# Patient Record
Sex: Male | Born: 1970 | Race: Black or African American | Hispanic: No | Marital: Single | Smoking: Never smoker
Health system: Southern US, Community
[De-identification: ages and names within clinical notes are randomized; demographics above are authoritative.]

## PROBLEM LIST (undated history)

## (undated) DIAGNOSIS — I420 Dilated cardiomyopathy: Secondary | ICD-10-CM

## (undated) DIAGNOSIS — E1169 Type 2 diabetes mellitus with other specified complication: Secondary | ICD-10-CM

## (undated) DIAGNOSIS — I10 Essential (primary) hypertension: Secondary | ICD-10-CM

## (undated) DIAGNOSIS — R0609 Other forms of dyspnea: Secondary | ICD-10-CM

## (undated) DIAGNOSIS — E669 Obesity, unspecified: Secondary | ICD-10-CM

## (undated) DIAGNOSIS — I509 Heart failure, unspecified: Secondary | ICD-10-CM

## (undated) DIAGNOSIS — R0600 Dyspnea, unspecified: Secondary | ICD-10-CM

## (undated) HISTORY — DX: Essential (primary) hypertension: I10

---

## 2004-07-14 ENCOUNTER — Ambulatory Visit: Payer: Self-pay | Admitting: Family Medicine

## 2004-07-16 ENCOUNTER — Ambulatory Visit: Payer: Self-pay | Admitting: Family Medicine

## 2005-12-01 ENCOUNTER — Emergency Department (HOSPITAL_COMMUNITY): Admission: EM | Admit: 2005-12-01 | Discharge: 2005-12-01 | Payer: Self-pay | Admitting: Emergency Medicine

## 2005-12-10 ENCOUNTER — Emergency Department (HOSPITAL_COMMUNITY): Admission: EM | Admit: 2005-12-10 | Discharge: 2005-12-10 | Payer: Self-pay | Admitting: Emergency Medicine

## 2008-07-22 ENCOUNTER — Emergency Department (HOSPITAL_COMMUNITY): Admission: EM | Admit: 2008-07-22 | Discharge: 2008-07-22 | Payer: Self-pay | Admitting: Emergency Medicine

## 2012-07-06 ENCOUNTER — Ambulatory Visit: Payer: Self-pay | Admitting: Family Medicine

## 2012-07-06 VITALS — BP 168/118 | HR 127 | Temp 98.0°F | Resp 18 | Ht 68.5 in | Wt 228.0 lb

## 2012-07-06 DIAGNOSIS — I10 Essential (primary) hypertension: Secondary | ICD-10-CM

## 2012-07-06 DIAGNOSIS — Z0000 Encounter for general adult medical examination without abnormal findings: Secondary | ICD-10-CM

## 2012-07-06 DIAGNOSIS — Z0289 Encounter for other administrative examinations: Secondary | ICD-10-CM

## 2012-07-06 MED ORDER — METOPROLOL SUCCINATE ER 50 MG PO TB24: 50.0000 mg | ORAL_TABLET | Freq: Every day | ORAL | Status: DC

## 2012-07-06 NOTE — Patient Instructions (Signed)

## 2012-07-06 NOTE — Progress Notes (Signed)
@[email or web address] @  Patient ID: Nicholas Knight MRN: [id number], DOB: [birth year] 41 y.o. Date of Encounter: 07/06/2012, 3:24 PM  Primary Physician: No primary provider on file.  Chief Complaint: Physical (CPE)  HPI: 41 y.o. y/o male with history noted below here for CPE.  Doing well. No issues/complaints.  Review of Systems: Consitutional: No fever, chills, fatigue, night sweats, lymphadenopathy, or weight changes. Eyes: No visual changes, eye redness, or discharge. ENT/Mouth: Ears: No otalgia, tinnitus, hearing loss, discharge. Nose: No congestion, rhinorrhea, sinus pain, or epistaxis. Throat: No sore throat, post nasal drip, or teeth pain. Cardiovascular: No CP, palpitations, diaphoresis, DOE, edema, orthopnea, PND. Respiratory: No cough, hemoptysis, SOB, or wheezing. Gastrointestinal: No anorexia, dysphagia, reflux, pain, nausea, vomiting, hematemesis, diarrhea, constipation, BRBPR, or melena. Genitourinary: No dysuria, frequency, urgency, hematuria, incontinence, nocturia, decreased urinary stream, discharge, impotence, or testicular pain/masses. Musculoskeletal: No decreased ROM, myalgias, stiffness, joint swelling, or weakness. Skin: No rash, erythema, lesion changes, pain, warmth, jaundice, or pruritis. Neurological: No headache, dizziness, syncope, seizures, tremors, memory loss, coordination problems, or paresthesias. Psychological: No anxiety, depression, hallucinations, SI/HI. Endocrine: No fatigue, polydipsia, polyphagia, polyuria, or known diabetes. All other systems were reviewed and are otherwise negative.  History reviewed. No pertinent past medical history.   History reviewed. No pertinent past surgical history.  Home Meds:  Prior to Admission medications   Not on File    Allergies:  Allergies  Allergen Reactions  . Penicillins Other (See Comments)    childhood    History   Social History  . Marital Status: Single    Spouse Name: N/A    Number of Children: N/A   . Years of Education: N/A   Occupational History  . Not on file.   Social History Main Topics  . Smoking status: Never Smoker   . Smokeless tobacco: Not on file  . Alcohol Use: No  . Drug Use: No  . Sexually Active: Yes   Other Topics Concern  . Not on file   Social History Narrative  . No narrative on file    Family History  Problem Relation Age of Onset  . Cancer Mother   . Lupus Maternal Grandmother     Physical Exam: Blood pressure 168/118, pulse 127, temperature 98 F (36.7 C), temperature source Oral, resp. rate 18, height 5' 8.5" (1.74 m), weight 228 lb (103.42 kg), SpO2 98.00%.  General: Well developed, well nourished, in no acute distress. HEENT: Normocephalic, atraumatic. Conjunctiva pink, sclera non-icteric. Pupils 2 mm constricting to 1 mm, round, regular, and equally reactive to light and accomodation. EOMI. Internal auditory canal clear. TMs with good cone of light and without pathology. Nasal mucosa pink. Nares are without discharge. No sinus tenderness. Oral mucosa pink. Dentition good. Pharynx without exudate.   Neck: Supple. Trachea midline. No thyromegaly. Full ROM. No lymphadenopathy. Lungs: Clear to auscultation bilaterally without wheezes, rales, or rhonchi. Breathing is of normal effort and unlabored. Cardiovascular: RRR with S1 S2. No murmurs, rubs, or gallops appreciated. Distal pulses 2+ symmetrically. No carotid or abdominal bruits Abdomen: Soft, non-tender, non-distended with normoactive bowel sounds. No hepatosplenomegaly or masses. No rebound/guarding. No CVA tenderness. Without hernias.   Genitourinary:  circumcised male. No penile lesions. Testes descended bilaterally, and smooth without tenderness or masses.  Musculoskeletal: Full range of motion and 5/5 strength throughout. Without swelling, atrophy, tenderness, crepitus, or warmth. Extremities without clubbing, cyanosis, or edema. Calves supple. Skin: Warm and moist without erythema,  ecchymosis, wounds, or rash. Neuro: A+Ox3. CN II-XII grossly intact. Moves all extremities  spontaneously. Full sensation throughout. Normal gait. DTR 2+ throughout upper and lower extremities. Finger to nose intact. Psych:  Responds to questions appropriately with a normal affect.    Assessment/Plan:  41 y.o. y/o  male here for CPE -Toprol XL 50 daily #30 with 3 RF  with bp recheck 24 hours.  Signed, Robyn Haber, MD 07/06/2012 3:24 PM

## 2012-07-07 ENCOUNTER — Ambulatory Visit (INDEPENDENT_AMBULATORY_CARE_PROVIDER_SITE_OTHER): Payer: Self-pay | Admitting: Family Medicine

## 2012-07-07 VITALS — BP 180/130 | HR 88 | Temp 98.6°F | Resp 18 | Wt 228.0 lb

## 2012-07-07 DIAGNOSIS — I10 Essential (primary) hypertension: Secondary | ICD-10-CM

## 2012-07-07 NOTE — Patient Instructions (Addendum)
bp recheck 150/100

## 2012-07-07 NOTE — Progress Notes (Signed)
Patient is taking toprol as directed (has taken 2 so far).  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  He will follow up in 3 weeks and continue to self monitor BP

## 2015-09-08 ENCOUNTER — Inpatient Hospital Stay (HOSPITAL_COMMUNITY)
Admission: EM | Admit: 2015-09-08 | Discharge: 2015-09-11 | DRG: 638 | Disposition: A | Discharge status: Home / Self Care | Payer: BLUE CROSS/BLUE SHIELD | Attending: Family Medicine | Admitting: Family Medicine

## 2015-09-08 ENCOUNTER — Encounter (HOSPITAL_COMMUNITY): Payer: Self-pay | Admitting: Emergency Medicine

## 2015-09-08 DIAGNOSIS — E871 Hypo-osmolality and hyponatremia: Secondary | ICD-10-CM | POA: Diagnosis present

## 2015-09-08 DIAGNOSIS — J029 Acute pharyngitis, unspecified: Secondary | ICD-10-CM | POA: Diagnosis present

## 2015-09-08 DIAGNOSIS — E875 Hyperkalemia: Secondary | ICD-10-CM | POA: Diagnosis present

## 2015-09-08 DIAGNOSIS — H538 Other visual disturbances: Secondary | ICD-10-CM | POA: Diagnosis not present

## 2015-09-08 DIAGNOSIS — N179 Acute kidney failure, unspecified: Secondary | ICD-10-CM | POA: Diagnosis present

## 2015-09-08 DIAGNOSIS — Z6831 Body mass index (BMI) 31.0-31.9, adult: Secondary | ICD-10-CM

## 2015-09-08 DIAGNOSIS — E860 Dehydration: Secondary | ICD-10-CM | POA: Diagnosis present

## 2015-09-08 DIAGNOSIS — Z23 Encounter for immunization: Secondary | ICD-10-CM

## 2015-09-08 DIAGNOSIS — I10 Essential (primary) hypertension: Secondary | ICD-10-CM | POA: Diagnosis present

## 2015-09-08 DIAGNOSIS — Z880 Allergy status to penicillin: Secondary | ICD-10-CM | POA: Diagnosis not present

## 2015-09-08 DIAGNOSIS — Z833 Family history of diabetes mellitus: Secondary | ICD-10-CM | POA: Diagnosis not present

## 2015-09-08 DIAGNOSIS — Z801 Family history of malignant neoplasm of trachea, bronchus and lung: Secondary | ICD-10-CM

## 2015-09-08 DIAGNOSIS — E1110 Type 2 diabetes mellitus with ketoacidosis without coma: Secondary | ICD-10-CM

## 2015-09-08 DIAGNOSIS — E1310 Other specified diabetes mellitus with ketoacidosis without coma: Secondary | ICD-10-CM | POA: Diagnosis present

## 2015-09-08 LAB — COMPREHENSIVE METABOLIC PANEL
ALT: 41 U/L (ref 17–63)
AST: 24 U/L (ref 15–41)
Albumin: 4.1 g/dL (ref 3.5–5.0)
Alkaline Phosphatase: 110 U/L (ref 38–126)
Anion gap: 17 — ABNORMAL HIGH (ref 5–15)
BUN: 32 mg/dL — ABNORMAL HIGH (ref 6–20)
CHLORIDE: 83 mmol/L — AB (ref 101–111)
CO2: 24 mmol/L (ref 22–32)
Calcium: 9 mg/dL (ref 8.9–10.3)
Creatinine, Ser: 2.22 mg/dL — ABNORMAL HIGH (ref 0.61–1.24)
GFR, EST AFRICAN AMERICAN: 39 mL/min — AB (ref >60)
GFR, EST NON AFRICAN AMERICAN: 34 mL/min — AB (ref >60)
Glucose, Bld: 1144 mg/dL (ref 65–99)
POTASSIUM: 5.9 mmol/L — AB (ref 3.5–5.1)
SODIUM: 124 mmol/L — AB (ref 135–145)
Total Bilirubin: 1.5 mg/dL — ABNORMAL HIGH (ref 0.3–1.2)
Total Protein: 7.5 g/dL (ref 6.5–8.1)

## 2015-09-08 LAB — URINALYSIS, ROUTINE W REFLEX MICROSCOPIC
BILIRUBIN URINE: NEGATIVE
Glucose, UA: >1000 mg/dL — AB
Ketones, ur: 15 mg/dL — AB
Leukocytes, UA: NEGATIVE
NITRITE: NEGATIVE
PROTEIN: NEGATIVE mg/dL
Specific Gravity, Urine: 1.01 (ref 1.005–1.030)
pH: 5.5 (ref 5.0–8.0)

## 2015-09-08 LAB — BASIC METABOLIC PANEL
Anion gap: 13 (ref 5–15)
BUN: 27 mg/dL — AB (ref 6–20)
CO2: 32 mmol/L (ref 22–32)
Calcium: 9.6 mg/dL (ref 8.9–10.3)
Chloride: 99 mmol/L — ABNORMAL LOW (ref 101–111)
Creatinine, Ser: 1.82 mg/dL — ABNORMAL HIGH (ref 0.61–1.24)
GFR calc non Af Amer: 43 mL/min — ABNORMAL LOW (ref >60)
GFR, EST AFRICAN AMERICAN: 50 mL/min — AB (ref >60)
GLUCOSE: 310 mg/dL — AB (ref 65–99)
POTASSIUM: 4 mmol/L (ref 3.5–5.1)
SODIUM: 144 mmol/L (ref 135–145)

## 2015-09-08 LAB — I-STAT VENOUS BLOOD GAS, ED
ACID-BASE DEFICIT: 2 mmol/L (ref 0.0–2.0)
Bicarbonate: 25.2 mEq/L — ABNORMAL HIGH (ref 20.0–24.0)
O2 Saturation: 83 %
TCO2: 27 mmol/L (ref 0–100)
pCO2, Ven: 50.6 mmHg — ABNORMAL HIGH (ref 45.0–50.0)
pH, Ven: 7.306 — ABNORMAL HIGH (ref 7.250–7.300)
pO2, Ven: 53 mmHg — ABNORMAL HIGH (ref 30.0–45.0)

## 2015-09-08 LAB — URINE MICROSCOPIC-ADD ON
BACTERIA UA: NONE SEEN
RBC / HPF: NONE SEEN RBC/hpf (ref 0–5)

## 2015-09-08 LAB — I-STAT CHEM 8, ED
BUN: 35 mg/dL — ABNORMAL HIGH (ref 6–20)
CHLORIDE: 86 mmol/L — AB (ref 101–111)
Calcium, Ion: 1 mmol/L — ABNORMAL LOW (ref 1.12–1.23)
Creatinine, Ser: 1.8 mg/dL — ABNORMAL HIGH (ref 0.61–1.24)
Glucose, Bld: >700 mg/dL (ref 65–99)
HEMATOCRIT: 57 % — AB (ref 39.0–52.0)
Hemoglobin: 19.4 g/dL — ABNORMAL HIGH (ref 13.0–17.0)
POTASSIUM: 5.7 mmol/L — AB (ref 3.5–5.1)
SODIUM: 124 mmol/L — AB (ref 135–145)
TCO2: 24 mmol/L (ref 0–100)

## 2015-09-08 LAB — CBC
HCT: 49 % (ref 39.0–52.0)
Hemoglobin: 17.6 g/dL — ABNORMAL HIGH (ref 13.0–17.0)
MCH: 27.7 pg (ref 26.0–34.0)
MCHC: 35.9 g/dL (ref 30.0–36.0)
MCV: 77.2 fL — AB (ref 78.0–100.0)
PLATELETS: 238 10*3/uL (ref 150–400)
RBC: 6.35 MIL/uL — ABNORMAL HIGH (ref 4.22–5.81)
WBC: 8.8 10*3/uL (ref 4.0–10.5)

## 2015-09-08 LAB — CBG MONITORING, ED
Glucose-Capillary: >600 mg/dL (ref 65–99)
Glucose-Capillary: >600 mg/dL (ref 65–99)

## 2015-09-08 LAB — GLUCOSE, CAPILLARY
GLUCOSE-CAPILLARY: 277 mg/dL — AB (ref 65–99)
GLUCOSE-CAPILLARY: 392 mg/dL — AB (ref 65–99)
Glucose-Capillary: 487 mg/dL — ABNORMAL HIGH (ref 65–99)

## 2015-09-08 MED ORDER — SODIUM CHLORIDE 0.9 % IV BOLUS (SEPSIS): 1000.0000 mL | Freq: Once | INTRAVENOUS | Status: AC

## 2015-09-08 MED ORDER — SODIUM CHLORIDE 0.9 % IV SOLN: INTRAVENOUS | Status: AC

## 2015-09-08 MED ORDER — DEXTROSE-NACL 5-0.45 % IV SOLN
INTRAVENOUS | Status: DC
  Administered 2015-09-08: 22:00:00 via INTRAVENOUS

## 2015-09-08 MED ORDER — POTASSIUM CHLORIDE CRYS ER 20 MEQ PO TBCR
40.0000 meq | EXTENDED_RELEASE_TABLET | Freq: Once | ORAL | Status: AC
  Administered 2015-09-09: 40 meq via ORAL
  Filled 2015-09-08: qty 2

## 2015-09-08 MED ORDER — INSULIN ASPART 100 UNIT/ML ~~LOC~~ SOLN
10.0000 [IU] | Freq: Once | SUBCUTANEOUS | Status: AC
  Administered 2015-09-08: 10 [IU] via INTRAVENOUS
  Filled 2015-09-08: qty 1

## 2015-09-08 MED ORDER — HEPARIN SODIUM (PORCINE) 5000 UNIT/ML IJ SOLN
5000.0000 [IU] | Freq: Three times a day (TID) | INTRAMUSCULAR | Status: DC
  Administered 2015-09-08 – 2015-09-11 (×7): 5000 [IU] via SUBCUTANEOUS
  Filled 2015-09-08 (×7): qty 1

## 2015-09-08 MED ORDER — INSULIN REGULAR HUMAN 100 UNIT/ML IJ SOLN
INTRAMUSCULAR | Status: DC
  Administered 2015-09-08: 12.8 [IU]/h via INTRAVENOUS
  Filled 2015-09-08: qty 2.5

## 2015-09-08 MED ORDER — SODIUM CHLORIDE 0.9 % IV BOLUS (SEPSIS)
2000.0000 mL | Freq: Once | INTRAVENOUS | Status: AC
  Administered 2015-09-08: 2000 mL via INTRAVENOUS

## 2015-09-08 MED ORDER — INSULIN GLARGINE 100 UNIT/ML ~~LOC~~ SOLN
10.0000 [IU] | Freq: Every day | SUBCUTANEOUS | Status: DC
  Administered 2015-09-09: 10 [IU] via SUBCUTANEOUS
  Filled 2015-09-08 (×2): qty 0.1

## 2015-09-08 MED ORDER — INSULIN ASPART 100 UNIT/ML ~~LOC~~ SOLN
0.0000 [IU] | SUBCUTANEOUS | Status: DC
  Administered 2015-09-09 (×3): 9 [IU] via SUBCUTANEOUS

## 2015-09-08 MED ORDER — INSULIN REGULAR HUMAN 100 UNIT/ML IJ SOLN
INTRAMUSCULAR | Status: DC
  Administered 2015-09-08: 5.4 [IU]/h via INTRAVENOUS
  Filled 2015-09-08: qty 2.5

## 2015-09-08 MED ORDER — SODIUM CHLORIDE 0.9 % IV SOLN
INTRAVENOUS | Status: DC
  Administered 2015-09-08: 20:00:00 via INTRAVENOUS

## 2015-09-08 NOTE — ED Notes (Signed)
Istat venous blood gas PH   7.306 PCO2 50.6 P02 53 BE,B -2 HCO3 25.2 TCO2 27 sO2 83%

## 2015-09-08 NOTE — H&P (Signed)
[facility] Admission History and Physical Service Pager: [phone]  Patient name: Nicholas Knight Medical record number: [id number] Date of birth: 12-27-1970 Age: 45 y.o. Gender: male  Primary Care Provider: No primary care provider on file. Consultants: None Code Status: FULL  Chief Complaint: Blurry vision  Assessment and Plan: Nicholas Knight is a 45 y.o. male presenting with hyperglycemia, found to be in mild DKA. PMH is significant for HTN.   1. DKA: newly diagnosed Type II DM. Initial CBG 1144, anion gap 17, pH 7.3. Also with hyponatremia (Na 124) and hyperkalemia (5.9). Denies recent illness or any other potentially precipitating factors. A&Ox3. Still with some blurry vision but no symptoms otherwise.  - Admit to stepdown for monitoring, attending Dr. Erin Hearing - Continue insulin drip via glucomander; will transition to subq insulin when able - Repeat CBG, labs via DKA protocol - F/u A1C - trend BMPs  2. AKI: likely 2/2 dehydration from osmotic diuresis. Cr 2.2 on admission. No prior values for comparison.  - Cont IVF hydration - AM BMP  3. HTN: initially treated with metoprolol, then lisinopril, although has not taken medication in at least three months. Normotensive in ED.  - Monitor BP  FEN/GI: NPO until transitioned off insulin drip; NS@125  mL/hr Prophylaxis: subQ heparin  Disposition: admit to stepdown   History of Present Illness:  Nicholas Knight is a 45 y.o. male presenting with dizziness and weakness, found to be in DKA.   Patient reports that for the past 5 days, he has felt dizzy and has been extremely thirsty. He also endorses increased urinary frequency, decreased appetite, and blurry vision. His dizziness worsened today, so he decided to go to urgent care. At urgent care, he was found to have a blood glucose too high to be measured, so he was sent to [facility] for further work-up. He has no prior diagnosis of diabetes. Report some  rhinorrhea in the past few days but denies cough or fever.    In [facility] ED, patient was found to be in DKA. He was subsequently admitted for further management.   Patient has history of HTN although reports he has not taken any medication for this in at least three months. He has not been seen by a PCP in the past three years.  Review Of Systems: Per HPI with the following additions: no nausea, vomiting, diarrhea, constipation, headache, numbness, tingling; endorses 10 pound weight loss.  Otherwise the remainder of the systems were negative.  Patient Active Problem List   Diagnosis Date Noted  . DKA (diabetic ketoacidosis) ([place]) 09/08/2015  . Hypertension 07/07/2012   Past Medical History: Past Medical History  Diagnosis Date  . Hypertension     Past Surgical History: History reviewed. No pertinent past surgical history.  Social History: Social History  Substance Use Topics  . Smoking status: Never Smoker   . Smokeless tobacco: None  . Alcohol Use: No   Please also refer to relevant sections of EMR.  Family History: Family History  Problem Relation Age of Onset  . Cancer Mother   . Lupus Maternal Grandmother    Type II DM - mother, father, younger brother MI - father (age unknown) Lung cancer - mother (former smoker)  Allergies and Medications: Allergies  Allergen Reactions  . Penicillins Other (See Comments)    childhood   No current facility-administered medications on file prior to encounter.   No current outpatient prescriptions on file prior to encounter.    Objective: BP 123/84 mmHg  Pulse 100  Temp(Src) 97.8 F (36.6 C) (Oral)  Resp 18  Ht 5\' 7"  (1.702 m)  Wt 200 lb (90.719 kg)  BMI 31.32 kg/m2  SpO2 91% Exam: General: well-appearing, well-nourished; resting comfortably in bed in NAD; family member at bedside Eyes: PERRLA ENTM: dry mouth, no oropharyngeal erythema or exudates Neck: supple, FROM, no lymphadenopathy Cardiovascular: RRR, no  murmurs appreciated Respiratory: CTAB, normal work of breathing, no wheezes or rhonchi Abdomen: soft, non-tender, non-distended, +BS MSK: WWP, 5/5 strength upper and lower extremities bilaterally, DP pulses present bilaterally Skin: dry flaking skin on both feet but no ulcers or rashes noted Neuro: A&Ox3, CN II-XII grossly intact Psych: Appropriate mood and affect  Labs and Imaging: CBC BMET   Recent Labs Lab 09/08/15 1325 09/08/15 1330  WBC 8.8  --   HGB 17.6* 19.4*  HCT 49.0 57.0*  PLT 238  --     Recent Labs Lab 09/08/15 1325 09/08/15 1330  NA 124* 124*  K 5.9* 5.7*  CL 83* 86*  CO2 24  --   BUN 32* 35*  CREATININE 2.22* 1.80*  GLUCOSE 1144* >700*  CALCIUM 9.0  --       Recent Labs Lab 09/08/15 1317 09/08/15 1545 09/08/15 1651 09/08/15 1830 09/08/15 [place] >600* >600* >600* >600* >600*     Verner Mould, MD 09/08/2015, 5:52 PM PGY-1, [place] [facility] pager: [phone], text pages welcome  I have read and agree with the amended note as above.  Phill Myron, MD, PGY-3 8:06 PM

## 2015-09-08 NOTE — ED Notes (Signed)
Attempted report 

## 2015-09-08 NOTE — ED Notes (Signed)
Pt sent here from Urgent care with ketones in urine and CBG was high--- has been drinking a lot of water, urinating more, lightheaded.

## 2015-09-09 LAB — BASIC METABOLIC PANEL
ANION GAP: 12 (ref 5–15)
ANION GAP: 13 (ref 5–15)
ANION GAP: 12 (ref 5–15)
BUN: 24 mg/dL — ABNORMAL HIGH (ref 6–20)
BUN: 23 mg/dL — ABNORMAL HIGH (ref 6–20)
BUN: 25 mg/dL — AB (ref 6–20)
CALCIUM: 8.6 mg/dL — AB (ref 8.9–10.3)
CO2: 32 mmol/L (ref 22–32)
CO2: 28 mmol/L (ref 22–32)
CO2: 23 mmol/L (ref 22–32)
Calcium: 9.4 mg/dL (ref 8.9–10.3)
Calcium: 8.6 mg/dL — ABNORMAL LOW (ref 8.9–10.3)
Chloride: 100 mmol/L — ABNORMAL LOW (ref 101–111)
Chloride: 99 mmol/L — ABNORMAL LOW (ref 101–111)
Chloride: 99 mmol/L — ABNORMAL LOW (ref 101–111)
Creatinine, Ser: 1.58 mg/dL — ABNORMAL HIGH (ref 0.61–1.24)
Creatinine, Ser: 1.65 mg/dL — ABNORMAL HIGH (ref 0.61–1.24)
Creatinine, Ser: 1.6 mg/dL — ABNORMAL HIGH (ref 0.61–1.24)
GFR calc Af Amer: 58 mL/min — ABNORMAL LOW (ref >60)
GFR, EST AFRICAN AMERICAN: 59 mL/min — AB (ref >60)
GFR, EST AFRICAN AMERICAN: 56 mL/min — AB (ref >60)
GFR, EST NON AFRICAN AMERICAN: 51 mL/min — AB (ref >60)
GFR, EST NON AFRICAN AMERICAN: 48 mL/min — AB (ref >60)
GFR, EST NON AFRICAN AMERICAN: 50 mL/min — AB (ref >60)
GLUCOSE: 151 mg/dL — AB (ref 65–99)
GLUCOSE: 438 mg/dL — AB (ref 65–99)
Glucose, Bld: 362 mg/dL — ABNORMAL HIGH (ref 65–99)
POTASSIUM: 3.8 mmol/L (ref 3.5–5.1)
POTASSIUM: 5.3 mmol/L — AB (ref 3.5–5.1)
Potassium: 4.7 mmol/L (ref 3.5–5.1)
SODIUM: 144 mmol/L (ref 135–145)
SODIUM: 140 mmol/L (ref 135–145)
Sodium: 134 mmol/L — ABNORMAL LOW (ref 135–145)

## 2015-09-09 LAB — GLUCOSE, CAPILLARY
GLUCOSE-CAPILLARY: 199 mg/dL — AB (ref 65–99)
GLUCOSE-CAPILLARY: 327 mg/dL — AB (ref 65–99)
GLUCOSE-CAPILLARY: 356 mg/dL — AB (ref 65–99)
GLUCOSE-CAPILLARY: 402 mg/dL — AB (ref 65–99)
Glucose-Capillary: 162 mg/dL — ABNORMAL HIGH (ref 65–99)
Glucose-Capillary: 416 mg/dL — ABNORMAL HIGH (ref 65–99)
Glucose-Capillary: 451 mg/dL — ABNORMAL HIGH (ref 65–99)

## 2015-09-09 LAB — MRSA PCR SCREENING: MRSA BY PCR: NEGATIVE

## 2015-09-09 MED ORDER — INSULIN GLARGINE 100 UNIT/ML ~~LOC~~ SOLN
20.0000 [IU] | Freq: Every day | SUBCUTANEOUS | Status: DC
  Administered 2015-09-09: 20 [IU] via SUBCUTANEOUS
  Filled 2015-09-09 (×3): qty 0.2

## 2015-09-09 MED ORDER — LIVING WELL WITH DIABETES BOOK
Freq: Once | Status: AC
  Administered 2015-09-09: 09:00:00
  Filled 2015-09-09: qty 1

## 2015-09-09 MED ORDER — INSULIN GLARGINE 100 UNIT/ML ~~LOC~~ SOLN
10.0000 [IU] | Freq: Once | SUBCUTANEOUS | Status: AC
  Administered 2015-09-09: 10 [IU] via SUBCUTANEOUS
  Filled 2015-09-09: qty 0.1

## 2015-09-09 MED ORDER — SODIUM CHLORIDE 0.9 % IV SOLN
INTRAVENOUS | Status: DC
  Administered 2015-09-09 – 2015-09-10 (×3): via INTRAVENOUS
  Filled 2015-09-09: qty 1000

## 2015-09-09 MED ORDER — PNEUMOCOCCAL VAC POLYVALENT 25 MCG/0.5ML IJ INJ
0.5000 mL | INJECTION | INTRAMUSCULAR | Status: AC
  Administered 2015-09-10: 0.5 mL via INTRAMUSCULAR
  Filled 2015-09-09: qty 0.5

## 2015-09-09 MED ORDER — SODIUM CHLORIDE 0.9 % IV BOLUS (SEPSIS)
1000.0000 mL | Freq: Once | INTRAVENOUS | Status: AC
  Administered 2015-09-09: 1000 mL via INTRAVENOUS

## 2015-09-09 MED ORDER — INSULIN ASPART 100 UNIT/ML ~~LOC~~ SOLN
0.0000 [IU] | Freq: Three times a day (TID) | SUBCUTANEOUS | Status: DC
  Administered 2015-09-09: 15 [IU] via SUBCUTANEOUS
  Administered 2015-09-10: 5 [IU] via SUBCUTANEOUS

## 2015-09-09 MED ORDER — METFORMIN HCL 500 MG PO TABS
500.0000 mg | ORAL_TABLET | Freq: Two times a day (BID) | ORAL | Status: DC
  Administered 2015-09-09: 500 mg via ORAL
  Filled 2015-09-09: qty 1

## 2015-09-09 MED ORDER — INSULIN GLARGINE 100 UNIT/ML ~~LOC~~ SOLN
5.0000 [IU] | Freq: Once | SUBCUTANEOUS | Status: DC
  Filled 2015-09-09: qty 0.05

## 2015-09-09 MED ORDER — INFLUENZA VAC SPLIT QUAD 0.5 ML IM SUSY
0.5000 mL | PREFILLED_SYRINGE | INTRAMUSCULAR | Status: AC
  Administered 2015-09-10: 0.5 mL via INTRAMUSCULAR
  Filled 2015-09-09: qty 0.5

## 2015-09-09 NOTE — Progress Notes (Addendum)
Inpatient Diabetes Program Recommendations  AACE/ADA: New Consensus Statement on Inpatient Glycemic Control (2015)  Target Ranges:  Prepandial:   less than 140 mg/dL      Peak postprandial:   less than 180 mg/dL (1-2 hours)      Critically ill patients:  140 - 180 mg/dL   Results for MAHD, CLICK (MRN [id number]) as of 09/09/2015 08:19  Ref. Range 09/09/2015 00:37 09/09/2015 01:39 09/09/2015 03:26 09/09/2015 07:28  Glucose-Capillary Latest Ref Range: 65-99 mg/dL 199 (H) 162 (H) 327 (H) 356 (H)   Review of Glycemic Control  Diabetes history: None Current orders for Inpatient glycemic control: Lantus 10 units, Novolog Sensitive Q4hrs  Inpatient Diabetes Program Recommendations:  Note patient transitioned off insulin gtt around midnight. Patient did not overlap insulin gtt with administration of basal insulin long enough. Patient was still requiring several units of insulin an hour on the gtt even though labs looked ok. Insulin - Basal: Fasting 356 mg/dl, please consider increasing basal insulin to at least Lantus 20 units Q24 hrs. may need additional units given this am.  Awaiting A1c to see what patient will need to be discharged on insulin vs.orals and to pinpoint DM teaching. Orders placed for RN to start DM educational videos and teaching DM survival skills for discharge: s/s hypoglycemia and treatment s/s hyperglycemia and treatment A1c results, goal is 7% or less (150 avg glucose) New medications they will be on Check glucose and how often (1-2 times for oral meds, 3-4 times a day with insulin) Teach how to administer insulin with each insulin administration dose scheduled Watching carb intake ( 60-75 g/meal, 15-30 g/snack), plate method, watching beverage options.  Thanks,  Tama Headings RN, MSN, [facility] Inpatient Diabetes Coordinator Team Pager [phone] (8a-5p)

## 2015-09-09 NOTE — Progress Notes (Signed)
NURSING PROGRESS NOTE  Nicholas Knight [id number] Transfer Data: 09/09/2015 12:00 PM Attending Provider: Lind Covert, MD PCP:No primary care provider on file. Code Status: FULL  Nicholas Knight is a 45 y.o. male patient transferred from [name]  -No acute distress noted.  -No complaints of shortness of breath.  -No complaints of chest pain.   Cardiac Monitoring: Box # 12 in place.   Blood pressure 132/63, pulse 115, temperature 98.4 F (36.9 C), temperature source Oral, resp. rate 19, height 5\' 7"  (1.702 m), weight 90.719 kg (200 lb), SpO2 97 %.   IV Fluids:  IV in place, SL Allergies:  Penicillins  Past Medical History:   has a past medical history of Hypertension.  Past Surgical History:   has no past surgical history on file.  Social History:   reports that he has never smoked. He does not have any smokeless tobacco history on file. He reports that he does not drink alcohol or use illicit drugs.  Skin: Intact  Patient/Family orientated to room. Information packet given to patient/family. Admission inpatient armband information verified with patient/family to include name and date of birth and placed on patient arm. Side rails up x 2, fall assessment and education completed with patient/family. Patient/family able to verbalize understanding of risk associated with falls and verbalized understanding to call for assistance before getting out of bed. Call light within reach. Patient/family able to voice and demonstrate understanding of unit orientation instructions.    Will continue to evaluate and treat per MD orders.

## 2015-09-09 NOTE — Plan of Care (Signed)
Problem: Food- and Nutrition-Related Knowledge Deficit (NB-1.1) Goal: Nutrition education Formal process to instruct or train a patient/client in a skill or to impart knowledge to help patients/clients voluntarily manage or modify food choices and eating behavior to maintain or improve health. Outcome: Completed/Met Date Met:  09/09/15  RD consulted for nutrition education regarding diabetes.   No results found for: HGBA1C  RD provided "Carbohydrate Counting for People with Diabetes" handout from the Academy of Nutrition and Dietetics. Discussed different food groups and their effects on blood sugar, emphasizing carbohydrate-containing foods. Provided list of carbohydrates and recommended serving sizes of common foods.  Discussed importance of controlled and consistent carbohydrate intake throughout the day. Provided examples of ways to balance meals/snacks and encouraged intake of high-fiber, whole grain complex carbohydrates. Teach back method used.  Expect good compliance. [facility]  Body mass index is 31.32 kg/(m^2). Pt meets criteria for obesity based on current BMI.  Current diet order is CHO modified, patient is consuming approximately 100% of meals at this time. Labs and medications reviewed. No further nutrition interventions warranted at this time. RD contact information provided. If additional nutrition issues arise, please re-consult RD.  Molli Barrows, RD, LDN, [place] Pager [phone] After Hours Pager [phone]

## 2015-09-09 NOTE — Care Management Note (Signed)
Case Management Note  Patient Details  Name: Nicholas Knight MRN: [id number] Date of Birth: 23-Jun-1971  Subjective/Objective:   Patient is from home with spouse, NCM awaiting benefit check for lantus and levemir to see if covered ,if so what is co pay ,patient transferred to floor,will inform NCM .                 Action/Plan: DKA, AKI, HTN, Blurry Vision, patient for possible dc today, awaiting benefit check.  Expected Discharge Date:                  Expected Discharge Plan:  Home/Self Care  In-House Referral:     Discharge planning Services  CM Consult  Post Acute Care Choice:    Choice offered to:     DME Arranged:    DME Agency:     HH Arranged:    HH Agency:     Status of Service:  In process, will continue to follow  Medicare Important Message Given:    Date Medicare IM Given:    Medicare IM give by:    Date Additional Medicare IM Given:    Additional Medicare Important Message give by:     If discussed at [place] of Stay Meetings, dates discussed:    Additional Comments:  Zenon Mayo, RN 09/09/2015, 11:07 AM

## 2015-09-09 NOTE — ED Provider Notes (Signed)
CSN: [id number]     Arrival date & time 09/08/15  1221 History   First MD Initiated Contact with Patient 09/08/15 1405     Chief Complaint  Patient presents with  . Hyperglycemia      HPI Patient presents with a 3 to four-day history of increasing thirst and urination.  Patient has no history of diabetes mellitus.  Patient has several members of his family with diabetes.  Patient has no significant medical history other than hypertension.  Patient denies abdominal pain nausea vomiting or fever. Past Medical History  Diagnosis Date  . Hypertension    History reviewed. No pertinent past surgical history. Family History  Problem Relation Age of Onset  . Cancer Mother   . Lupus Maternal Grandmother    Social History  Substance Use Topics  . Smoking status: Never Smoker   . Smokeless tobacco: None  . Alcohol Use: No    Review of Systems  Unable to perform ROS: Acuity of condition      Allergies  Penicillins  Home Medications   Prior to Admission medications   Not on File   BP 143/88 mmHg  Pulse 95  Temp(Src) 98.6 F (37 C) (Oral)  Resp 18  Ht 5\' 7"  (1.702 m)  Wt 200 lb (90.719 kg)  BMI 31.32 kg/m2  SpO2 94% Physical Exam  Constitutional: He is oriented to person, place, and time. He appears well-developed and well-nourished. No distress.  HENT:  Head: Normocephalic and atraumatic.  Eyes: Pupils are equal, round, and reactive to light.  Neck: Normal range of motion.  Cardiovascular: Intact distal pulses.  Tachycardia present.   Pulmonary/Chest: No respiratory distress.  Abdominal: Normal appearance. He exhibits no distension. There is no tenderness. There is no rebound.  Musculoskeletal: Normal range of motion.  Neurological: He is alert and oriented to person, place, and time. No cranial nerve deficit.  Skin: Skin is warm and dry. No rash noted.  Psychiatric: He has a normal mood and affect. His behavior is normal.  Nursing note and vitals reviewed.   ED  Course  Procedures (including critical care time) CRITICAL CARE Performed by: Leonard Schwartz L Total critical care time: 30 minutes Critical care time was exclusive of separately billable procedures and treating other patients. Critical care was necessary to treat or prevent imminent or life-threatening deterioration. Critical care was time spent personally by me on the following activities: development of treatment plan with patient and/or surrogate as well as nursing, discussions with consultants, evaluation of patient's response to treatment, examination of patient, obtaining history from patient or surrogate, ordering and performing treatments and interventions, ordering and review of laboratory studies, ordering and review of radiographic studies, pulse oximetry and re-evaluation of patient's condition.  Labs Review   Results for orders placed or performed during the hospital encounter of 09/08/15  MRSA PCR Screening  Result Value Ref Range   MRSA by PCR NEGATIVE NEGATIVE  CBC  Result Value Ref Range   WBC 8.8 4.0 - 10.5 K/uL   RBC 6.35 (H) 4.22 - 5.81 MIL/uL   Hemoglobin 17.6 (H) 13.0 - 17.0 g/dL   HCT 49.0 39.0 - 52.0 %   MCV 77.2 (L) 78.0 - 100.0 fL   MCH 27.7 26.0 - 34.0 pg   MCHC 35.9 30.0 - 36.0 g/dL   Platelets 238 150 - 400 K/uL  Urinalysis, Routine w reflex microscopic (not at [facility])  Result Value Ref Range   Color, Urine COLORLESS (A) YELLOW   APPearance CLEAR CLEAR  Specific Gravity, Urine 1.010 1.005 - 1.030   pH 5.5 5.0 - 8.0   Glucose, UA >1000 (A) NEGATIVE mg/dL   Hgb urine dipstick TRACE (A) NEGATIVE   Bilirubin Urine NEGATIVE NEGATIVE   Ketones, ur 15 (A) NEGATIVE mg/dL   Protein, ur NEGATIVE NEGATIVE mg/dL   Nitrite NEGATIVE NEGATIVE   Leukocytes, UA NEGATIVE NEGATIVE  Comprehensive metabolic panel  Result Value Ref Range   Sodium 124 (L) 135 - 145 mmol/L   Potassium 5.9 (H) 3.5 - 5.1 mmol/L   Chloride 83 (L) 101 - 111 mmol/L   CO2 24 22 - 32 mmol/L    Glucose, Bld 1144 (HH) 65 - 99 mg/dL   BUN 32 (H) 6 - 20 mg/dL   Creatinine, Ser 2.22 (H) 0.61 - 1.24 mg/dL   Calcium 9.0 8.9 - 10.3 mg/dL   Total Protein 7.5 6.5 - 8.1 g/dL   Albumin 4.1 3.5 - 5.0 g/dL   AST 24 15 - 41 U/L   ALT 41 17 - 63 U/L   Alkaline Phosphatase 110 38 - 126 U/L   Total Bilirubin 1.5 (H) 0.3 - 1.2 mg/dL   GFR calc non Af Amer 34 (L) >60 mL/min   GFR calc Af Amer 39 (L) >60 mL/min   Anion gap 17 (H) 5 - 15  Urine microscopic-add on  Result Value Ref Range   Squamous Epithelial / LPF 0-5 (A) NONE SEEN   WBC, UA 0-5 0 - 5 WBC/hpf   RBC / HPF NONE SEEN 0 - 5 RBC/hpf   Bacteria, UA NONE SEEN NONE SEEN  Glucose, capillary  Result Value Ref Range   Glucose-Capillary 487 (H) 65 - 99 mg/dL  Basic metabolic panel  Result Value Ref Range   Sodium 144 135 - 145 mmol/L   Potassium 4.0 3.5 - 5.1 mmol/L   Chloride 99 (L) 101 - 111 mmol/L   CO2 32 22 - 32 mmol/L   Glucose, Bld 310 (H) 65 - 99 mg/dL   BUN 27 (H) 6 - 20 mg/dL   Creatinine, Ser 1.82 (H) 0.61 - 1.24 mg/dL   Calcium 9.6 8.9 - 10.3 mg/dL   GFR calc non Af Amer 43 (L) >60 mL/min   GFR calc Af Amer 50 (L) >60 mL/min   Anion gap 13 5 - 15  Basic metabolic panel  Result Value Ref Range   Sodium 144 135 - 145 mmol/L   Potassium 3.8 3.5 - 5.1 mmol/L   Chloride 100 (L) 101 - 111 mmol/L   CO2 32 22 - 32 mmol/L   Glucose, Bld 151 (H) 65 - 99 mg/dL   BUN 24 (H) 6 - 20 mg/dL   Creatinine, Ser 1.58 (H) 0.61 - 1.24 mg/dL   Calcium 9.4 8.9 - 10.3 mg/dL   GFR calc non Af Amer 51 (L) >60 mL/min   GFR calc Af Amer 59 (L) >60 mL/min   Anion gap 12 5 - 15  Basic metabolic panel  Result Value Ref Range   Sodium 140 135 - 145 mmol/L   Potassium 5.3 (H) 3.5 - 5.1 mmol/L   Chloride 99 (L) 101 - 111 mmol/L   CO2 28 22 - 32 mmol/L   Glucose, Bld 362 (H) 65 - 99 mg/dL   BUN 23 (H) 6 - 20 mg/dL   Creatinine, Ser 1.65 (H) 0.61 - 1.24 mg/dL   Calcium 8.6 (L) 8.9 - 10.3 mg/dL   GFR calc non Af Amer 48 (L) >60 mL/min  GFR calc Af Amer 56 (L) >60 mL/min   Anion gap 13 5 - 15  Glucose, capillary  Result Value Ref Range   Glucose-Capillary 277 (H) 65 - 99 mg/dL  Glucose, capillary  Result Value Ref Range   Glucose-Capillary 392 (H) 65 - 99 mg/dL  Glucose, capillary  Result Value Ref Range   Glucose-Capillary 199 (H) 65 - 99 mg/dL  Glucose, capillary  Result Value Ref Range   Glucose-Capillary 162 (H) 65 - 99 mg/dL  Glucose, capillary  Result Value Ref Range   Glucose-Capillary 327 (H) 65 - 99 mg/dL  Glucose, capillary  Result Value Ref Range   Glucose-Capillary 356 (H) 65 - 99 mg/dL   Comment 1 Notify RN    Comment 2 Document in Chart   Glucose, capillary  Result Value Ref Range   Glucose-Capillary 402 (H) 65 - 99 mg/dL  Basic metabolic panel  Result Value Ref Range   Sodium 134 (L) 135 - 145 mmol/L   Potassium 4.7 3.5 - 5.1 mmol/L   Chloride 99 (L) 101 - 111 mmol/L   CO2 23 22 - 32 mmol/L   Glucose, Bld 438 (H) 65 - 99 mg/dL   BUN 25 (H) 6 - 20 mg/dL   Creatinine, Ser 1.60 (H) 0.61 - 1.24 mg/dL   Calcium 8.6 (L) 8.9 - 10.3 mg/dL   GFR calc non Af Amer 50 (L) >60 mL/min   GFR calc Af Amer 58 (L) >60 mL/min   Anion gap 12 5 - 15  Glucose, capillary  Result Value Ref Range   Glucose-Capillary 416 (H) 65 - 99 mg/dL  Glucose, capillary  Result Value Ref Range   Glucose-Capillary 451 (H) 65 - 99 mg/dL  CBG monitoring, ED  Result Value Ref Range   Glucose-Capillary >600 (HH) 65 - 99 mg/dL  I-Stat Chem 8, ED  Result Value Ref Range   Sodium 124 (L) 135 - 145 mmol/L   Potassium 5.7 (H) 3.5 - 5.1 mmol/L   Chloride 86 (L) 101 - 111 mmol/L   BUN 35 (H) 6 - 20 mg/dL   Creatinine, Ser 1.80 (H) 0.61 - 1.24 mg/dL   Glucose, Bld >700 (HH) 65 - 99 mg/dL   Calcium, Ion 1.00 (L) 1.12 - 1.23 mmol/L   TCO2 24 0 - 100 mmol/L   Hemoglobin 19.4 (H) 13.0 - 17.0 g/dL   HCT 57.0 (H) 39.0 - 52.0 %   Comment NOTIFIED PHYSICIAN   CBG monitoring, ED  Result Value Ref Range   Glucose-Capillary >600 (HH)  65 - 99 mg/dL  I-Stat venous blood gas, ED  Result Value Ref Range   pH, Ven 7.306 (H) 7.250 - 7.300   pCO2, Ven 50.6 (H) 45.0 - 50.0 mmHg   pO2, Ven 53.0 (H) 30.0 - 45.0 mmHg   Bicarbonate 25.2 (H) 20.0 - 24.0 mEq/L   TCO2 27 0 - 100 mmol/L   O2 Saturation 83.0 %   Acid-base deficit 2.0 0.0 - 2.0 mmol/L   Patient temperature HIDE    Sample type VENOUS   CBG monitoring, ED  Result Value Ref Range   Glucose-Capillary >600 (HH) 65 - 99 mg/dL  CBG monitoring, ED  Result Value Ref Range   Glucose-Capillary >600 (HH) 65 - 99 mg/dL  CBG monitoring, ED  Result Value Ref Range   Glucose-Capillary >600 (HH) 65 - 99 mg/dL   Comment 1 Notify RN    No results found.      MDM   Final diagnoses:  Diabetic ketoacidosis without coma associated with type 2 diabetes mellitus (HCC)        Leonard Schwartz, MD 09/09/15 [phone]

## 2015-09-09 NOTE — Progress Notes (Signed)
Report called pt to be transferred to [id number] via w/c with belongings.

## 2015-09-09 NOTE — Care Management Note (Signed)
Case Management Note  Patient Details  Name: Nicholas Knight MRN: [id number] Date of Birth: [birth year]  Subjective/Objective:                 Date- 09-09-15 Initial Assessment Patient transferred from 3S Spoke with patient at the bedside along with fiance.  Introduced self as Tourist information centre manager and explained role in discharge planning and how to be reached.  Verified patient lives in  [place] in a home with fiance.  Verified patient anticipates to go home with fiance.  Patient has no DME. Expressed potential need for no other DME.  Patient denied  needing help with their medication.  Patient drives to MD appointments.  Verified patient has No PCP, but would like to follow up with [facility], knows that he will be seen by residents. Patient states they currently receive [facility] services through no one.   Admission Comments: New diagnosis DM, Adm with DKA   Carles Collet RN BSN CM [phone]    Action/Plan:  Spoke to patient about Reli-on meter and supplies through [place], Following up at [facility], benefit check in for Levemir vs. Lantus.  Expected Discharge Date:                  Expected Discharge Plan:  Home/Self Care  In-House Referral:     Discharge planning Services  CM Consult  Post Acute Care Choice:    Choice offered to:     DME Arranged:    DME Agency:     HH Arranged:    HH Agency:     Status of Service:  In process, will continue to follow  Medicare Important Message Given:    Date Medicare IM Given:    Medicare IM give by:    Date Additional Medicare IM Given:    Additional Medicare Important Message give by:     If discussed at [place] of Stay Meetings, dates discussed:    Additional Comments:  Carles Collet, RN 09/09/2015, 11:54 AM

## 2015-09-09 NOTE — Progress Notes (Signed)
Family Medicine Teaching Service Daily Progress Note Intern Pager: [phone]  Patient name: Nicholas Knight Medical record number: [id number] Date of birth: 11-04-1970 Age: 45 y.o. Gender: male  Primary Care Provider: No primary care provider on file. Consultants: none Code Status: full  Pt Overview and Major Events to Date:  2/5: Admit to inpatient FPTS  Assessment and Plan: Celia Eltz is a 45 y.o. male presenting with hyperglycemia, found to be in mild DKA. PMH is significant for HTN.   1. DKA: Resolved. Anion gap closed x 2. Last glucose 356.  A&Ox3. Still with some blurry vision but no symptoms otherwise.  - Insulin: 10 U Lantus with sensitive SSI. Will tailor insulin regimen as needed - Begin Metformin 500 mg BID - CBG q 4 - F/u A1C - trend BMPs - Diabetic education  2. AKI: likely 2/2 dehydration from osmotic diuresis. Cr 2.2 on admission, now 1.65. No prior values for comparison.  - Encourage PO hydration, transitioned to regular diet - AM BMPs  3. HTN: initially treated with metoprolol, then lisinopril, although has not taken medication in at least three months. Normotensive.  - Monitor BP  4. Blurry Vision: Likely secondary to uncontrolled diabetes - Outpatient opthalmology follow up  FEN/GI: NPO until transitioned off insulin drip; NS@125  mL/hr Prophylaxis: subQ heparin  Disposition: Home today  Subjective:  - No acute events overnight - No complaints this morning, patient feels well other than some residual blurry vision - Increased urinary frequency has improved, now having some color in his urine  Objective: Temp:  [97.8 F (36.6 C)-98.8 F (37.1 C)] 98.6 F (37 C) (02/06 0714) Pulse Rate:  [97-109] 99 (02/06 0714) Resp:  [16-20] 17 (02/06 0714) BP: (107-159)/(69-98) 121/86 mmHg (02/06 0714) SpO2:  [90 %-99 %] 99 % (02/06 0714) Weight:  [200 lb (90.719 kg)] 200 lb (90.719 kg) (02/05 1309) Physical Exam: General: In NAD, laying in bed watching tv  and eating breakfast Cardiovascular: RRR, normal s1 and s2, no murmurs Respiratory: normal work of breathing, clear to auscultation bilaterally Abdomen: soft, non distended, non tender, normal bowel sounds, no masses Extremities: no edema  Laboratory:  Recent Labs Lab 09/08/15 1325 09/08/15 1330  WBC 8.8  --   HGB 17.6* 19.4*  HCT 49.0 57.0*  PLT 238  --     Recent Labs Lab 09/08/15 1325  09/08/15 2158 09/09/15 0135 09/09/15 0548  NA 124*  < > 144 144 140  K 5.9*  < > 4.0 3.8 5.3*  CL 83*  < > 99* 100* 99*  CO2 24  --  32 32 28  BUN 32*  < > 27* 24* 23*  CREATININE 2.22*  < > 1.82* 1.58* 1.65*  CALCIUM 9.0  --  9.6 9.4 8.6*  PROT 7.5  --   --   --   --   BILITOT 1.5*  --   --   --   --   ALKPHOS 110  --   --   --   --   ALT 41  --   --   --   --   AST 24  --   --   --   --   GLUCOSE 1144*  < > 310* 151* 362*  < > = values in this interval not displayed.   Imaging/Diagnostic Tests: No results found.   Carlyle Dolly, MD 09/09/2015, 8:20 AM PGY-1, [employer] Intern pager: [phone], text pages welcome

## 2015-09-09 NOTE — Progress Notes (Addendum)
Inpatient Diabetes Program Recommendations  AACE/ADA: New Consensus Statement on Inpatient Glycemic Control (2015)  Target Ranges:  Prepandial:   less than 140 mg/dL      Peak postprandial:   less than 180 mg/dL (1-2 hours)      Critically ill patients:  140 - 180 mg/dL   Spoke with patient about new diabetes diagnosis.  Discussed pending A1C and explained what an A1C is and informed patient that his current admitting glucose and admitting symptoms indicated by the MDs pointed toward new diagnosis. Patients Mother, Father and Brother are diagnosed with DM 2. Discussed basic pathophysiology of DM Type 2, basic home care, importance of checking CBGs and maintaining good CBG control to prevent long-term and short-term complications. Reviewed glucose and A1C goals.  Reviewed signs and symptoms of hyperglycemia and hypoglycemia along with treatment for both. Discussed impact of nutrition, exercise, stress, sickness, and medications on diabetes control. Reviewed Living Well with diabetes booklet and encouraged patient to read through entire book. Informed patient about Metformin he will possibly be discharged on and the possible side effects of the medication. Asked patient to check his glucose 2 times per day if on oral medications and to keep a log book of glucose readings and insulin taken. Explained how the doctor he follows up with can use the log book to continue to make insulin adjustments if needed.  Patient verbalized wanting to go to Outpatient  Education when discharged. Will place order for education MD to cosign.  RNs to provide ongoing basic DM education at bedside with this patient and engage patient to actively check blood glucose.    MD: Will need to order glucose meter kit   1430 pm paged MD on call for additional basal.  Thanks, Tama Headings RN, MSN, [facility] Inpatient Diabetes Coordinator Team Pager [phone] (8a-5p)

## 2015-09-10 DIAGNOSIS — E1110 Type 2 diabetes mellitus with ketoacidosis without coma: Secondary | ICD-10-CM | POA: Insufficient documentation

## 2015-09-10 LAB — GLUCOSE, CAPILLARY
GLUCOSE-CAPILLARY: 366 mg/dL — AB (ref 65–99)
GLUCOSE-CAPILLARY: 349 mg/dL — AB (ref 65–99)
Glucose-Capillary: 285 mg/dL — ABNORMAL HIGH (ref 65–99)
Glucose-Capillary: 250 mg/dL — ABNORMAL HIGH (ref 65–99)
Glucose-Capillary: 209 mg/dL — ABNORMAL HIGH (ref 65–99)

## 2015-09-10 LAB — BASIC METABOLIC PANEL
Anion gap: 11 (ref 5–15)
BUN: 16 mg/dL (ref 6–20)
CHLORIDE: 103 mmol/L (ref 101–111)
CO2: 25 mmol/L (ref 22–32)
CREATININE: 1.23 mg/dL (ref 0.61–1.24)
Calcium: 8.6 mg/dL — ABNORMAL LOW (ref 8.9–10.3)
GFR calc Af Amer: >60 mL/min (ref >60)
GFR calc non Af Amer: >60 mL/min (ref >60)
Glucose, Bld: 275 mg/dL — ABNORMAL HIGH (ref 65–99)
Potassium: 4.5 mmol/L (ref 3.5–5.1)
SODIUM: 139 mmol/L (ref 135–145)

## 2015-09-10 LAB — RAPID URINE DRUG SCREEN, HOSP PERFORMED
Amphetamines: NOT DETECTED
BARBITURATES: NOT DETECTED
Benzodiazepines: NOT DETECTED
Cocaine: NOT DETECTED
Opiates: NOT DETECTED
TETRAHYDROCANNABINOL: NOT DETECTED

## 2015-09-10 LAB — HEMOGLOBIN A1C
HEMOGLOBIN A1C: 12.9 % — AB (ref 4.8–5.6)
Mean Plasma Glucose: 324 mg/dL

## 2015-09-10 MED ORDER — INSULIN DETEMIR 100 UNIT/ML ~~LOC~~ SOLN
30.0000 [IU] | Freq: Every day | SUBCUTANEOUS | Status: DC
  Filled 2015-09-10: qty 0.3

## 2015-09-10 MED ORDER — METFORMIN HCL 500 MG PO TABS: 500.0000 mg | ORAL_TABLET | Freq: Once | ORAL | Status: DC

## 2015-09-10 MED ORDER — INSULIN DETEMIR 100 UNIT/ML ~~LOC~~ SOLN
35.0000 [IU] | Freq: Every day | SUBCUTANEOUS | Status: DC
  Filled 2015-09-10 (×2): qty 0.35

## 2015-09-10 MED ORDER — PHENOL 1.4 % MT LIQD
1.0000 | OROMUCOSAL | Status: DC | PRN
  Administered 2015-09-11: 1 via OROMUCOSAL
  Filled 2015-09-10: qty 177

## 2015-09-10 MED ORDER — INSULIN ASPART 100 UNIT/ML ~~LOC~~ SOLN
0.0000 [IU] | Freq: Three times a day (TID) | SUBCUTANEOUS | Status: DC
  Administered 2015-09-10: 20 [IU] via SUBCUTANEOUS
  Administered 2015-09-10: 15 [IU] via SUBCUTANEOUS
  Administered 2015-09-11: 11 [IU] via SUBCUTANEOUS
  Administered 2015-09-11: 7 [IU] via SUBCUTANEOUS
  Administered 2015-09-11: 15 [IU] via SUBCUTANEOUS

## 2015-09-10 MED ORDER — METFORMIN HCL 500 MG PO TABS
500.0000 mg | ORAL_TABLET | Freq: Every day | ORAL | Status: DC
  Administered 2015-09-10: 500 mg via ORAL
  Filled 2015-09-10: qty 1

## 2015-09-10 MED ORDER — INSULIN STARTER KIT- PEN NEEDLES (ENGLISH)
1.0000 | Freq: Once | Status: AC
  Administered 2015-09-10: 1
  Filled 2015-09-10: qty 1

## 2015-09-10 MED ORDER — MENTHOL 3 MG MT LOZG: 1.0000 | LOZENGE | OROMUCOSAL | Status: DC | PRN

## 2015-09-10 MED ORDER — INSULIN DETEMIR 100 UNIT/ML ~~LOC~~ SOLN
40.0000 [IU] | Freq: Every day | SUBCUTANEOUS | Status: DC
  Administered 2015-09-10: 40 [IU] via SUBCUTANEOUS
  Filled 2015-09-10 (×2): qty 0.4

## 2015-09-10 MED ORDER — INSULIN GLARGINE 100 UNIT/ML ~~LOC~~ SOLN
30.0000 [IU] | Freq: Every day | SUBCUTANEOUS | Status: DC
Start: 2015-09-10 — End: 2015-09-10
  Filled 2015-09-10: qty 0.3

## 2015-09-10 MED ORDER — METFORMIN HCL 500 MG PO TABS: 500.0000 mg | ORAL_TABLET | Freq: Two times a day (BID) | ORAL | Status: DC

## 2015-09-10 MED ORDER — ACETAMINOPHEN 325 MG PO TABS: 650.0000 mg | ORAL_TABLET | Freq: Four times a day (QID) | ORAL | Status: DC | PRN

## 2015-09-10 NOTE — Progress Notes (Signed)
S/W DANIEL @ [facility] # [phone]   INSULIN:  LANTUS ( VIAL OR PEN )  NOT COVERED   INSULIN:  LEVEMIR ( VIAL OR PEN )  VIAL- $ 92.87  AND  PEN- $28.02  PRIOR APPROVAL - NO  PHARMACY - [facility]

## 2015-09-10 NOTE — Progress Notes (Signed)
Inpatient Diabetes Program Recommendations  AACE/ADA: New Consensus Statement on Inpatient Glycemic Control (2015)  Target Ranges:  Prepandial:   less than 140 mg/dL      Peak postprandial:   less than 180 mg/dL (1-2 hours)      Critically ill patients:  140 - 180 mg/dL   Looking at patient trends yesterday afternoon and overnight, patient received a total of 40 units of basal insulin within a 24 hour period to bring his fasting down to 285 mg/dl this am. On discharge patient looks like he will need Levemir Flexpen (better on renal function) 40 units Q24hrs, and possibly try Glipizide 5 mg Daily instead of metformin due to renal function. Insulin pen needles (order # [id number]), and a glucose meter kit that includes lancets and strips (order # [id number]).  Will see patient again today to show how to use insulin pen and retouch on his education from yesterday.  Thanks,  Tama Headings RN, MSN, [facility] Inpatient Diabetes Coordinator Team Pager [phone] (8a-5p)

## 2015-09-10 NOTE — Progress Notes (Signed)
Family Medicine Teaching Service Daily Progress Note Intern Pager: [phone]  Patient name: Nicholas Knight Medical record number: [id number] Date of birth: 1971-06-23 Age: 45 y.o. Gender: male  Primary Care Provider: No primary care provider on file. Consultants: none Code Status: full  Pt Overview and Major Events to Date:  2/5: Admit to inpatient FPTS  Assessment and Plan: Nicholas Knight is a 45 y.o. male presenting with hyperglycemia, found to be in mild DKA. PMH is significant for HTN.   1. DKA: Resolved. Anion gap closed x 2. Last glucose 356.  A&Ox3. Still with some blurry vision but no symptoms otherwise. Received total of 40 units of Lantus over last 24 hours and 33 units of novolog. Hgb A1C: 12.9. Glucose ranged from 285-451 over last 24 hours.  - Insulin: increase to 30 U Lantus with moderate SSI. Will tailor insulin regimen as needed - Begin Metformin 500 mg once daily (SCr 1.23 today) - CBG 4 times daily - trend BMPs - Diabetic education appreciated  2. AKI: likely 2/2 dehydration from osmotic diuresis. Cr 2.2 on admission, now 1.23. No prior values for comparison. Was put back on IVF NS @ 150 cc/hr, will decrease to 100 cc/hr this AM - Encourage PO hydration - Continue IVF until this afternoon - AM BMPs  3. HTN: initially treated with metoprolol, then lisinopril, although has not taken medication in at least three months. Normotensive.  - Monitor BP  4. Blurry Vision: Likely secondary to uncontrolled diabetes - Outpatient opthalmology follow up  5. Sore throat: Likely viral, possibly Coxsackie due to pharyngeal lesions. Started last night.  Associated with runny nose. No fever, cough, or lymphadenopathy - Chloraseptic spray and lozenges - Tylenol PRN for pain   FEN/GI: carb modified diet; NS@100  mL/hr Prophylaxis: subQ heparin   Disposition: Home once glucose under better control  Subjective:  - No acute events overnight, glucose remained elevated  - Continues  to endorse blurry vision and now sore throat - Sore throat associated with runny nose - Still has a good appetite - Would like to follow up at family medicine clinic   Objective: Temp:  [97.6 F (36.4 C)-98.6 F (37 C)] 98 F (36.7 C) (02/07 0526) Pulse Rate:  [95-115] 95 (02/07 0526) Resp:  [18-19] 18 (02/07 0526) BP: (127-143)/(63-88) 129/87 mmHg (02/07 0526) SpO2:  [91 %-98 %] 98 % (02/07 0526) Physical Exam: General: In NAD, sitting up on side of the bed HEENT: Moist mucous membranes, herpanginous lesions on pharynx, no lymphadenopathy Cardiovascular: RRR, normal s1 and s2, no murmurs Respiratory: normal work of breathing, clear to auscultation bilaterally Abdomen: soft, non distended, non tender, normal bowel sounds, no masses Extremities: no edema   Laboratory:  Recent Labs Lab 09/08/15 1325 09/08/15 1330  WBC 8.8  --   HGB 17.6* 19.4*  HCT 49.0 57.0*  PLT 238  --     Recent Labs Lab 09/08/15 1325  09/09/15 0548 09/09/15 1719 09/10/15 0753  NA 124*  < > 140 134* 139  K 5.9*  < > 5.3* 4.7 4.5  CL 83*  < > 99* 99* 103  CO2 24  < > 28 23 25   BUN 32*  < > 23* 25* 16  CREATININE 2.22*  < > 1.65* 1.60* 1.23  CALCIUM 9.0  < > 8.6* 8.6* 8.6*  PROT 7.5  --   --   --   --   BILITOT 1.5*  --   --   --   --   Nicholas Knight  110  --   --   --   --   ALT 41  --   --   --   --   AST 24  --   --   --   --   GLUCOSE 1144*  < > 362* 438* 275*  < > = values in this interval not displayed.   Imaging/Diagnostic Tests: No results found.   Carlyle Dolly, MD 09/10/2015, 7:52 AM PGY-1, [place] Intern pager: [phone], text pages welcome

## 2015-09-10 NOTE — Discharge Summary (Signed)
[facility] Discharge Summary  Patient name: Nicholas Knight Medical record number: [id number] Date of birth: 1971-04-24 Age: 45 y.o. Gender: male Date of Admission: 09/08/2015  Date of Discharge: 09/11/15 Admitting Physician: Lind Covert, MD  Primary Care Provider: No primary care provider on file. Consultants: none  Indication for Hospitalization: DKA  Discharge Diagnoses/Problem List:  Patient Active Problem List   Diagnosis Date Noted  . Diabetic ketoacidosis without coma associated with type 2 diabetes mellitus ([place])   . DKA (diabetic ketoacidosis) ([place]) 09/08/2015  . AKI (acute kidney injury) ([place]) 09/08/2015  . Hypertension 07/07/2012    Disposition: Home  Discharge Condition: Stable  Discharge Exam: see previous progress note  Brief Hospital Course:  Nicholas Knight is a 45 y.o. male who presented with DKA, newly diagnosed diabetes. PMH is significant for HTN.   Patient presented with BG of 1144 on admission with anion gap of 17, Hgb A1C of 12.9, and urinalysis remarkable for: >1000 glucose, 15 ketones, negative protein.Patient also had AKI with Scr of 2.2. Vital signs stable and afebrile. Patient was given 1 L IV fluid bolus x 3 and then placed on IVF and insulin gtt regimen per DKA protocol. He was then admitted to step down unit. Once anion gap closed per BMP, was transitioned to sub-q insulin. Was started on Lantus 10 units and novolog sliding scale insulin regimen. Pt was changed from Lantus to Levemir due to insurance coverage. Insulin was titrated up as needed and glucose decreased to low 200-300 range. SCr normalized with IVF.   It was also noted that BP was slightly elevated; low dose of Lisinopril was started. Additionally, he developed a sore throat, likely viral in origin, and was given Chloraseptic spray.  On day of discharge, patient was sent home with prescriptions for Levemir 45 units daily and Metformin 500 mg BID. Pt will  follow up with [facility] and establish care there with Dr. Juanito Doom.  Issues for Follow Up:  1. Diabetes medication: Adjust insulin regimen based on CBGs. Was sent home with prescription for Levemir 45 units daily and Metformin 500 mg BID. Patient agreed to bring glucometer readings with him to follow up visits.  2. Follow up with Dr. Valentina Lucks on 2/9 for diabetes  3. Opthalmology referral for blurry vision 4. Had a sore throat, likely viral, during hospitalization. Ensure sore throat is improving  5. Repeat BMP in 3-6 months to monitor kidney function while on Metformin and Lisinopril 6. If hypertensive may need to increase dose of Lisinopril   Significant Procedures: none  Significant Labs and Imaging:   Recent Labs Lab 09/08/15 1325 09/08/15 1330 09/11/15 0613  WBC 8.8  --  7.0  HGB 17.6* 19.4* 15.8  HCT 49.0 57.0* 46.0  PLT 238  --  208    Recent Labs Lab 09/08/15 1325  09/09/15 0135 09/09/15 0548 09/09/15 1719 09/10/15 0753 09/11/15 0613  NA 124*  < > 144 140 134* 139 139  K 5.9*  < > 3.8 5.3* 4.7 4.5 4.0  CL 83*  < > 100* 99* 99* 103 102  CO2 24  < > 32 _0 GLUCOSE 1144*  < > 151* 362* 438* 275* 211*  BUN 32*  < > 24* 23* 25* 16 11  CREATININE 2.22*  < > 1.58* 1.65* 1.60* 1.23 1.12  CALCIUM 9.0  < > 9.4 8.6* 8.6* 8.6* 9.0  ALKPHOS 110  --   --   --   --   --   --  AST 24  --   --   --   --   --   --   ALT 41  --   --   --   --   --   --   ALBUMIN 4.1  --   --   --   --   --   --   < > = values in this interval not displayed.  No results found.   Results/Tests Pending at Time of Discharge: none  Discharge Medications:    Medication List    TAKE these medications        blood glucose meter kit and supplies  Dispense based on patient and insurance preference. Use up to four times daily as directed. (FOR ICD-9 250.00, 250.01).     Insulin Detemir 100 UNIT/ML Pen  Commonly known as:  LEVEMIR  Inject 45 Units into the skin daily at  10 pm.     Insulin Pen Needle 31G X 5 MM Misc  BD Pen Needles- brand specific Inject insulin via insulin pen 6 x daily     lisinopril 2.5 MG tablet  Commonly known as:  PRINIVIL,ZESTRIL  Take 1 tablet (2.5 mg total) by mouth daily.     metFORMIN 500 MG tablet  Commonly known as:  GLUCOPHAGE  Take 1 tablet (500 mg total) by mouth 2 (two) times daily with a meal.        Discharge Instructions: Please refer to Patient Instructions section of EMR for full details.  Patient was counseled important signs and symptoms that should prompt return to medical care, changes in medications, dietary instructions, activity restrictions, and follow up appointments.   Follow-Up Appointments: Follow-up Information    Follow up with [facility] on 09/12/2015.   Specialty:  Family Medicine   Why:  Diabetes medication management with Dr. Valentina Lucks at 8:30 AM   Contact information:   [address] [id number] [place] [phone]      Follow up with [facility] .   Contact information:   Obtain these through [facility] on day of discharge or prior.      Follow up with Mercy Riding, MD On 09/13/2015.   Specialty:  Family Medicine   Why:  hospital follow up at 2:00PM   Contact information:   [place] [address] [phone]       Carlyle Dolly, MD 09/10/2015, 11:50 AM PGY-1, [place]

## 2015-09-10 NOTE — Progress Notes (Signed)
Inpatient Diabetes Program Recommendations  AACE/ADA: New Consensus Statement on Inpatient Glycemic Control (2015)  Target Ranges:  Prepandial:   less than 140 mg/dL      Peak postprandial:   less than 180 mg/dL (1-2 hours)      Critically ill patients:  140 - 180 mg/dL   Educated patient on insulin pen use at home. Reviewed contents of insulin flexpen starter kit. Reviewed all steps if insulin pen including attachment of needle, 2-unit air shot, dialing up dose, giving injection, removing needle, disposal of sharps, storage of unused insulin, disposal of insulin etc. Patient able to provide successful return demonstration. Also reviewed troubleshooting with insulin pen.   Also gave patient information on diet, hypoglycemia, Diabetes Meal Planning Guide. Patient to possibly follow up with the [facility] for possible insulin adjustments before his follow up appointment.  Thanks,  Tama Headings RN, MSN, [facility] Inpatient Diabetes Coordinator Team Pager [phone] (8a-5p)

## 2015-09-10 NOTE — Progress Notes (Signed)
Per Benefit check  S/W DANIEL @ [employer] # [phone]   LANTUS ( VIAL OR PEN )  NOT COVER   LEVEMIR ( VIAL AND PEN )   COVER- YES  VIAL - $ 92.87  AND PEN - $ 28.02

## 2015-09-11 ENCOUNTER — Inpatient Hospital Stay: Payer: BLUE CROSS/BLUE SHIELD | Admitting: Internal Medicine

## 2015-09-11 LAB — GLUCOSE, CAPILLARY
GLUCOSE-CAPILLARY: 342 mg/dL — AB (ref 65–99)
GLUCOSE-CAPILLARY: 348 mg/dL — AB (ref 65–99)
Glucose-Capillary: 217 mg/dL — ABNORMAL HIGH (ref 65–99)
Glucose-Capillary: 277 mg/dL — ABNORMAL HIGH (ref 65–99)

## 2015-09-11 LAB — BASIC METABOLIC PANEL
ANION GAP: 10 (ref 5–15)
BUN: 11 mg/dL (ref 6–20)
CALCIUM: 9 mg/dL (ref 8.9–10.3)
CO2: 27 mmol/L (ref 22–32)
Chloride: 102 mmol/L (ref 101–111)
Creatinine, Ser: 1.12 mg/dL (ref 0.61–1.24)
GFR calc Af Amer: >60 mL/min (ref >60)
GLUCOSE: 211 mg/dL — AB (ref 65–99)
Potassium: 4 mmol/L (ref 3.5–5.1)
Sodium: 139 mmol/L (ref 135–145)

## 2015-09-11 LAB — CBC
HCT: 46 % (ref 39.0–52.0)
Hemoglobin: 15.8 g/dL (ref 13.0–17.0)
MCH: 27.1 pg (ref 26.0–34.0)
MCHC: 34.3 g/dL (ref 30.0–36.0)
MCV: 78.8 fL (ref 78.0–100.0)
PLATELETS: 208 10*3/uL (ref 150–400)
RBC: 5.84 MIL/uL — ABNORMAL HIGH (ref 4.22–5.81)
RDW: 13.6 % (ref 11.5–15.5)
WBC: 7 10*3/uL (ref 4.0–10.5)

## 2015-09-11 MED ORDER — BLOOD GLUCOSE METER KIT
PACK | Status: DC
Start: 2015-09-11 — End: 2018-11-29

## 2015-09-11 MED ORDER — INSULIN DETEMIR 100 UNIT/ML ~~LOC~~ SOLN: 45.0000 [IU] | Freq: Every day | SUBCUTANEOUS | Status: DC

## 2015-09-11 MED ORDER — INSULIN DETEMIR 100 UNIT/ML ~~LOC~~ SOLN
5.0000 [IU] | Freq: Once | SUBCUTANEOUS | Status: AC
  Administered 2015-09-11: 5 [IU] via SUBCUTANEOUS
  Filled 2015-09-11: qty 0.05

## 2015-09-11 MED ORDER — METFORMIN HCL 500 MG PO TABS: 500.0000 mg | ORAL_TABLET | Freq: Two times a day (BID) | ORAL | Status: DC

## 2015-09-11 MED ORDER — METFORMIN HCL 500 MG PO TABS
500.0000 mg | ORAL_TABLET | Freq: Two times a day (BID) | ORAL | Status: DC
  Administered 2015-09-11: 500 mg via ORAL
  Filled 2015-09-11: qty 1

## 2015-09-11 MED ORDER — INSULIN PEN NEEDLE 31G X 5 MM MISC: Status: DC

## 2015-09-11 MED ORDER — INSULIN DETEMIR 100 UNIT/ML ~~LOC~~ SOLN
45.0000 [IU] | Freq: Every day | SUBCUTANEOUS | Status: DC
  Filled 2015-09-11: qty 0.45

## 2015-09-11 MED ORDER — LISINOPRIL 2.5 MG PO TABS: 2.5000 mg | ORAL_TABLET | Freq: Every day | ORAL | Status: DC

## 2015-09-11 MED ORDER — LISINOPRIL 5 MG PO TABS
2.5000 mg | ORAL_TABLET | Freq: Every day | ORAL | Status: DC
  Administered 2015-09-11: 2.5 mg via ORAL
  Filled 2015-09-11: qty 1

## 2015-09-11 MED ORDER — INSULIN DETEMIR 100 UNIT/ML FLEXPEN: 45.0000 [IU] | PEN_INJECTOR | Freq: Every day | SUBCUTANEOUS | Status: DC

## 2015-09-11 NOTE — Progress Notes (Signed)
Pt given discharge instructions, prescriptions, and care notes. Pt verbalized understanding AEB no further questions or concerns at this time. IV was discontinued, no redness, pain, or swelling noted at this time. Pt left the floor via wheelchair with staff in stable condition. 

## 2015-09-11 NOTE — Progress Notes (Signed)
Family Medicine Teaching Service Daily Progress Note Intern Pager: [phone]  Patient name: Nicholas Knight Medical record number: [id number] Date of birth: [birth year] Age: 45 y.o. Gender: male  Primary Care Provider: No primary care provider on file. Consultants: none Code Status: full  Pt Overview and Major Events to Date:  2/5: Admit to inpatient FPTS  Assessment and Plan: Nicholas Knight is a 45 y.o. male presenting with hyperglycemia, found to be in mild DKA. PMH is significant for HTN.   1. DKA, newly diagnosed Diabetes: Resolved. Anion gap closed x 2. Last glucose 356.  A&Ox3. Still with some blurry vision but no symptoms otherwise. Hgb A1C: 12.9. Received total of 40 units of Lantus over last 24 hours and 47 units of novolog. Glucose 209 last night and fasting glucose 211 this morning.  - Insulin: 40 units Levemir at night (Lantus not covered by patient's insurance), resistant SSI - Increase Metformin to 500 mg once daily  - CBG 4 times daily - Diabetic education appreciated - Likely home today  2. AKI: Resolved - Encourage PO hydration  3. HTN: initially treated with metoprolol, then lisinopril, although has not taken medication in at least three months. Normotensive most of the time, BP 156/105 this morning.  - Monitor BP - Will start low dose Lisinopril daily  4. Blurry Vision: Likely secondary to uncontrolled diabetes - Outpatient opthalmology follow up  5. Sore throat: Day 2. Likely viral, possibly Coxsackie due to pharyngeal lesions. Associated with runny nose. No fever, cough, or lymphadenopathy. Pain in throat improved with chloraseptic spray. - Chloraseptic spray and lozenges - Tylenol PRN for pain   FEN/GI: carb modified diet; SLIV Prophylaxis: subQ heparin   Disposition: Home today  Subjective:  - No acute events overnight  - Continues to endorse blurry vision and improving sore throat - Still has a good appetite but unable to eat much food due to sore  throat, is tolerating mostly liquids and some cereal - Discussed lifestyle modifications with new diagnosis of diabetes  Objective: Temp:  [98.7 F (37.1 C)-99.1 F (37.3 C)] 99.1 F (37.3 C) (02/07 2154) Pulse Rate:  [109-118] 109 (02/07 2154) Resp:  [20] 20 (02/07 2154) BP: (140-156)/(77-106) 156/106 mmHg (02/07 2154) SpO2:  [96 %-98 %] 96 % (02/07 2154) Physical Exam: General: In NAD, sitting up on side of the bed HEENT: Moist mucous membranes, herpanginous lesions on pharynx with erythema, no lymphadenopathy Cardiovascular: RRR, normal s1 and s2, no murmurs Respiratory: normal work of breathing, clear to auscultation bilaterally Abdomen: soft, non distended, non tender, normal bowel sounds, no masses Extremities: no edema   Laboratory:  Recent Labs Lab 09/08/15 1325 09/08/15 1330 09/11/15 0613  WBC 8.8  --  7.0  HGB 17.6* 19.4* 15.8  HCT 49.0 57.0* 46.0  PLT 238  --  208    Recent Labs Lab 09/08/15 1325  09/09/15 1719 09/10/15 0753 09/11/15 0613  NA 124*  < > 134* 139 139  K 5.9*  < > 4.7 4.5 4.0  CL 83*  < > 99* 103 102  CO2 24  < > 23 25 27   BUN 32*  < > 25* 16 11  CREATININE 2.22*  < > 1.60* 1.23 1.12  CALCIUM 9.0  < > 8.6* 8.6* 9.0  PROT 7.5  --   --   --   --   BILITOT 1.5*  --   --   --   --   ALKPHOS 110  --   --   --   --  ALT 41  --   --   --   --   AST 24  --   --   --   --   GLUCOSE 1144*  < > 438* 275* 211*  < > = values in this interval not displayed.   Imaging/Diagnostic Tests: No results found.   Carlyle Dolly, MD 09/11/2015, 8:18 AM PGY-1, [place] Intern pager: [phone], text pages welcome

## 2015-09-11 NOTE — Progress Notes (Signed)
Inpatient Diabetes Program Recommendations  AACE/ADA: New Consensus Statement on Inpatient Glycemic Control (2015)  Target Ranges:  Prepandial:   less than 140 mg/dL      Peak postprandial:   less than 180 mg/dL (1-2 hours)      Critically ill patients:  140 - 180 mg/dL   Results for TONI, NESCI (MRN [id number]) as of 09/11/2015 08:50  Ref. Range 09/10/2015 07:54 09/10/2015 12:41 09/10/2015 17:31 09/10/2015 21:34 09/11/2015 08:20  Glucose-Capillary Latest Ref Range: 65-99 mg/dL 250 (H) 366 (H) 349 (H) 209 (H) 217 (H)   Review of Glycemic Control  Inpatient Diabetes Program Recommendations: Insulin - Basal: Looking at patient trends and fasting glucose this am, please consider increasing basal insulin to Levemir 45 units QHS. Insulin - Meal Coverage: Noted patient received a total of 40 units of short acting insulin yesterday. Patient may have to be on meal coverage in the future at meal times.  Oral Agents: Consider increasing Metformin 500 BID.  Note even though patient is in the low 200's at goal for now due to his initial glucose levels. The patient may need the increase in basal to prevent such high meal time glucose. Metformin BID should help as well.   Thanks,  Tama Headings RN, MSN, [facility] Inpatient Diabetes Coordinator Team Pager [phone] (8a-5p)

## 2015-09-11 NOTE — Discharge Instructions (Signed)
Mr. Heinzen, you were admitted to the hospital for diabetic ketoacidosis and newly diagnosed diabetes. We have given you IV fluids and insulin. Your glucose is now in a safe range for you to go home today.   We have started you on a couple new medications and diabetic supplies which have been sent to your pharmacy. Please take as prescribed. These medications are Levemir (insulin), Lisinopril, and Metformin. I would like for you to continue checking your sugar with your new meter and bring this meter with you to all your doctor's appointments.   Please follow up at the [facility] at the appointment times listed above.   It was a pleasure caring for you, take care!  Diabetic Ketoacidosis Diabetic ketoacidosis is a life-threatening complication of diabetes. If it is not treated, it can cause severe dehydration and organ damage and can lead to a coma or death. CAUSES This condition develops when there is not enough of the hormone insulin in the body. Insulin helps the body to break down sugar for energy. Without insulin, the body cannot break down sugar, so it breaks down fats instead. This leads to the production of acids that are called ketones. Ketones are poisonous at high levels. This condition can be triggered by:  Stress on the body that is brought on by an illness.  Medicines that raise blood glucose levels.  Not taking diabetes medicine. SYMPTOMS Symptoms of this condition include:  Fatigue.  Weight loss.  Excessive thirst.  Light-headedness.  Fruity or sweet-smelling breath.  Excessive urination.  Vision changes.  Confusion or irritability.  Nausea.  Vomiting.  Rapid breathing.  Abdominal pain.  Feeling flushed. DIAGNOSIS This condition is diagnosed based on a medical history, a physical exam, and blood tests. You may also have a urine test that checks for ketones. TREATMENT This condition may be treated with:  Fluid replacement. This may be  done to correct dehydration.  Insulin injections. These may be given through the skin or through an IV tube.  Electrolyte replacement. Electrolytes, such as potassium and sodium, may be given in pill form or through an IV tube.  Antibiotic medicines. These may be prescribed if your condition was caused by an infection. HOME CARE INSTRUCTIONS Eating and Drinking  Drink enough fluids to keep your urine clear or pale yellow.  If you cannot eat, alternate between drinking fluids with sugar (such as juice) and salty fluids (such as broth or bouillon).  If you can eat, follow your usual diet and drink sugar-free liquids, such as water. Other Instructions  Take insulin as directed by your health care provider. Do not skip insulin injections. Do not use expired insulin.  If your blood sugar is over 240 mg/dL, monitor your urine ketones every 4-6 hours.  If you were prescribed an antibiotic medicine, finish all of it even if you start to feel better.  Rest and exercise only as directed by your health care provider.  If you get sick, call your health care provider and begin treatment quickly. Your body often needs extra insulin to fight an illness.  Check your blood glucose levels regularly. If your blood glucose is high, drink plenty of fluids. This helps to flush out ketones. SEEK MEDICAL CARE IF:  Your blood glucose level is too high or too low.  You have ketones in your urine.  You have a fever.  You cannot eat.  You cannot tolerate fluids.  You have been vomiting for more than 2 hours.  You continue to have symptoms of this condition.  You develop new symptoms. SEEK IMMEDIATE MEDICAL CARE IF:  Your blood glucose levels continue to be high (elevated).  Your monitor reads "high" even when you are taking insulin.  You faint.  You have chest pain.  You have trouble breathing.  You have a sudden, severe headache.  You have sudden weakness in one arm or one leg.  You  have sudden trouble speaking or swallowing.  You have vomiting or diarrhea that gets worse after 3 hours.  You feel severely fatigued.  You have trouble thinking.  You have abdominal pain.  You are severely dehydrated. Symptoms of severe dehydration include:  Extreme thirst.  Dry mouth.  Blue lips.  Cold hands and feet.  Rapid breathing.   This information is not intended to replace advice given to you by your health care provider. Make sure you discuss any questions you have with your health care provider.   Document Released: 07/17/2000 Document Revised: 12/04/2014 Document Reviewed: 06/27/2014 Elsevier Interactive Patient Education [employer].

## 2015-09-12 ENCOUNTER — Ambulatory Visit (INDEPENDENT_AMBULATORY_CARE_PROVIDER_SITE_OTHER): Payer: BLUE CROSS/BLUE SHIELD | Admitting: Pharmacist

## 2015-09-12 ENCOUNTER — Encounter: Payer: Self-pay | Admitting: Pharmacist

## 2015-09-12 VITALS — BP 146/110 | HR 121 | Ht 69.5 in | Wt 221.6 lb

## 2015-09-12 DIAGNOSIS — E1310 Other specified diabetes mellitus with ketoacidosis without coma: Secondary | ICD-10-CM | POA: Diagnosis not present

## 2015-09-12 DIAGNOSIS — I10 Essential (primary) hypertension: Secondary | ICD-10-CM | POA: Diagnosis not present

## 2015-09-12 DIAGNOSIS — E669 Obesity, unspecified: Secondary | ICD-10-CM

## 2015-09-12 DIAGNOSIS — E1165 Type 2 diabetes mellitus with hyperglycemia: Secondary | ICD-10-CM | POA: Diagnosis not present

## 2015-09-12 DIAGNOSIS — E1110 Type 2 diabetes mellitus with ketoacidosis without coma: Secondary | ICD-10-CM

## 2015-09-12 DIAGNOSIS — E1169 Type 2 diabetes mellitus with other specified complication: Secondary | ICD-10-CM | POA: Insufficient documentation

## 2015-09-12 DIAGNOSIS — E119 Type 2 diabetes mellitus without complications: Secondary | ICD-10-CM | POA: Insufficient documentation

## 2015-09-12 HISTORY — DX: Type 2 diabetes mellitus with other specified complication: E11.69

## 2015-09-12 MED ORDER — INSULIN DETEMIR 100 UNIT/ML FLEXPEN: 25.0000 [IU] | PEN_INJECTOR | Freq: Two times a day (BID) | SUBCUTANEOUS | Status: DC

## 2015-09-12 NOTE — Progress Notes (Signed)
Patient ID: Nicholas Knight, male   DOB: 1971-02-05, 45 y.o.   MRN: [id number] Reviewed: Agree with Dr. Graylin Shiver documentation and management.

## 2015-09-12 NOTE — Progress Notes (Addendum)
Addendum 09/24/2015:  Spoke with pt via phone after previous discussion to increase levemir until FBG of approx. 130. Reports BG has been steady in the 130s with lowest value of 104 on levemir 28 units BID. Attempted to increase metformin to 1500mg  daily but was unable to tolerate. No reported hypoglycemia and reiterated appropriate management. Will continue levemir 28 units BID and decrease metformin to 500mg  BID with meals. Pt states he will schedule follow up with PCP Smitty Cords, MD).  __________________________________________________________________  S:    Patient arrives in good spirits.  Presents for diabetes evaluation, education, and management following recent hospital discharge for DKA. Patient was referred on 09/11/2015  Patient was last seen by Primary Care Provider on 07/08/2015.   Diabetes was diagnosed earlier this week prior to visit.   Patient denies adherence with medications ( Hasn't picked up from pharmacy yet from discharge last night) Current diabetes medications include: Metformin 500mg  BID, Levemir 45 units QHS Current hypertension medications include: Lisinopril 2.5mg  daily  Patient denies hypoglycemic events.  Patient reported dietary habits: Eats 3 meals/day Breakfast: bacon/egg sandwich, pastries Lunch/Dinner: Generally fast food for both meals; reports dinner is largest meal Drinks: Drinks Gatorade often  Patient reported exercise habits: Generally exercises at work however, tries to walk a couple of times per week   Patient reports nocturia. Improved from every hour to once or twice a night. Patient denies neuropathy. Patient reports visual changes. Still has some blurriness but improving Patient denies self foot exams.    O:  Lab Results  Component Value Date   HGBA1C 12.9* 09/08/2015   There were no vitals filed for this visit.  No BG available other than from inpatient. Hasn't picked up meter from pharmacy as of yet. BG in hospital mid  200s-high 300s  A/P: Diabetes recently diagnosed, discharged yesterday following admission for DKA - currently uncontrolled. Patient denies hypoglycemic events and is able to verbalize appropriate hypoglycemia management plan. Patient denies adherence with medication since he hasn't received them yet. Control is suboptimal due to poor dietary habits. Discussed lifestyle changes extensively with patient, especially diet. Advised to limit fast food and sugary drink intake while at work as much as possible. Recommended bring lunch to work vs. [employer]. At the very least, order healthier options if eating fast food. Discussed proper amount of carbs at each each meal as well as the plate method as a visual aid when eating out. Instructed patient to start taking levemir 25 units BID as well as to take metformin 500mg  BID with meals to avoid GI upset. Also instructed to check FBG every morning and to get at least two days worth of pre- and post- dinner BG. Will follow up with patient by phone on Monday 09/16/2015 to assess progress.  Patient has been prescribed lisinopril in the past for hypertension. BP controlled in hospital however, was elevated near discharge. Lisinopril 2.5mg  daily ordered on discharge. BP today 146/110 with HR 121. Hasn't received medication from pharmacy as of yet but states being a little nervous during visit today. Will monitor.   Written patient instructions provided.  Total time in face to face counseling 35 minutes.    Follow up in Pharmacist Clinic Visit in approximately 2 weeks pending progress on Monday. Patient seen with Stephens November, PharmD Resident.

## 2015-09-12 NOTE — Patient Instructions (Signed)
Thanks for coming today!  Start taking levemir 25 units twice daily (in the morning and at bedtime). Pick up all medications other than levemir for now.  Check blood sugar in the morning before breakfast everyday. Check pre- and post- dinner blood sugar x2 days.  Try to stay on top of dietary choices. Limit fast food during work or bring lunch and replace sugary drinks with water as much as possible.   Increase activity as work schedule permits  Will follow up with you on Monday by phone. Call the clinic if you have any other questions.

## 2015-09-12 NOTE — Assessment & Plan Note (Signed)
Patient has been prescribed lisinopril in the past for hypertension. BP controlled in hospital however, was elevated near discharge. Lisinopril 2.5mg  daily ordered on discharge. BP today 146/110 with HR 121. Hasn't received medication from pharmacy as of yet but states being a little nervous during visit today. Will monitor.

## 2015-09-12 NOTE — Assessment & Plan Note (Signed)
Diabetes recently diagnosed, discharged yesterday following admission for DKA - currently uncontrolled. Patient denies hypoglycemic events and is able to verbalize appropriate hypoglycemia management plan. Patient denies adherence with medication since he hasn't received them yet. Control is suboptimal due to poor dietary habits. Discussed lifestyle changes extensively with patient, especially diet. Advised to limit fast food and sugary drink intake while at work as much as possible. Recommended bring lunch to work vs. [employer]. At the very least, order healthier options if eating fast food. Discussed proper amount of carbs at each each meal as well as the plate method as a visual aid when eating out. Instructed patient to start taking levemir 25 units BID as well as to take metformin 500mg  BID with meals to avoid GI upset. Also instructed to check FBG every morning and to get at least two days worth of pre- and post- dinner BG. Will follow up with patient by phone on Monday 09/16/2015 to assess progress.

## 2015-09-13 ENCOUNTER — Ambulatory Visit (INDEPENDENT_AMBULATORY_CARE_PROVIDER_SITE_OTHER): Payer: BLUE CROSS/BLUE SHIELD | Admitting: Student

## 2015-09-13 ENCOUNTER — Encounter: Payer: Self-pay | Admitting: Student

## 2015-09-13 VITALS — BP 145/77 | HR 128 | Temp 98.3°F | Ht 67.0 in | Wt 221.9 lb

## 2015-09-13 DIAGNOSIS — E119 Type 2 diabetes mellitus without complications: Secondary | ICD-10-CM | POA: Diagnosis not present

## 2015-09-13 DIAGNOSIS — H538 Other visual disturbances: Secondary | ICD-10-CM | POA: Diagnosis not present

## 2015-09-13 DIAGNOSIS — E1165 Type 2 diabetes mellitus with hyperglycemia: Secondary | ICD-10-CM

## 2015-09-13 DIAGNOSIS — I10 Essential (primary) hypertension: Secondary | ICD-10-CM

## 2015-09-13 NOTE — Assessment & Plan Note (Signed)
Fasting blood sugar slightly elevated. His insulin dose was changed yesterday. He is also on metformin.  -Advised to return in two weeks for POCT glucose and Bmet. -Can increase his metformin and/or insulin if blood glucose is high at that time. -Discussed about  hypoglycemia and gave handout -Advised to see eye doctor for his blurred vision. He has [employer]. He can look up ophthalmologists in the area.

## 2015-09-13 NOTE — Progress Notes (Signed)
   Subjective:    Patient ID: Nicholas Knight, male    DOB: 1970/09/03, 45 y.o.   MRN: [id number]  HPI Diabetes: FBG 216 this morning. He checked his blood sugar about 1 pm and it was 204. He didn't eat his lunch. He is using 25 units of Levemir twice a day. He also takes his metformin. Denies problem using his insulin. Denies side effects with metformin. Denies signs of hypoglycemia. He reports blurry vision. Denies neuropathic pain. Denies polydypsia, polyuria and polyphagia. Received his PPSV-23 and flu shot while in the hospital.  Hypertension: BP 145/21mmHg. He was discharged from hospital on lisinopril 2.5 mg daily. He says he lost his lisinopril prescription yesterday. Didn't take this morning.   Blurred vision: this has been going on for sometimes now. Hasn't been to an ophthalmologist.   ROS  Per HPI Objective:   Physical Exam Filed Vitals:   09/13/15 1455  BP: 145/77  Pulse: 128  Temp: 98.3 F (36.8 C)  TempSrc: Oral  Height: 5\' 7"  (1.702 m)  Weight: 221 lb 14.4 oz (100.653 kg)  SpO2: 100%   Gen: appears well CV: regular rate and rythm. S1 & S2 audible Resp: no apparent work of breathing GI: bowel sounds normal, no tenderness to palpation Skin: no lesion    Assessment & Plan:  Diabetes (HCC) Fasting blood sugar slightly elevated. His insulin dose was changed yesterday. He is also on metformin.  -Advised to return in two weeks for POCT glucose and Bmet. -Can increase his metformin and/or insulin if blood glucose is high at that time. -Discussed about  hypoglycemia and gave handout -Advised to see eye doctor for his blurred vision. He has Multimedia programmer. He can look up ophthalmologists in the area.  Hypertension His blood pressure is 145/77 mmHg. He hasn't taken his lisinopril this morning because he lost it. He has one refill left on this. -Advised him to go to the pharmacy and get it filled.  -Consider increasing his lisinopril for kidney protection in the  future -Bmet in two weeks (future order placed)  Blurred vision This could be secondary to his diabetes. -Advised to call an ophthalmologist in the area to schedule an appointment. He has Multimedia programmer.

## 2015-09-13 NOTE — Assessment & Plan Note (Signed)
This could be secondary to his diabetes. -Advised to call an ophthalmologist in the area to schedule an appointment. He has Multimedia programmer.

## 2015-09-13 NOTE — Patient Instructions (Addendum)
It was great seeing you today! We have addressed the following issues today   Diabetes: continue using your insulin and metformin as they are.   Blood pressure: you have on refill left on your lisinopril. Go to pharmacy and tell them that you lost the prescription. They should be able to fill for you.  Blurred vision: look up eye doctors in [place] area and give them a call to schedule an appointment.    If we did any lab work today, and the results require attention, either me or my nurse will get in touch with you. If everything is normal, you will get a letter in mail. If you don't hear from [place] in two weeks, please give [place] a call. Otherwise, I look forward to talking with you again at our next visit. If you have any questions or concerns before then, please call the clinic at [phone].  Please bring all your medications to every doctors visit   Sign up for My Chart to have easy access to your labs results, and communication with your Primary care physician.    Please check-out at the front desk before leaving the clinic.   Take Care,    Hypoglycemia Hypoglycemia occurs when the glucose in your blood is too low. Glucose is a type of sugar that is your body's main energy source. Hormones, such as insulin and glucagon, control the level of glucose in the blood. Insulin lowers blood glucose and glucagon increases blood glucose. Having too much insulin in your blood stream, or not eating enough food containing sugar, can result in hypoglycemia. Hypoglycemia can happen to people with or without diabetes. It can develop quickly and can be a medical emergency.  CAUSES   Missing or delaying meals.  Not eating enough carbohydrates at meals.  Taking too much diabetes medicine.  Not timing your oral diabetes medicine or insulin doses with meals, snacks, and exercise.  Nausea and vomiting.  Certain medicines.  Severe illnesses, such as hepatitis, kidney disorders, and certain  eating disorders.  Increased activity or exercise without eating something extra or adjusting medicines.  Drinking too much alcohol.  A nerve disorder that affects body functions like your heart rate, blood pressure, and digestion (autonomic neuropathy).  A condition where the stomach muscles do not function properly (gastroparesis). Therefore, medicines and food may not absorb properly.  Rarely, a tumor of the pancreas can produce too much insulin. SYMPTOMS   Hunger.  Sweating (diaphoresis).  Change in body temperature.  Shakiness.  Headache.  Anxiety.  Lightheadedness.  Irritability.  Difficulty concentrating.  Dry mouth.  Tingling or numbness in the hands or feet.  Restless sleep or sleep disturbances.  Altered speech and coordination.  Change in mental status.  Seizures or prolonged convulsions.  Combativeness.  Drowsiness (lethargic).  Weakness.  Increased heart rate or palpitations.  Confusion.  Pale, gray skin color.  Blurred or double vision.  Fainting. DIAGNOSIS  A physical exam and medical history will be performed. Your caregiver may make a diagnosis based on your symptoms. Blood tests and other lab tests may be performed to confirm a diagnosis. Once the diagnosis is made, your caregiver will see if your signs and symptoms go away once your blood glucose is raised.  TREATMENT  Usually, you can easily treat your hypoglycemia when you notice symptoms.  Check your blood glucose. If it is less than 70 mg/dl, take one of the following:   3-4 glucose tablets.    cup juice.  cup regular soda.   1 cup skim milk.   -1 tube of glucose gel.   5-6 hard candies.   Avoid high-fat drinks or food that may delay a rise in blood glucose levels.  Do not take more than the recommended amount of sugary foods, drinks, gel, or tablets. Doing so will cause your blood glucose to go too high.   Wait 10-15 minutes and recheck your blood  glucose. If it is still less than 70 mg/dl or below your target range, repeat treatment.   Eat a snack if it is more than 1 hour until your next meal.  There may be a time when your blood glucose may go so low that you are unable to treat yourself at home when you start to notice symptoms. You may need someone to help you. You may even faint or be unable to swallow. If you cannot treat yourself, someone will need to bring you to the hospital.  [place]  If you have diabetes, follow your diabetes management plan by:  Taking your medicines as directed.  Following your exercise plan.  Following your meal plan. Do not skip meals. Eat on time.  Testing your blood glucose regularly. Check your blood glucose before and after exercise. If you exercise longer or different than usual, be sure to check blood glucose more frequently.  Wearing your medical alert jewelry that says you have diabetes.  Identify the cause of your hypoglycemia. Then, develop ways to prevent the recurrence of hypoglycemia.  Do not take a hot bath or shower right after an insulin shot.  Always carry treatment with you. Glucose tablets are the easiest to carry.  If you are going to drink alcohol, drink it only with meals.  Tell friends or family members ways to keep you safe during a seizure. This may include removing hard or sharp objects from the area or turning you on your side.  Maintain a healthy weight. SEEK MEDICAL CARE IF:   You are having problems keeping your blood glucose in your target range.  You are having frequent episodes of hypoglycemia.  You feel you might be having side effects from your medicines.  You are not sure why your blood glucose is dropping so low.  You notice a change in vision or a new problem with your vision. SEEK IMMEDIATE MEDICAL CARE IF:   Confusion develops.  A change in mental status occurs.  The inability to swallow develops.  Fainting occurs.   This  information is not intended to replace advice given to you by your health care provider. Make sure you discuss any questions you have with your health care provider.   Document Released: 07/20/2005 Document Revised: 07/25/2013 Document Reviewed: 03/26/2015 Elsevier Interactive Patient Education 2016 [employer].     Exercise Guide:

## 2015-09-13 NOTE — Assessment & Plan Note (Signed)
His blood pressure is 145/77 mmHg. He hasn't taken his lisinopril this morning because he lost it. He has one refill left on this. -Advised him to go to the pharmacy and get it filled.  -Consider increasing his lisinopril for kidney protection in the future -Bmet in two weeks (future order placed)

## 2015-09-17 ENCOUNTER — Telehealth: Payer: Self-pay | Admitting: Pharmacist

## 2015-09-17 DIAGNOSIS — E1110 Type 2 diabetes mellitus with ketoacidosis without coma: Secondary | ICD-10-CM

## 2015-09-17 DIAGNOSIS — E1165 Type 2 diabetes mellitus with hyperglycemia: Secondary | ICD-10-CM

## 2015-09-17 NOTE — Telephone Encounter (Signed)
F/U phone call from visit in Rx clinic on 09/12/2015 regarding diabetes management. Pt reports visual blurriness has resolved and that he is tolerating his insulin and metformin. No reported hypoglycemia. Adjusted diabetes medications based on reported BG monitoring:  02/11: FBG 252, pre-dinner 285, post-dinner 347 02/12: FBG 225, pre-dinner 276, post-dinner 300  Increased levemir to 27 units BID starting 2/15. Pt. will increase by one unit BID until FBG of approx.130. Also increased metfomin to 1500mg  . Will follow up again by phone on 09/20/2015. Pt. given Rx clinic number if questions.

## 2015-10-01 ENCOUNTER — Other Ambulatory Visit (INDEPENDENT_AMBULATORY_CARE_PROVIDER_SITE_OTHER): Payer: BLUE CROSS/BLUE SHIELD

## 2015-10-01 DIAGNOSIS — E119 Type 2 diabetes mellitus without complications: Secondary | ICD-10-CM | POA: Diagnosis not present

## 2015-10-01 DIAGNOSIS — I10 Essential (primary) hypertension: Secondary | ICD-10-CM

## 2015-10-01 LAB — GLUCOSE, CAPILLARY: GLUCOSE-CAPILLARY: 79 mg/dL (ref 65–99)

## 2015-10-01 NOTE — Progress Notes (Signed)
Bmp and cbg done today [facility] Filip Luten

## 2015-10-02 LAB — BASIC METABOLIC PANEL
BUN: 15 mg/dL (ref 7–25)
CHLORIDE: 103 mmol/L (ref 98–110)
CO2: 25 mmol/L (ref 20–31)
Calcium: 9.8 mg/dL (ref 8.6–10.3)
Creat: 0.95 mg/dL (ref 0.60–1.35)
GLUCOSE: 81 mg/dL (ref 65–99)
POTASSIUM: 4.1 mmol/L (ref 3.5–5.3)
SODIUM: 140 mmol/L (ref 135–146)

## 2015-10-04 ENCOUNTER — Encounter: Payer: Self-pay | Admitting: Student

## 2015-10-14 ENCOUNTER — Ambulatory Visit: Payer: BLUE CROSS/BLUE SHIELD | Admitting: Family Medicine

## 2015-10-18 ENCOUNTER — Telehealth: Payer: Self-pay | Admitting: Pharmacist

## 2015-10-18 DIAGNOSIS — E1110 Type 2 diabetes mellitus with ketoacidosis without coma: Secondary | ICD-10-CM

## 2015-10-18 DIAGNOSIS — E1165 Type 2 diabetes mellitus with hyperglycemia: Secondary | ICD-10-CM

## 2015-10-18 MED ORDER — INSULIN DETEMIR 100 UNIT/ML FLEXPEN: 28.0000 [IU] | PEN_INJECTOR | Freq: Two times a day (BID) | SUBCUTANEOUS | Status: DC

## 2015-10-18 MED ORDER — METFORMIN HCL 500 MG PO TABS: 500.0000 mg | ORAL_TABLET | Freq: Two times a day (BID) | ORAL | Status: DC

## 2015-10-18 NOTE — Telephone Encounter (Signed)
Patient called stating blood glucose readings were consistently 80-90 and his supply of metformin and Levemir are in short supply.   Advised refills will be provided to cover him until his follow up in early April.  No changes in doses.   Patient was very appreciative of the help provided by [place] [facility] Pharm[facility] resident in using/dosing his insulin.

## 2015-11-04 ENCOUNTER — Encounter: Payer: Self-pay | Admitting: Family Medicine

## 2015-11-04 ENCOUNTER — Ambulatory Visit (INDEPENDENT_AMBULATORY_CARE_PROVIDER_SITE_OTHER): Payer: BLUE CROSS/BLUE SHIELD | Admitting: Family Medicine

## 2015-11-04 VITALS — BP 166/92 | HR 107 | Temp 99.0°F | Wt 216.8 lb

## 2015-11-04 DIAGNOSIS — E1165 Type 2 diabetes mellitus with hyperglycemia: Secondary | ICD-10-CM

## 2015-11-04 DIAGNOSIS — I10 Essential (primary) hypertension: Secondary | ICD-10-CM

## 2015-11-04 DIAGNOSIS — E1310 Other specified diabetes mellitus with ketoacidosis without coma: Secondary | ICD-10-CM | POA: Diagnosis not present

## 2015-11-04 DIAGNOSIS — H538 Other visual disturbances: Secondary | ICD-10-CM | POA: Diagnosis not present

## 2015-11-04 DIAGNOSIS — E1110 Type 2 diabetes mellitus with ketoacidosis without coma: Secondary | ICD-10-CM

## 2015-11-04 MED ORDER — LISINOPRIL 5 MG PO TABS: 5.0000 mg | ORAL_TABLET | Freq: Every day | ORAL | Status: DC

## 2015-11-04 MED ORDER — METFORMIN HCL 500 MG PO TABS: 500.0000 mg | ORAL_TABLET | Freq: Two times a day (BID) | ORAL | Status: DC

## 2015-11-04 MED ORDER — INSULIN DETEMIR 100 UNIT/ML FLEXPEN: 28.0000 [IU] | PEN_INJECTOR | Freq: Two times a day (BID) | SUBCUTANEOUS | Status: DC

## 2015-11-04 MED ORDER — INSULIN PEN NEEDLE 31G X 5 MM MISC: Status: DC

## 2015-11-04 NOTE — Progress Notes (Signed)
   Subjective:    Patient ID: Nicholas Knight , male   DOB: [birth year] , 45 y.o..   MRN: [id number]  HPI  Shooter Villapando is here for follow up.  Diabetes Mellitus: Patient has been taking Levemir 28 U BID since 2/14. Initially when taking Metformin had diarrhea, it has since resolved. Checks glucose 3-4 times/day. Fasting glucose usually in 80's in the morning. The highest it has been in the morning is 120. Denies hypoglycemic events, polyuria, polydipsia, numbness/tingling of extremities.   Blurry vision: Has been the same since last visit. Patient went to opthamologist 3 weeks ago (Dr. Delman Cheadle). Stated no need for glassess and his vision would com back 100%. Apparently he still has 20/20 vision bilaterally.   Hypertension:  Blood pressure is not well controlled at home. He is exercising and is adherent to a low-salt diet. He currently takes Lisinopril and is adherent to regimen.  Cardiac symptoms: none. Patient denies: chest pain, claudication, exertional chest pressure/discomfort, lower extremity edema and palpitations.   Review of Systems: Per HPI. All other systems reviewed and are negative.  Past Medical History: Patient Active Problem List   Diagnosis Date Noted  . Blurred vision 09/13/2015  . Diabetes ([place]) 09/12/2015  . Hypertension 07/07/2012   Medications: reviewed and updated  Social Hx:   reports that he has never smoked. He does not have any smokeless tobacco history on file.    Objective:   BP 170/96 mmHg  Pulse 107  Temp(Src) 99 F (37.2 C) (Oral)  Wt 98.34 kg (216 lb 12.8 oz) Physical Exam  Gen: NAD, alert, cooperative with exam, well-appearing HEENT: NCAT, PERRL, clear conjunctiva, oropharynx clear, supple neck CV: RRR, good S1/S2, no murmur, no edema, capillary refill brisk  Resp: CTABL, no wheezes, non-labored Skin: no rashes, normal turgor  Psych: good insight, alert and oriented  Diabetic Foot Exam - Simple   Simple Foot Form  Diabetic Foot exam was  performed with the following findings:  Yes 11/04/2015 12:13 PM  Visual Inspection  No deformities, no ulcerations, no other skin breakdown bilaterally:  Yes  Sensation Testing  Intact to touch and monofilament testing bilaterally:  Yes  Pulse Check  Posterior Tibialis and Dorsalis pulse intact bilaterally:  Yes  Comments  Bilateral great toe onychomycosis         Assessment & Plan:  No problem-specific assessment & plan notes found for this encounter.

## 2015-11-04 NOTE — Patient Instructions (Signed)
Thank you for coming in today, it was so nice to see you!  Today we discussed your blood pressure and your diabetes. You are doing great with your diabetes medications, keep it up! Your blood pressure is still high. We will need to increase your Lisinopril, I will send this new higher dose to your pharmacy. I will also refill all your other medications.   Please schedule an appointment to see me in 1 month so we can check your A1C and make sure the rest of your blood work looks ok.   If you have any questions or concerns, please do not hesitate to call the office at [phone].  Sincerely,  Smitty Cords, MD

## 2015-11-04 NOTE — Assessment & Plan Note (Addendum)
Uncontrolled. BP today 170/96 on arrival and then 166/92 when re-checked. Has not had BP this high before (typically systolic in low [id number]), unlikely his normal baseline. Patient states he is compliant with medication. BMP WNL last month.  - Increase Lisinopril to 5 mg daily, sent new dose to pharmacy.  - Follow up in 1 month to re-check BP and make adjustments to antihypertensive therapy if needed

## 2015-11-04 NOTE — Assessment & Plan Note (Signed)
Likely secondary to uncontrolled diabetes. Followed up with opthalmology 3 weeks ago. Will look for records of visit.

## 2015-11-04 NOTE — Assessment & Plan Note (Addendum)
Controlled CBGs with uncontrolled A1C. BMP last month WNL.  - Repeat Hgb A1C in 1 month and will follow up then - Continue Levemir 28 U BID - Continue Metformin 500 mg BID - Refilled diabetic medications today

## 2015-11-11 ENCOUNTER — Other Ambulatory Visit: Payer: Self-pay | Admitting: Family Medicine

## 2015-11-18 ENCOUNTER — Telehealth: Payer: Self-pay | Admitting: Pharmacist

## 2015-11-18 DIAGNOSIS — E1165 Type 2 diabetes mellitus with hyperglycemia: Secondary | ICD-10-CM

## 2015-11-18 DIAGNOSIS — E1110 Type 2 diabetes mellitus with ketoacidosis without coma: Secondary | ICD-10-CM

## 2015-11-18 MED ORDER — INSULIN DETEMIR 100 UNIT/ML FLEXPEN
28.0000 [IU] | PEN_INJECTOR | Freq: Two times a day (BID) | SUBCUTANEOUS | Status: DC
Start: 2015-11-18 — End: 2016-03-05

## 2015-11-18 NOTE — Telephone Encounter (Signed)
Requested refill of Levemir. Called back and left message that we would refill his previous dose. Refilled 1 month supply of Levemir

## 2015-12-06 ENCOUNTER — Other Ambulatory Visit: Payer: BLUE CROSS/BLUE SHIELD

## 2016-03-05 ENCOUNTER — Other Ambulatory Visit: Payer: Self-pay | Admitting: Family Medicine

## 2016-03-05 DIAGNOSIS — E1165 Type 2 diabetes mellitus with hyperglycemia: Secondary | ICD-10-CM

## 2016-03-05 DIAGNOSIS — E1110 Type 2 diabetes mellitus with ketoacidosis without coma: Secondary | ICD-10-CM

## 2016-03-13 ENCOUNTER — Telehealth: Payer: Self-pay | Admitting: Family Medicine

## 2016-03-13 NOTE — Telephone Encounter (Signed)
Patient asks PCP to complete FMLA form. Please, follow up.

## 2016-03-13 NOTE — Telephone Encounter (Signed)
Form placed on PCP box for completion. Nicholas Knight, [name], [name]

## 2016-03-18 NOTE — Telephone Encounter (Signed)
FMLA placed in Nicholas Knight's box.

## 2016-03-18 NOTE — Telephone Encounter (Signed)
Patient informed that FMLA forms are complete and ready for pickup.  Forms copied for scanning in patient's record.  Derl Barrow, RN

## 2016-05-01 ENCOUNTER — Other Ambulatory Visit: Payer: Self-pay | Admitting: *Deleted

## 2016-05-01 DIAGNOSIS — E1165 Type 2 diabetes mellitus with hyperglycemia: Secondary | ICD-10-CM

## 2016-05-01 MED ORDER — METFORMIN HCL 500 MG PO TABS: 500.0000 mg | ORAL_TABLET | Freq: Two times a day (BID) | ORAL | 3 refills | Status: DC

## 2016-05-10 ENCOUNTER — Telehealth: Payer: Self-pay | Admitting: Pharmacist

## 2016-05-10 DIAGNOSIS — E1165 Type 2 diabetes mellitus with hyperglycemia: Secondary | ICD-10-CM

## 2016-05-10 DIAGNOSIS — E1110 Type 2 diabetes mellitus with ketoacidosis without coma: Secondary | ICD-10-CM

## 2016-05-10 MED ORDER — METFORMIN HCL 500 MG PO TABS: 500.0000 mg | ORAL_TABLET | Freq: Two times a day (BID) | ORAL | 1 refills | Status: DC

## 2016-05-10 MED ORDER — INSULIN DETEMIR 100 UNIT/ML FLEXPEN: 28.0000 [IU] | PEN_INJECTOR | Freq: Two times a day (BID) | SUBCUTANEOUS | 1 refills | Status: DC

## 2016-05-10 MED ORDER — LISINOPRIL 5 MG PO TABS: 5.0000 mg | ORAL_TABLET | Freq: Every day | ORAL | 1 refills | Status: DC

## 2016-05-10 NOTE — Telephone Encounter (Signed)
Patient requested phone in for refills of metformin, LIsinopril and Levemir.   Provided new Rx with 1 refill. Requested New Appointment with PCP.   Needs followup A1C to assess improvement in control.

## 2016-06-17 ENCOUNTER — Other Ambulatory Visit: Payer: Self-pay | Admitting: *Deleted

## 2016-06-17 MED ORDER — LISINOPRIL 5 MG PO TABS
5.0000 mg | ORAL_TABLET | Freq: Every day | ORAL | 1 refills | Status: DC
Start: 2016-06-17 — End: 2016-12-09

## 2016-06-17 NOTE — Telephone Encounter (Signed)
Refill request for 90 day supply.  Martin, Tamika L, RN  

## 2016-07-04 ENCOUNTER — Other Ambulatory Visit: Payer: Self-pay | Admitting: Family Medicine

## 2016-07-04 DIAGNOSIS — E1165 Type 2 diabetes mellitus with hyperglycemia: Secondary | ICD-10-CM

## 2016-07-04 DIAGNOSIS — E1110 Type 2 diabetes mellitus with ketoacidosis without coma: Secondary | ICD-10-CM

## 2016-09-12 ENCOUNTER — Other Ambulatory Visit: Payer: Self-pay | Admitting: Family Medicine

## 2016-09-12 DIAGNOSIS — E1165 Type 2 diabetes mellitus with hyperglycemia: Secondary | ICD-10-CM

## 2016-09-18 MED ORDER — METFORMIN HCL 500 MG PO TABS: ORAL_TABLET | ORAL | 0 refills | Status: DC

## 2016-09-18 NOTE — Telephone Encounter (Signed)
Patient's insurance requires a 90 day supply for metformin.  Derl Barrow, RN

## 2016-09-18 NOTE — Addendum Note (Signed)
Addended by: Carlyle Dolly on: 09/18/2016 05:10 PM   Modules accepted: Orders

## 2016-10-03 ENCOUNTER — Other Ambulatory Visit: Payer: Self-pay | Admitting: Family Medicine

## 2016-10-03 DIAGNOSIS — E1110 Type 2 diabetes mellitus with ketoacidosis without coma: Secondary | ICD-10-CM

## 2016-10-03 DIAGNOSIS — E1165 Type 2 diabetes mellitus with hyperglycemia: Secondary | ICD-10-CM

## 2016-10-17 ENCOUNTER — Other Ambulatory Visit: Payer: Self-pay | Admitting: Family Medicine

## 2016-10-17 DIAGNOSIS — E1165 Type 2 diabetes mellitus with hyperglycemia: Secondary | ICD-10-CM

## 2016-10-17 DIAGNOSIS — E1110 Type 2 diabetes mellitus with ketoacidosis without coma: Secondary | ICD-10-CM

## 2016-10-19 NOTE — Telephone Encounter (Signed)
Refill requested for insulin. We have not seen Nicholas Knight in about 1 year. He will need to be seen ASAP to get future refills for diabetes medications. [facility] team please call patient and ask him to schedule an appt to be seen to discuss diabetes. Thank you.   Smitty Cords, MD [place], PGY-2

## 2016-10-21 NOTE — Telephone Encounter (Signed)
Tried to contact pt and the phone that I called was another man's name so I did not leave a message. If pt calls back please assist him in getting an appointment scheduled for this. Katharina Caper, April D, [name]

## 2016-11-17 ENCOUNTER — Telehealth: Payer: Self-pay | Admitting: Family Medicine

## 2016-11-17 NOTE — Telephone Encounter (Signed)
I tried all of the numbers provided in the pt's chart but none of them worked. Neither of the numbers that are list under the pt are active and the emergency contacts were all busy. - Mesha Guinyard

## 2016-11-17 NOTE — Telephone Encounter (Signed)
Attempted to call again, no answer.  Will await callback from patient. Fleeger, Salome Spotted, CMA

## 2016-12-09 ENCOUNTER — Other Ambulatory Visit: Payer: Self-pay | Admitting: Family Medicine

## 2016-12-16 ENCOUNTER — Other Ambulatory Visit: Payer: Self-pay | Admitting: Family Medicine

## 2016-12-16 DIAGNOSIS — E1165 Type 2 diabetes mellitus with hyperglycemia: Secondary | ICD-10-CM

## 2016-12-17 ENCOUNTER — Encounter: Payer: Self-pay | Admitting: Family Medicine

## 2017-01-15 ENCOUNTER — Telehealth: Payer: Self-pay | Admitting: Family Medicine

## 2017-01-15 NOTE — Telephone Encounter (Signed)
Tried to call contact pt but none of the numbers worked. -Mesha Guinyard

## 2017-03-20 ENCOUNTER — Other Ambulatory Visit: Payer: Self-pay | Admitting: Family Medicine

## 2017-03-20 DIAGNOSIS — E1165 Type 2 diabetes mellitus with hyperglycemia: Secondary | ICD-10-CM

## 2017-03-22 NOTE — Telephone Encounter (Signed)
Refill requested for Metformin. Patient has not been seen since April 2017 for diabetes. Will give 1 month supply but he will need an appointment for future refills. [facility] team please call patient and inform him of this. Thank you.

## 2017-03-22 NOTE — Telephone Encounter (Signed)
Called pt. Phone rang with no option to leave a message. If pt calls, please help him make an appt to see Dr. Juanito Doom for further medication refills. (see note below)  Nicholas Knight, CMA

## 2017-04-02 ENCOUNTER — Other Ambulatory Visit: Payer: Self-pay | Admitting: Family Medicine

## 2017-04-02 DIAGNOSIS — E1165 Type 2 diabetes mellitus with hyperglycemia: Secondary | ICD-10-CM

## 2017-04-19 ENCOUNTER — Other Ambulatory Visit: Payer: Self-pay | Admitting: Family Medicine

## 2017-04-19 DIAGNOSIS — E1165 Type 2 diabetes mellitus with hyperglycemia: Secondary | ICD-10-CM

## 2017-05-29 ENCOUNTER — Other Ambulatory Visit: Payer: Self-pay | Admitting: Family Medicine

## 2017-05-29 DIAGNOSIS — E1165 Type 2 diabetes mellitus with hyperglycemia: Secondary | ICD-10-CM

## 2017-06-01 ENCOUNTER — Encounter: Payer: Self-pay | Admitting: Family Medicine

## 2017-06-01 NOTE — Telephone Encounter (Signed)
Refill requested for Metformin. Have not seen patient since April 2017 and we have tried several times to get in touch with him for follow up appt. Will not be able to refill medications until we can see him again. Will send letter to home stating this as the phone numbers we have in chart for him are not working.   Smitty Cords, MD [place], PGY-3

## 2017-06-01 NOTE — Progress Notes (Unsigned)
ov

## 2017-06-02 ENCOUNTER — Other Ambulatory Visit: Payer: Self-pay | Admitting: Family Medicine

## 2017-06-02 NOTE — Telephone Encounter (Signed)
As mentioned in previous refill request, patient needs a follow up visit to receive refills. Sent a letter to his home as none of the numbers were working.  Smitty Cords, MD [place], PGY-3

## 2017-06-05 ENCOUNTER — Other Ambulatory Visit: Payer: Self-pay | Admitting: Family Medicine

## 2017-06-05 DIAGNOSIS — E1165 Type 2 diabetes mellitus with hyperglycemia: Secondary | ICD-10-CM

## 2017-06-07 NOTE — Telephone Encounter (Signed)
Patient needs appointment for future refills.

## 2017-06-08 NOTE — Telephone Encounter (Signed)
Tried to contact pt at home number and man answered and said I had wrong number. Nicholas Knight, April D, [name]

## 2017-06-23 NOTE — Telephone Encounter (Signed)
Tried to call pt. Home number on record is not correct. I have mailed a letter to pt asking him to call our office. If pt calls, please schedule him an appt for medication refill. Ottis Stain, CMA

## 2018-02-05 ENCOUNTER — Other Ambulatory Visit: Payer: Self-pay

## 2018-02-05 ENCOUNTER — Encounter (HOSPITAL_COMMUNITY): Payer: Self-pay | Admitting: *Deleted

## 2018-02-05 ENCOUNTER — Emergency Department (HOSPITAL_COMMUNITY)
Admission: EM | Admit: 2018-02-05 | Discharge: 2018-02-05 | Disposition: A | Discharge status: Home / Self Care | Payer: BLUE CROSS/BLUE SHIELD | Attending: Emergency Medicine | Admitting: Emergency Medicine

## 2018-02-05 DIAGNOSIS — M5441 Lumbago with sciatica, right side: Secondary | ICD-10-CM | POA: Diagnosis not present

## 2018-02-05 DIAGNOSIS — Z79899 Other long term (current) drug therapy: Secondary | ICD-10-CM | POA: Diagnosis not present

## 2018-02-05 DIAGNOSIS — Z794 Long term (current) use of insulin: Secondary | ICD-10-CM | POA: Diagnosis not present

## 2018-02-05 DIAGNOSIS — E119 Type 2 diabetes mellitus without complications: Secondary | ICD-10-CM | POA: Insufficient documentation

## 2018-02-05 DIAGNOSIS — M545 Low back pain: Secondary | ICD-10-CM | POA: Diagnosis present

## 2018-02-05 DIAGNOSIS — I10 Essential (primary) hypertension: Secondary | ICD-10-CM | POA: Diagnosis not present

## 2018-02-05 LAB — URINALYSIS, COMPLETE (UACMP) WITH MICROSCOPIC
Bacteria, UA: NONE SEEN
Bilirubin Urine: NEGATIVE
GLUCOSE, UA: NEGATIVE mg/dL
HGB URINE DIPSTICK: NEGATIVE
KETONES UR: 20 mg/dL — AB
NITRITE: NEGATIVE
PROTEIN: 30 mg/dL — AB
Specific Gravity, Urine: 1.038 — ABNORMAL HIGH (ref 1.005–1.030)
pH: 5 (ref 5.0–8.0)

## 2018-02-05 MED ORDER — KETOROLAC TROMETHAMINE 30 MG/ML IJ SOLN
30.0000 mg | Freq: Once | INTRAMUSCULAR | Status: AC
  Administered 2018-02-05: 30 mg via INTRAMUSCULAR
  Filled 2018-02-05: qty 1

## 2018-02-05 MED ORDER — IBUPROFEN 800 MG PO TABS: 800.0000 mg | ORAL_TABLET | Freq: Three times a day (TID) | ORAL | 0 refills | Status: DC

## 2018-02-05 MED ORDER — CYCLOBENZAPRINE HCL 10 MG PO TABS
5.0000 mg | ORAL_TABLET | Freq: Once | ORAL | Status: AC
  Administered 2018-02-05: 5 mg via ORAL
  Filled 2018-02-05: qty 1

## 2018-02-05 MED ORDER — CYCLOBENZAPRINE HCL 5 MG PO TABS: 5.0000 mg | ORAL_TABLET | Freq: Three times a day (TID) | ORAL | 0 refills | Status: AC

## 2018-02-05 NOTE — ED Triage Notes (Signed)
Pt bib PTAR and presents with back pain. PTAR states pt is able to ambulated. Pt was dx with a kidney infection last Thursday.  Pt has been on antiobiotics x 3 days.  Pt doesn't believe that his symptoms have gotten better.  Pt hx of HTN and is noncompliant with meds.

## 2018-02-05 NOTE — ED Provider Notes (Signed)
[facility] DEPT Provider Note  CSN: [id number] Arrival date & time: 02/05/18  1934  History   Chief Complaint Chief Complaint  Patient presents with  . Back Pain    radiating into RLE    HPI Nicholas Knight is a 47 y.o. male with a medical history of HTN and Type 2 DM who presented to the ED for low back pain x 2 days. He describes left sided tight and throbbing pain that starts in his low back and radiates down his right leg. Pain is worse with walking. Denies recent falls, trauma or accidents. Denies paresthesias, weakness, foot drop or gait/coordination/balance issues. He has not tried anything to relieve the pain prior to coming to the ED.  Patient currently being treated for kidney infection that he was diagnosed with in [facility] earlier this week while on vacation.   Past Medical History:  Diagnosis Date  . Hypertension     Patient Active Problem List   Diagnosis Date Noted  . Blurred vision 09/13/2015  . Diabetes ([place]) 09/12/2015  . Hypertension 07/07/2012    History reviewed. No pertinent surgical history.      Home Medications    Prior to Admission medications   Medication Sig Start Date End Date Taking? Authorizing Provider  blood glucose meter kit and supplies Dispense based on patient and insurance preference. Use up to four times daily as directed. (FOR ICD-9 250.00, 250.01). 09/11/15   Carlyle Dolly, MD  cyclobenzaprine (FLEXERIL) 5 MG tablet Take 1 tablet (5 mg total) by mouth 3 (three) times daily for 7 days. 02/05/18 02/12/18  Ayyan Sites, Alvie Heidelberg I, PA-C  ibuprofen (ADVIL,MOTRIN) 800 MG tablet Take 1 tablet (800 mg total) by mouth 3 (three) times daily. 02/05/18   Sabeen Piechocki, Alvie Heidelberg I, PA-C  Insulin Pen Needle 31G X 5 MM MISC BD Pen Needles- brand specific Inject insulin via insulin pen 6 x daily 11/04/15   Carlyle Dolly, MD  LEVEMIR FLEXTOUCH 100 UNIT/ML Pen INJECT 28 UNITS INTO THE SKIN 2 TIMES DAILY 10/19/16   Carlyle Dolly, MD  lisinopril (PRINIVIL,ZESTRIL) 5 MG tablet TAKE 1 TABLET BY MOUTH EVERY DAY 12/10/16   Carlyle Dolly, MD  metFORMIN (GLUCOPHAGE) 500 MG tablet TAKE 1 TABLET BY MOUTH TWICE A DAY WITH A MEAL 04/19/17   Carlyle Dolly, MD    Family History Family History  Problem Relation Age of Onset  . Cancer Mother   . Lupus Maternal Grandmother     Social History Social History   Tobacco Use  . Smoking status: Never Smoker  Substance Use Topics  . Alcohol use: No  . Drug use: No     Allergies   Penicillins   Review of Systems Review of Systems  Constitutional: Negative.   Genitourinary: Negative.   Musculoskeletal: Positive for back pain. Negative for arthralgias, gait problem, joint swelling and neck pain.  Skin: Negative.   Neurological: Negative for dizziness, weakness, light-headedness, numbness and headaches.     Physical Exam Updated Vital Signs BP (!) 144/103 (BP Location: Right Arm)   Pulse 100   Temp 98.9 F (37.2 C) (Oral)   Resp 18   Ht '5\' 10"'  (1.778 m)   Wt 99.1 kg (218 lb 8 oz)   SpO2 100%   BMI 31.35 kg/m   Physical Exam  Constitutional: He appears well-developed and well-nourished.  Musculoskeletal:       Right hip: Normal.       Left hip: Normal.  Right knee: Normal.       Left knee: Normal.       Cervical back: Normal.       Thoracic back: Normal.       Lumbar back: He exhibits decreased range of motion. He exhibits no tenderness and no bony tenderness.  Positive straight leg test on right side. Limited back flexion due to pain and tightness felt in lower back and hamstrings. Tenderness of right side paraspinal muscles. No spinous process tenderness over the spine. 5/5 strength in upper and lower extremities bilaterally.  Neurological: He has normal strength and normal reflexes. No sensory deficit. He exhibits normal muscle tone. Coordination and gait normal.  Skin: Skin is intact. No rash noted.  Nursing note and vitals  reviewed.    ED Treatments / Results  Labs (all labs ordered are listed, but only abnormal results are displayed) Labs Reviewed  URINALYSIS, COMPLETE (UACMP) WITH MICROSCOPIC - Abnormal; Notable for the following components:      Result Value   Specific Gravity, Urine 1.038 (*)    Ketones, ur 20 (*)    Protein, ur 30 (*)    Leukocytes, UA SMALL (*)    All other components within normal limits  URINE CULTURE    EKG None  Radiology No results found.  Procedures Procedures (including critical care time)  Medications Ordered in ED Medications  ketorolac (TORADOL) 30 MG/ML injection 30 mg (30 mg Intramuscular Given 02/05/18 2154)  cyclobenzaprine (FLEXERIL) tablet 5 mg (5 mg Oral Given 02/05/18 2154)     Initial Impression / Assessment and Plan / ED Course  Triage vital signs and the nursing notes have been reviewed.  Pertinent labs & imaging results that were available during care of the patient were reviewed and considered in medical decision making (see chart for details). Clinical Course as of Feb 05 2158  Sat Feb 05, 2018  2150 BP elevated at 144/103. Patient has history of HTN and reports being non-compliant with medications. No s/s consistent with end organ damage that requires additional evaluation today. Advised to take his antihypertensives as prescribed and follow-up with his PCP.   [GM]    Clinical Course User Index [GM] Alaja Goldinger, Jonelle Sports, PA-C   Patient is in no distress and well appearing. He is able to ambulate without issue or assistance. Patient has full sensation in his upper and lower extremities bilaterally. Patient's back forward flexion is limited due to pain, but the remaining movements are intact. Given acute onset of back pain without associated trauma or injury, imaging is not indicated today. There are no other physical exam findings or s/s that suggest an infectious or rheumatologic etiology to his pain. He has no bowel or bladder dysfunction, saddle  anesthesia or abnormal neuro findings to suggest spinal cord injury or pathology.  Back pain likely MSK etiology with possible DDD/nerve compression given distribution of pain and symptoms.  Final Clinical Impressions(s) / ED Diagnoses  1. Low Back Pain with Sciatica. Flexeril 47m TID PRN and Ibuprofen 8043mTID PRN prescribed. No indication for imaging today. Advised to follow-up with his PCP.  Dispo: Home. After thorough clinical evaluation, this patient is determined to be medically stable and can be safely discharged with the previously mentioned treatment and/or outpatient follow-up/referral(s). At this time, there are no other apparent medical conditions that require further screening, evaluation or treatment.  Final diagnoses:  Acute right-sided low back pain with right-sided sciatica    ED Discharge Orders  Ordered    cyclobenzaprine (FLEXERIL) 5 MG tablet  3 times daily     02/05/18 2123    ibuprofen (ADVIL,MOTRIN) 800 MG tablet  3 times daily     02/05/18 2123        Junita Push 02/05/18 2159    Charlesetta Shanks, MD 02/08/18 [phone]

## 2018-02-05 NOTE — Discharge Instructions (Signed)
You may take ibuprofen to assist with inflammation. I have also prescribed a muscle relaxer, Flexeril, to help if you have any muscle spasms. You may follow-up with your PCP who can provide refills for both. Heat or cold compresses may also provide additional relief. I have supplied you with some back exercises as well that you can do to help.  I hope you heal soon so you can get back to work. 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

## 2018-02-05 NOTE — ED Triage Notes (Signed)
Pt c/o LBP, was seen @ hospital in [place] & given abx for kidney infection.  Pt now c/o radiation into RLE.

## 2018-02-07 LAB — URINE CULTURE: Culture: <10000 — AB

## 2018-02-09 ENCOUNTER — Ambulatory Visit (INDEPENDENT_AMBULATORY_CARE_PROVIDER_SITE_OTHER): Payer: Self-pay | Admitting: Family Medicine

## 2018-02-09 VITALS — BP 145/87 | HR 116 | Temp 98.2°F | Wt 214.6 lb

## 2018-02-09 DIAGNOSIS — M5431 Sciatica, right side: Secondary | ICD-10-CM

## 2018-02-09 MED ORDER — GABAPENTIN 100 MG PO CAPS: 100.0000 mg | ORAL_CAPSULE | Freq: Three times a day (TID) | ORAL | 0 refills | Status: DC

## 2018-02-09 NOTE — Patient Instructions (Addendum)
It was great meeting you today! I think that your symptoms are consistent with something called "sciatica" which is inflammation of your sciatic nerve. It is most likely due to some muscle irritation which is causing compression of that nerve root. The muscle irritation could be due to your kidney infection. I would continue the muscle relaxer. I will also add on a new medication called gabapentin, which is helpful for decreasing nerve irritation. I will give you a 2 weeks supply which should help you get through work while the muscle injury heals. I think you are ok to go back to work on Friday, as long as you are feeling up to it.

## 2018-02-10 ENCOUNTER — Encounter: Payer: Self-pay | Admitting: Family Medicine

## 2018-02-10 NOTE — Assessment & Plan Note (Signed)
Patient with history and physical exam findings consistent with sciatica. Likely had a small muscle strain due to physical deconditioning after short admission in the hospital. This is likely providing the irritation to his sciatic nerve. Will give him a short course of gabapentin along with the flexeril he was given in the ED. If symptoms persist he will need to be seen by PCP for further management. - continue flexeril as prescribed by ED - gabapentin 100mg  tid prn for 3 weeks

## 2018-02-10 NOTE — Progress Notes (Signed)
   HPI 47 year old male who presents with right back pain and right leg pain. Patient states that around one week ago he developed a kidney injection and went to the hospital in [place]. There are no records from this admission. He states that he has been taking his antibiotics as prescribed and he is around 1 week into a 2 week course. He is feeling much better and is not having any sequelae from his infection.  He states that on 07/06 he developed intense, right sided back pain. It was a sharp, shooting back pain that caused him significant physical limitation.  He was seen in the [facility] and he was started on flexeril. This has helped him a lot in terms of his overall pain. He states that he is occasionally having a sharp burning pain which starts in his lower back and shooting down his right leg.  CC: right back and leg pain  ROS:   Review of Systems See HPI for ROS.   CC, SH/smoking status, and VS noted  Objective: BP (!) 145/87 (BP Location: Left Arm, Patient Position: Sitting, Cuff Size: Normal)   Pulse (!) 116   Temp 98.2 F (36.8 C) (Oral)   Wt 214 lb 9.6 oz (97.3 kg)   SpO2 98%   BMI 30.79 kg/m  Gen: NAD, alert, cooperative, and pleasant. African American Male resting comfortably in bed HEENT: NCAT, EOMI, PERRL CV: RRR, no murmur Resp: CTAB, no wheezes, non-labored Abd: SNTND, BS present, no guarding or organomegaly Ext: No edema, warm Neuro: Alert and oriented, Speech clear, No gross deficits. + straight leg test. Has intense burning sensation when flexes hip. No focal neuro deficits. Cn 2-12 intact. No gait abnormality.  Assessment and plan:  Sciatica of right side Patient with history and physical exam findings consistent with sciatica. Likely had a small muscle strain due to physical deconditioning after short admission in the hospital. This is likely providing the irritation to his sciatic nerve. Will give him a short course of gabapentin along with the  flexeril he was given in the ED. If symptoms persist he will need to be seen by PCP for further management. - continue flexeril as prescribed by ED - gabapentin 100mg  tid prn for 3 weeks   No orders of the defined types were placed in this encounter.   Meds ordered this encounter  Medications  . gabapentin (NEURONTIN) 100 MG capsule    Sig: Take 1 capsule (100 mg total) by mouth 3 (three) times daily for 20 days.    Dispense:  60 capsule    Refill:  0   Guadalupe Dawn MD PGY-2 Family Medicine Resident  02/10/2018 11:17 AM

## 2018-02-25 ENCOUNTER — Other Ambulatory Visit: Payer: Self-pay | Admitting: Family Medicine

## 2018-02-25 NOTE — Telephone Encounter (Signed)
Please have the patient come in for an appointment.  I suspect his symptoms haven't improved if he is requesting more medication.

## 2018-03-01 NOTE — Telephone Encounter (Signed)
LVM to call office back to inform him of below and assist him in getting this appointment scheduled.  Katharina Caper, April D, [name]

## 2018-03-03 NOTE — Telephone Encounter (Signed)
2nd attempt to reach pt to inform him of below and to assist him in getting an appointment. Please inform him and schedule him an appointment if he calls back. Katharina Caper, April D, [name]

## 2018-11-26 ENCOUNTER — Emergency Department (HOSPITAL_COMMUNITY): Payer: PRIVATE HEALTH INSURANCE

## 2018-11-26 ENCOUNTER — Encounter (HOSPITAL_COMMUNITY): Payer: Self-pay | Admitting: Emergency Medicine

## 2018-11-26 ENCOUNTER — Other Ambulatory Visit: Payer: Self-pay

## 2018-11-26 ENCOUNTER — Inpatient Hospital Stay (HOSPITAL_COMMUNITY)
Admission: EM | Admit: 2018-11-26 | Discharge: 2018-11-29 | DRG: 286 | Disposition: A | Discharge status: Home / Self Care | Payer: PRIVATE HEALTH INSURANCE | Attending: Family Medicine | Admitting: Family Medicine

## 2018-11-26 DIAGNOSIS — E785 Hyperlipidemia, unspecified: Secondary | ICD-10-CM | POA: Diagnosis present

## 2018-11-26 DIAGNOSIS — Z809 Family history of malignant neoplasm, unspecified: Secondary | ICD-10-CM

## 2018-11-26 DIAGNOSIS — N182 Chronic kidney disease, stage 2 (mild): Secondary | ICD-10-CM | POA: Diagnosis present

## 2018-11-26 DIAGNOSIS — N179 Acute kidney failure, unspecified: Secondary | ICD-10-CM | POA: Diagnosis present

## 2018-11-26 DIAGNOSIS — E669 Obesity, unspecified: Secondary | ICD-10-CM

## 2018-11-26 DIAGNOSIS — I43 Cardiomyopathy in diseases classified elsewhere: Secondary | ICD-10-CM | POA: Diagnosis present

## 2018-11-26 DIAGNOSIS — Z23 Encounter for immunization: Secondary | ICD-10-CM | POA: Diagnosis not present

## 2018-11-26 DIAGNOSIS — R0609 Other forms of dyspnea: Secondary | ICD-10-CM | POA: Diagnosis not present

## 2018-11-26 DIAGNOSIS — R0602 Shortness of breath: Secondary | ICD-10-CM | POA: Diagnosis present

## 2018-11-26 DIAGNOSIS — E1122 Type 2 diabetes mellitus with diabetic chronic kidney disease: Secondary | ICD-10-CM | POA: Diagnosis present

## 2018-11-26 DIAGNOSIS — E1169 Type 2 diabetes mellitus with other specified complication: Secondary | ICD-10-CM | POA: Diagnosis present

## 2018-11-26 DIAGNOSIS — I509 Heart failure, unspecified: Secondary | ICD-10-CM

## 2018-11-26 DIAGNOSIS — I130 Hypertensive heart and chronic kidney disease with heart failure and stage 1 through stage 4 chronic kidney disease, or unspecified chronic kidney disease: Principal | ICD-10-CM | POA: Diagnosis present

## 2018-11-26 DIAGNOSIS — I5023 Acute on chronic systolic (congestive) heart failure: Secondary | ICD-10-CM | POA: Diagnosis present

## 2018-11-26 DIAGNOSIS — Z880 Allergy status to penicillin: Secondary | ICD-10-CM

## 2018-11-26 DIAGNOSIS — I5022 Chronic systolic (congestive) heart failure: Secondary | ICD-10-CM | POA: Diagnosis present

## 2018-11-26 DIAGNOSIS — I5021 Acute systolic (congestive) heart failure: Secondary | ICD-10-CM | POA: Diagnosis not present

## 2018-11-26 DIAGNOSIS — I420 Dilated cardiomyopathy: Secondary | ICD-10-CM | POA: Diagnosis not present

## 2018-11-26 DIAGNOSIS — I5030 Unspecified diastolic (congestive) heart failure: Secondary | ICD-10-CM | POA: Diagnosis present

## 2018-11-26 DIAGNOSIS — I428 Other cardiomyopathies: Secondary | ICD-10-CM | POA: Diagnosis present

## 2018-11-26 DIAGNOSIS — I10 Essential (primary) hypertension: Secondary | ICD-10-CM | POA: Diagnosis not present

## 2018-11-26 DIAGNOSIS — Z8249 Family history of ischemic heart disease and other diseases of the circulatory system: Secondary | ICD-10-CM | POA: Diagnosis not present

## 2018-11-26 DIAGNOSIS — Z794 Long term (current) use of insulin: Secondary | ICD-10-CM

## 2018-11-26 DIAGNOSIS — M5431 Sciatica, right side: Secondary | ICD-10-CM | POA: Diagnosis present

## 2018-11-26 DIAGNOSIS — E119 Type 2 diabetes mellitus without complications: Secondary | ICD-10-CM | POA: Diagnosis present

## 2018-11-26 DIAGNOSIS — E1159 Type 2 diabetes mellitus with other circulatory complications: Secondary | ICD-10-CM | POA: Diagnosis not present

## 2018-11-26 HISTORY — DX: Type 2 diabetes mellitus with other specified complication: E11.69

## 2018-11-26 HISTORY — DX: Dilated cardiomyopathy: I42.0

## 2018-11-26 HISTORY — DX: Other forms of dyspnea: R06.09

## 2018-11-26 HISTORY — DX: Obesity, unspecified: E66.9

## 2018-11-26 HISTORY — DX: Dyspnea, unspecified: R06.00

## 2018-11-26 LAB — BASIC METABOLIC PANEL
Anion gap: 11 (ref 5–15)
BUN: 27 mg/dL — ABNORMAL HIGH (ref 6–20)
CO2: 21 mmol/L — ABNORMAL LOW (ref 22–32)
Calcium: 8.8 mg/dL — ABNORMAL LOW (ref 8.9–10.3)
Chloride: 107 mmol/L (ref 98–111)
Creatinine, Ser: 1.41 mg/dL — ABNORMAL HIGH (ref 0.61–1.24)
GFR calc Af Amer: >60 mL/min (ref >60)
GFR calc non Af Amer: 58 mL/min — ABNORMAL LOW (ref >60)
Glucose, Bld: 138 mg/dL — ABNORMAL HIGH (ref 70–99)
Potassium: 4.5 mmol/L (ref 3.5–5.1)
Sodium: 139 mmol/L (ref 135–145)

## 2018-11-26 LAB — BRAIN NATRIURETIC PEPTIDE: B Natriuretic Peptide: 778.4 pg/mL — ABNORMAL HIGH (ref 0.0–100.0)

## 2018-11-26 LAB — TSH: TSH: 2.542 u[IU]/mL (ref 0.350–4.500)

## 2018-11-26 LAB — CBC WITH DIFFERENTIAL/PLATELET
Abs Immature Granulocytes: 0.02 10*3/uL (ref 0.00–0.07)
Basophils Absolute: 0.1 10*3/uL (ref 0.0–0.1)
Basophils Relative: 1 %
Eosinophils Absolute: 0.2 10*3/uL (ref 0.0–0.5)
Eosinophils Relative: 3 %
HCT: 47.8 % (ref 39.0–52.0)
Hemoglobin: 15 g/dL (ref 13.0–17.0)
Immature Granulocytes: 0 %
Lymphocytes Relative: 37 %
Lymphs Abs: 2.3 10*3/uL (ref 0.7–4.0)
MCH: 26.4 pg (ref 26.0–34.0)
MCHC: 31.4 g/dL (ref 30.0–36.0)
MCV: 84.2 fL (ref 80.0–100.0)
Monocytes Absolute: 0.5 10*3/uL (ref 0.1–1.0)
Monocytes Relative: 8 %
Neutro Abs: 3.2 10*3/uL (ref 1.7–7.7)
Neutrophils Relative %: 51 %
Platelets: 273 10*3/uL (ref 150–400)
RBC: 5.68 MIL/uL (ref 4.22–5.81)
RDW: 15.6 % — ABNORMAL HIGH (ref 11.5–15.5)
WBC: 6.2 10*3/uL (ref 4.0–10.5)
nRBC: 0 % (ref 0.0–0.2)

## 2018-11-26 LAB — TROPONIN I
Troponin I: 0.05 ng/mL (ref <0.03)
Troponin I: 0.05 ng/mL (ref <0.03)
Troponin I: 0.05 ng/mL (ref <0.03)

## 2018-11-26 LAB — PROTIME-INR
INR: 1.2 (ref 0.8–1.2)
Prothrombin Time: 14.8 seconds (ref 11.4–15.2)

## 2018-11-26 LAB — D-DIMER, QUANTITATIVE: D-Dimer, Quant: 1.12 ug/mL-FEU — ABNORMAL HIGH (ref 0.00–0.50)

## 2018-11-26 LAB — HEMOGLOBIN A1C
Hgb A1c MFr Bld: 6 % — ABNORMAL HIGH (ref 4.8–5.6)
Mean Plasma Glucose: 125.5 mg/dL

## 2018-11-26 MED ORDER — IOHEXOL 300 MG/ML  SOLN: 75.0000 mL | Freq: Once | INTRAMUSCULAR | Status: DC | PRN

## 2018-11-26 MED ORDER — FUROSEMIDE 10 MG/ML IJ SOLN
40.0000 mg | Freq: Two times a day (BID) | INTRAMUSCULAR | Status: DC
  Administered 2018-11-27 – 2018-11-29 (×4): 40 mg via INTRAVENOUS
  Filled 2018-11-26 (×5): qty 4

## 2018-11-26 MED ORDER — ONDANSETRON HCL 4 MG/2ML IJ SOLN: 4.0000 mg | Freq: Four times a day (QID) | INTRAMUSCULAR | Status: DC | PRN

## 2018-11-26 MED ORDER — SODIUM CHLORIDE 0.9 % IV SOLN: 250.0000 mL | INTRAVENOUS | Status: DC | PRN

## 2018-11-26 MED ORDER — HEPARIN (PORCINE) 25000 UT/250ML-% IV SOLN
1200.0000 [IU]/h | INTRAVENOUS | Status: DC
  Administered 2018-11-26 – 2018-11-27 (×2): 1200 [IU]/h via INTRAVENOUS
  Filled 2018-11-26 (×3): qty 250

## 2018-11-26 MED ORDER — FUROSEMIDE 10 MG/ML IJ SOLN
40.0000 mg | Freq: Once | INTRAMUSCULAR | Status: AC
  Administered 2018-11-26: 40 mg via INTRAVENOUS
  Filled 2018-11-26: qty 4

## 2018-11-26 MED ORDER — NITROGLYCERIN 2 % TD OINT
1.0000 [in_us] | TOPICAL_OINTMENT | Freq: Once | TRANSDERMAL | Status: AC
  Administered 2018-11-26: 1 [in_us] via TOPICAL
  Filled 2018-11-26: qty 1

## 2018-11-26 MED ORDER — ASPIRIN EC 81 MG PO TBEC
81.0000 mg | DELAYED_RELEASE_TABLET | Freq: Every day | ORAL | Status: DC
  Administered 2018-11-26 – 2018-11-29 (×4): 81 mg via ORAL
  Filled 2018-11-26 (×4): qty 1

## 2018-11-26 MED ORDER — CARVEDILOL 3.125 MG PO TABS
3.1250 mg | ORAL_TABLET | Freq: Two times a day (BID) | ORAL | Status: DC
  Administered 2018-11-27 (×2): 3.125 mg via ORAL
  Filled 2018-11-26 (×2): qty 1

## 2018-11-26 MED ORDER — PNEUMOCOCCAL VAC POLYVALENT 25 MCG/0.5ML IJ INJ
0.5000 mL | INJECTION | INTRAMUSCULAR | Status: AC
  Administered 2018-11-27: 0.5 mL via INTRAMUSCULAR
  Filled 2018-11-26: qty 0.5

## 2018-11-26 MED ORDER — SODIUM CHLORIDE 0.9% FLUSH: 3.0000 mL | INTRAVENOUS | Status: DC | PRN

## 2018-11-26 MED ORDER — IOHEXOL 350 MG/ML SOLN
75.0000 mL | Freq: Once | INTRAVENOUS | Status: AC | PRN
  Administered 2018-11-26: 75 mL via INTRAVENOUS

## 2018-11-26 MED ORDER — ACETAMINOPHEN 325 MG PO TABS: 650.0000 mg | ORAL_TABLET | ORAL | Status: DC | PRN

## 2018-11-26 MED ORDER — LISINOPRIL 5 MG PO TABS
5.0000 mg | ORAL_TABLET | Freq: Every day | ORAL | Status: DC
  Administered 2018-11-26 – 2018-11-29 (×4): 5 mg via ORAL
  Filled 2018-11-26 (×4): qty 1

## 2018-11-26 MED ORDER — SODIUM CHLORIDE 0.9% FLUSH
3.0000 mL | Freq: Two times a day (BID) | INTRAVENOUS | Status: DC
  Administered 2018-11-26 – 2018-11-29 (×4): 3 mL via INTRAVENOUS

## 2018-11-26 MED ORDER — HEPARIN BOLUS VIA INFUSION
4000.0000 [IU] | Freq: Once | INTRAVENOUS | Status: AC
  Administered 2018-11-26: 4000 [IU] via INTRAVENOUS
  Filled 2018-11-26: qty 4000

## 2018-11-26 NOTE — ED Triage Notes (Signed)
Pt arrives from home complaining of shortness of breath x2 weeks. Pt states that SOB increases when laying down.

## 2018-11-26 NOTE — H&P (Addendum)
[facility] Admission History and Physical Service Pager: [phone]  Patient name: Nicholas Knight Medical record number: [id number] Date of birth: [birth year] Age: 48 y.o. Gender: male  Primary Care Provider: Cleophas Dunker, DO Consultants: cardiology Code Status: full Preferred Emergency Contact: girlfriend Nicholas Knight  Chief Complaint: DOE, chest discomfort  Assessment and Plan: Nicholas Knight is a 48 y.o. male presenting with likely new onset heart failure with unknown exacerbation. PMH is significant for HTN, IDT2DM  ACS r/o vs new onset heart failure- no history of heart disease but long standing HTN. Family history positive for father MI in his 76s and had a pacemaker. Patient tachycardic, tachypneic and hypertensive on admission but stable on room air. Chest xray showed vascular congestion and cardiomegaly. D-dimer was  elevated, ED ordered CTA chest which was negative for PE. 2 initial troponins 0.05, elevated BNP. ECG positive for ischemia, but no ST changes. Patient received lasix in ED. Currently asymptomatic. Exam relatively normal. RRR, no murmur, lungs clear to auscultation, no LE edema, positive for mild JVD bilaterally. Insidious onset of symptoms without specific precipitating cause or personal or similar family history is interesting. Patient may have underlying condition contributing to myocarditis- chest CT not particularly obvious for perihilar lymph nodes so sarcoid less likely. Denies any illicit substance use  - admit to cards tele, attending Dr. Owens Shark - continuous cardiac monitoring - cards consulted, appreciate recs - initiate heparin drip - trend troponins - echo - repeat ECG am - coronary angiogram and/or cardiac MRI Monday per cards - diurese. 60m IV lasix BID for now and monitor respiratory status and urine output. Will also monitor BMP daily for electrolyte abnormalities - ASA - incentive spirometry - CBC am - lipid panel -  TSH - PT/INR - initiated carvedilol - continue home lisinopril 561mdaily for HTN - plan to initiate statin - cardiac rehab on discharge - vitals per floor protocol  - daily weights - monitor I/Os  IDDMT2- patient endorses diagnosis about 10 years ago. Home meds: metformin, claims 28 levemir with sliding scale of short acting and says cbgs always low 100s which is questionable but A1c pending,  gabapentin. Glucose 138 on admission. It's apparent that patient is inconsistent with his insulin. He intermittently checks his blood sugars, takes insulin only on some days and sometimes mornings or nights. Patient will benefit from diabetes education prior to discharge, especially as the importance to it's contribution to vascular disease which I have discussed with patient already. - hold metformin and gabapentin - CBGs x4 daily - sSSI, may add on basal after seeing what his sugars are running - Hgb A1c  AKI- Cr 1.41 on admission and GFR >60. - monitor with BMP am - avoid nephrotoxic agents.  FEN/GI: heart healthy, carb-modified diet, zofran PRN Prophylaxis: on heparin gtt, will   Disposition: admit to card tele  History of Present Illness:  WiWaylen Knight a previously healthy [age].o. male with one month of dry cough presenting with DOE, exertional chest pain, and PND x 2 weeks. Patient denies current symptoms. Patient states he otherwise feels healthy and normal except for when he has the SOB and chest pain. 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 He has been sleeping slightly elevated for a couple weeks. He denies lower extremity edema, palpitations, sputum production, diaphoresis, weight loss, fever, recent illness.  Review Of Systems: Per HPI with the following additions:   Review of Systems  Constitutional: Negative for chills and fever.  HENT: Negative for  congestion and sore throat.   Respiratory: Positive for cough (dry x1 month) and shortness of breath (DOE and with lying flat). Negative for sputum production.   Cardiovascular: Positive for chest pain, orthopnea and PND. Negative for palpitations, claudication and leg swelling.  Gastrointestinal: Negative for abdominal pain, constipation, diarrhea, nausea and vomiting.  Genitourinary: Negative for dysuria.  Neurological: Negative.   Psychiatric/Behavioral: Negative.     Patient Active Problem List   Diagnosis Date Noted  . Sciatica of right side 02/09/2018  . Blurred vision 09/13/2015  . Diabetes ([place]) 09/12/2015  . Hypertension 07/07/2012    Past Medical History: Past Medical History:  Diagnosis Date  . Hypertension     Past Surgical History: History reviewed. No pertinent surgical history.  Social History: Social History   Tobacco Use  . Smoking status: Never Smoker  . Smokeless tobacco: Never Used  Substance Use Topics  . Alcohol use: No  . Drug use: No   Additional social history: denies tobacco, alcohol, illicit drug use  Please also refer to relevant sections of EMR.  Family History: Family History  Problem Relation Age of Onset  . Cancer Mother   . Lupus Maternal Grandmother   father with MI in his 56s and had a pacemaker. Patient states he died from consequences of agent orange.  Allergies and Medications: Allergies  Allergen Reactions  . Penicillins Other (See Comments)    Childhood; uknown reaction   No current facility-administered medications on file prior to encounter.    Current Outpatient Medications on File Prior to Encounter  Medication Sig Dispense Refill  . blood glucose meter kit and supplies Dispense based on patient and insurance preference. Use up to four times daily as directed. (FOR ICD-9 250.00, 250.01). 1 each 0  . Insulin Pen Needle 31G X 5 MM MISC BD Pen Needles- brand specific Inject insulin via insulin pen 6 x daily 200 each 3   . lisinopril (PRINIVIL,ZESTRIL) 5 MG tablet TAKE 1 TABLET BY MOUTH EVERY DAY 90 tablet 1  . metFORMIN (GLUCOPHAGE) 500 MG tablet TAKE 1 TABLET BY MOUTH TWICE A DAY WITH A MEAL 30 tablet 0  . gabapentin (NEURONTIN) 100 MG capsule Take 1 capsule (100 mg total) by mouth 3 (three) times daily for 20 days. 60 capsule 0  . LEVEMIR FLEXTOUCH 100 UNIT/ML Pen INJECT 28 UNITS INTO THE SKIN 2 TIMES DAILY (Patient not taking: Reported on 11/26/2018) 5 pen 0    Objective: BP (!) 157/128   Pulse (!) 104   Temp 98.2 F (36.8 C) (Oral)   Resp (!) 30   Ht '5\' 8"'  (1.727 m)   Wt 90.7 kg   SpO2 97%   BMI 30.41 kg/m  Exam: General: NAD, sitting in chair ENTM: moist mucous membranes, no drainage from nares, eyes, ears Neck: soft, non-tender, painless ROM Cardiovascular: RRR, no murmur appreciated, mild JVD present bilaterally. No LE edema Respiratory: CTAB, no wheezing or increased WOB Gastrointestinal: soft, non-tender to palpation, +BS MSK: normal development Derm: no rashes or lesions Neuro: alert and oriented Psych: normal mood and affect  Labs and Imaging: CBC BMET  Recent Labs  Lab 11/26/18 1313  WBC 6.2  HGB 15.0  HCT 47.8  PLT 273   Recent Labs  Lab 11/26/18 1313  NA 139  K 4.5  CL 107  CO2 21*  BUN  27*  CREATININE 1.41*  GLUCOSE 138*  CALCIUM 8.8*     Dg Chest 2 View  Result Date: 11/26/2018 CLINICAL DATA:  Short of breath for 2 weeks EXAM: CHEST - 2 VIEW COMPARISON:  None. FINDINGS: Moderate cardiomegaly. Vascular congestion. No Kerley B lines. No pneumothorax or pleural effusion. Low lung volumes with vascular crowding. IMPRESSION: Cardiomegaly and vascular congestion consistent with mild CHF. No sign of overt pulmonary edema. Electronically Signed   By: Marybelle Killings M.D.   On: 11/26/2018 14:19   Ct Angio Chest Pe W And/or Wo Contrast  Result Date: 11/26/2018 CLINICAL DATA:  Short of breath EXAM: CT ANGIOGRAPHY CHEST WITH CONTRAST TECHNIQUE: Multidetector CT imaging of  the chest was performed using the standard protocol during bolus administration of intravenous contrast. Multiplanar CT image reconstructions and MIPs were obtained to evaluate the vascular anatomy. CONTRAST:  75m OMNIPAQUE IOHEXOL 350 MG/ML SOLN COMPARISON:  None. FINDINGS: Cardiovascular: There are no filling defects in the pulmonary arterial tree to suggest acute pulmonary thromboembolism. The left ventricle is dilated. There is reflux of contrast into the hepatic veins. Mediastinum/Nodes: No abnormal mediastinal adenopathy. No pericardial effusion. Thyroid is unremarkable esophagus is unremarkable. Lungs/Pleura: Small bilateral pleural effusions and associated dependent atelectasis. There is peribronchovascular soft tissue prominence in the hilar regions. Scattered interlobular septal thickening is noted. Upper Abdomen: No acute abnormality. Musculoskeletal: No vertebral compression deformity. Review of the MIP images confirms the above findings. IMPRESSION: No evidence of acute pulmonary thromboembolism. There is interlobular septal thickening which may represent interstitial edema associated with bilateral pleural effusions, left ventricular dilatation, and hilar soft tissue prominence. These findings can be associated with CHF. Electronically Signed   By: AMarybelle KillingsM.D.   On: 11/26/2018 16:53    ARicharda Osmond DO 11/26/2018, 5:39 PM PGY-1, [place] pager: [phone] text pages welcome

## 2018-11-26 NOTE — Progress Notes (Signed)
Pt arrived to 3E10 from [facility]. A&O x 4. No complaints of pain. VSS. BP elevated. MD aware. Lisinopril given in ED. No skin breakdown. ST on telemetry. Heart failure POC initiated. Patient oriented to room and call system.

## 2018-11-26 NOTE — ED Notes (Signed)
Patient transported to X-ray 

## 2018-11-26 NOTE — ED Provider Notes (Signed)
Care assumed from [place], [place].  Please see her full H&P.  In short,  Nicholas Knight is a 48 y.o. male with a PMH of HTN and diet controlled diabetes presenting for intermittent exertional shortness of breath onset 2 weeks ago. Patient also has substernal chest pressure with shortness of breath episodes. Patient denies sick contacts, recent travel, or known exposure to COVID-19. Patient is asymptomatic while at rest and sitting up.  CXR reveals cardiomegaly and vascular congestion consistent with mild CHF. No sign of overt pulmonary edema. Patient does not have a history of CHF. Troponin elevated at 0.05. EKG reveals sinus tachycardia, left atrial enlargement, and probable left ventricular hypertrophy. BNP elevated at 778.4. Patient was given nitropaste and Lasix 40mg  while in the ER.Patient has tachycardia and tachypnea. D dimer elevated at 1.12. CTA is pending.   CTA reveals no evidence of acute pulmonary thromboembolism. There is interlobular septal thickening which may represent interstitial edema associated with bilateral pleural effusions. Oxygen saturation is stable at 97-100% on room air. Patient is currently stable in no acute distress. Patient will need admission for new onset CHF. Consulted family medicine for admission. Consulted cardiology as family medicine recommended. Cardiology states they will evaluate patient. Family medicine has agreed to admit patient.     Darlin Drop [place], [place] 11/26/18 1749    Tegeler, Gwenyth Allegra, MD 11/26/18 Drema Halon

## 2018-11-26 NOTE — ED Notes (Signed)
ED TO INPATIENT HANDOFF REPORT  ED Nurse Name and Phone #: Joellen Jersey [phone]  S Name/Age/Gender Nicholas Knight 48 y.o. male Room/Bed: 028C/028C  Code Status   Code Status: Prior  Home/SNF/Other Home Patient oriented to: self, place, time and situation Is this baseline? Yes   Triage Complete: Triage complete  Chief Complaint sob  Triage Note Pt arrives from home complaining of shortness of breath x2 weeks. Pt states that SOB increases when laying down.   Allergies Allergies  Allergen Reactions  . Penicillins Other (See Comments)    Childhood; uknown reaction    Level of Care/Admitting Diagnosis ED Disposition    ED Disposition Condition Comment   Admit  Hospital Area: [facility] [[id number]]  Level of Care: Telemetry Cardiac [103]  Covid Evaluation: N/A  Diagnosis: Acute heart failure [facility]) [[id number]]  Admitting Physician: Richarda Osmond [[id number]]  Attending Physician: Martyn Malay [[id number]]  Estimated length of stay: 3 - 4 days  Certification:: I certify this patient will need inpatient services for at least 2 midnights  PT Class (Do Not Modify): Inpatient [101]  PT Acc Code (Do Not Modify): Private [1]       B Medical/Surgery History Past Medical History:  Diagnosis Date  . Hypertension    History reviewed. No pertinent surgical history.   A IV Location/Drains/Wounds Patient Lines/Drains/Airways Status   Active Line/Drains/Airways    Name:   Placement date:   Placement time:   Site:   Days:   Peripheral IV 11/26/18 Left Antecubital   11/26/18    1316    Antecubital   less than 1          Intake/Output Last 24 hours  Intake/Output Summary (Last 24 hours) at 11/26/2018 1838 Last data filed at 11/26/2018 1739 Gross per 24 hour  Intake -  Output 1100 ml  Net -1100 ml    Labs/Imaging Results for orders placed or performed during the hospital encounter of 11/26/18 (from the past 48 hour(s))  Basic metabolic panel     Status:  Abnormal   Collection Time: 11/26/18  1:13 PM  Result Value Ref Range   Sodium 139 135 - 145 mmol/L   Potassium 4.5 3.5 - 5.1 mmol/L   Chloride 107 98 - 111 mmol/L   CO2 21 (L) 22 - 32 mmol/L   Glucose, Bld 138 (H) 70 - 99 mg/dL   BUN 27 (H) 6 - 20 mg/dL   Creatinine, Ser 1.41 (H) 0.61 - 1.24 mg/dL   Calcium 8.8 (L) 8.9 - 10.3 mg/dL   GFR calc non Af Amer 58 (L) >60 mL/min   GFR calc Af Amer >60 >60 mL/min   Anion gap 11 5 - 15    Comment: Performed at [facility], [place] [address] [place],  [address]  CBC with Differential     Status: Abnormal   Collection Time: 11/26/18  1:13 PM  Result Value Ref Range   WBC 6.2 4.0 - 10.5 K/uL   RBC 5.68 4.22 - 5.81 MIL/uL   Hemoglobin 15.0 13.0 - 17.0 g/dL   HCT 47.8 39.0 - 52.0 %   MCV 84.2 80.0 - 100.0 fL   MCH 26.4 26.0 - 34.0 pg   MCHC 31.4 30.0 - 36.0 g/dL   RDW 15.6 (H) 11.5 - 15.5 %   Platelets 273 150 - 400 K/uL   nRBC 0.0 0.0 - 0.2 %   Neutrophils Relative % 51 %   Neutro Abs 3.2 1.7 -  7.7 K/uL   Lymphocytes Relative 37 %   Lymphs Abs 2.3 0.7 - 4.0 K/uL   Monocytes Relative 8 %   Monocytes Absolute 0.5 0.1 - 1.0 K/uL   Eosinophils Relative 3 %   Eosinophils Absolute 0.2 0.0 - 0.5 K/uL   Basophils Relative 1 %   Basophils Absolute 0.1 0.0 - 0.1 K/uL   Immature Granulocytes 0 %   Abs Immature Granulocytes 0.02 0.00 - 0.07 K/uL    Comment: Performed at [facility] [address] [place], [place] [address]  Brain natriuretic peptide     Status: Abnormal   Collection Time: 11/26/18  1:13 PM  Result Value Ref Range   B Natriuretic Peptide 778.4 (H) 0.0 - 100.0 pg/mL    Comment: Performed at [facility] [address], [place], [place] [address]  Troponin I - Add-On to previous collection     Status: Abnormal   Collection Time: 11/26/18  1:13 PM  Result Value Ref Range   Troponin I 0.05 (HH) <0.03 ng/mL    Comment: CRITICAL RESULT CALLED TO, READ BACK BY AND VERIFIED WITH: RON HARDY RN AT 1454  11/26/2018 BY [facility] Performed at [facility], [address] [place], [place] [address]   D-dimer, quantitative (not at [facility])     Status: Abnormal   Collection Time: 11/26/18  1:13 PM  Result Value Ref Range   D-Dimer, Quant 1.12 (H) 0.00 - 0.50 ug/mL-FEU    Comment: (NOTE) At the manufacturer cut-off of 0.50 ug/mL FEU, this assay has been documented to exclude PE with a sensitivity and negative predictive value of 97 to 99%.  At this time, this assay has not been approved by the FDA to exclude DVT/VTE. Results should be correlated with clinical presentation. Performed at [facility], [place] [address], [place],  [address]    Dg Chest 2 View  Result Date: 11/26/2018 CLINICAL DATA:  Short of breath for 2 weeks EXAM: CHEST - 2 VIEW COMPARISON:  None. FINDINGS: Moderate cardiomegaly. Vascular congestion. No Kerley B lines. No pneumothorax or pleural effusion. Low lung volumes with vascular crowding. IMPRESSION: Cardiomegaly and vascular congestion consistent with mild CHF. No sign of overt pulmonary edema. Electronically Signed   By: Marybelle Killings M.D.   On: 11/26/2018 14:19   Ct Angio Chest Pe W And/or Wo Contrast  Result Date: 11/26/2018 CLINICAL DATA:  Short of breath EXAM: CT ANGIOGRAPHY CHEST WITH CONTRAST TECHNIQUE: Multidetector CT imaging of the chest was performed using the standard protocol during bolus administration of intravenous contrast. Multiplanar CT image reconstructions and MIPs were obtained to evaluate the vascular anatomy. CONTRAST:  63mL OMNIPAQUE IOHEXOL 350 MG/ML SOLN COMPARISON:  None. FINDINGS: Cardiovascular: There are no filling defects in the pulmonary arterial tree to suggest acute pulmonary thromboembolism. The left ventricle is dilated. There is reflux of contrast into the hepatic veins. Mediastinum/Nodes: No abnormal mediastinal adenopathy. No pericardial effusion. Thyroid is unremarkable esophagus is unremarkable. Lungs/Pleura: Small  bilateral pleural effusions and associated dependent atelectasis. There is peribronchovascular soft tissue prominence in the hilar regions. Scattered interlobular septal thickening is noted. Upper Abdomen: No acute abnormality. Musculoskeletal: No vertebral compression deformity. Review of the MIP images confirms the above findings. IMPRESSION: No evidence of acute pulmonary thromboembolism. There is interlobular septal thickening which may represent interstitial edema associated with bilateral pleural effusions, left ventricular dilatation, and hilar soft tissue prominence. These findings can be associated with CHF. Electronically Signed   By: Arnell Sieving  Hoss M.D.   On: 11/26/2018 16:53    Pending Labs Unresulted Labs (From admission, onward)    Start     Ordered   11/26/18 1800  Hemoglobin A1c  Once,   R     11/26/18 1800   11/26/18 1703  Troponin I - ONCE - STAT  ONCE - STAT,   STAT     11/26/18 1702          Vitals/Pain Today's Vitals   11/26/18 1515 11/26/18 1530 11/26/18 1645 11/26/18 1800  BP: (!) 147/116  (!) 157/128 (!) 161/122  Pulse: (!) 103  (!) 104 91  Resp: (!) 31  (!) 30 (!) 26  Temp:      TempSrc:      SpO2: 100%  97% 93%  Weight:      Height:      PainSc:  0-No pain      Isolation Precautions No active isolations  Medications Medications  iohexol (OMNIPAQUE) 300 MG/ML solution 75 mL (has no administration in time range)  furosemide (LASIX) injection 40 mg (40 mg Intravenous Given 11/26/18 1525)  nitroGLYCERIN (NITROGLYN) 2 % ointment 1 inch (1 inch Topical Given 11/26/18 1525)  iohexol (OMNIPAQUE) 350 MG/ML injection 75 mL (75 mLs Intravenous Contrast Given 11/26/18 1604)    Mobility walks Low fall risk   Focused Assessments Cardiac Assessment Handoff:  Cardiac Rhythm: Sinus tachycardia Lab Results  Component Value Date   TROPONINI 0.05 (HH) 11/26/2018   Lab Results  Component Value Date   DDIMER 1.12 (H) 11/26/2018   Does the Patient currently have  chest pain? No     R Recommendations: See Admitting Provider Note  Report given to:   Additional Notes:

## 2018-11-26 NOTE — Progress Notes (Signed)
ANTICOAGULATION CONSULT NOTE - Initial Consult  Pharmacy Consult for heparin  Indication: chest pain/ACS  Allergies  Allergen Reactions  . Penicillins Other (See Comments)    Childhood; uknown reaction    Patient Measurements: Height: 5\' 8"  (172.7 cm) Weight: 200 lb (90.7 kg) IBW/kg (Calculated) : 68.4 Heparin Dosing Weight: 87kg  Vital Signs: Temp: 98.2 F (36.8 C) (04/25 1251) Temp Source: Oral (04/25 1251) BP: 153/122 (04/25 1830) Pulse Rate: 104 (04/25 1830)  Labs: Recent Labs    11/26/18 1313 11/26/18 1737  HGB 15.0  --   HCT 47.8  --   PLT 273  --   CREATININE 1.41*  --   TROPONINI 0.05* 0.05*    Estimated Creatinine Clearance: 69.3 mL/min (A) (by C-G formula based on SCr of 1.41 mg/dL (H)).   Medical History: Past Medical History:  Diagnosis Date  . Hypertension     Assessment: 48 yo male here with SOB abd new onset HF and possible ACS. Pharmacy consulted to dose heparin. No anticoagulants note PTA.  Goal of Therapy:  Heparin level 0.3-0.7 units/ml Monitor platelets by anticoagulation protocol: Yes   Plan:  -Heparin bolus 4000 units IV followed by 1200 units/hr (~ 14 units/kg/hr) -Heparin level in 6 hours and daily wth CBC daily  Hildred Laser, PharmD Clinical Pharmacist **Pharmacist phone directory can now be found on [email or web address] ([email or web address]).  Listed under [place].

## 2018-11-26 NOTE — ED Provider Notes (Signed)
[facility] EMERGENCY DEPARTMENT Provider Note   CSN: [id number] Arrival date & time: 11/26/18  1241    History   Chief Complaint Chief Complaint  Patient presents with  . Shortness of Breath    HPI Nicholas Knight is a 48 y.o. male with history of hypertension, diet controlled diabetes is here for evaluation of shortness of breath.  Described as "feeling more winded" than normal with activity and also with laying flat.  Onset 2 weeks ago.  Has noticed simple walking or activities at home cause him to be short of breath.  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  He has to take a break and take deep breaths.  Shortness of breath is alleviated with rest and sitting up straight.  Associated symptoms include substernal chest pressure.  Reports long history of seasonal allergies that usually cause a cough, this season his cough has been more frequent but still dry, nonproductive.  Takes lisinopril for hypertension but has missed dose in the last couple of days.  He denies sick contacts, recent travel, known exposure to confirmed or suspected COVID.  He denies fever, chills, congestion, sore throat, sputum production, nausea, vomiting, diaphoresis, abdominal pain.  No tobacco use. No other pmh, physical by PCP within last year. His father had an MI and heart failure with a pacemaker in his 8s.      HPI  Past Medical History:  Diagnosis Date  . Hypertension     Patient Active Problem List   Diagnosis Date Noted  . Sciatica of right side 02/09/2018  . Blurred vision 09/13/2015  . Diabetes ([place]) 09/12/2015  . Hypertension 07/07/2012    History reviewed. No pertinent surgical history.      Home Medications    Prior to Admission medications   Medication Sig Start Date End Date Taking? Authorizing Provider  blood glucose meter kit and supplies Dispense based on patient and insurance preference. Use up to four times daily as  directed. (FOR ICD-9 250.00, 250.01). 09/11/15  Yes Gambino, Arlie Solomons, MD  Insulin Pen Needle 31G X 5 MM MISC BD Pen Needles- brand specific Inject insulin via insulin pen 6 x daily 11/04/15  Yes Gambino, Arlie Solomons, MD  lisinopril (PRINIVIL,ZESTRIL) 5 MG tablet TAKE 1 TABLET BY MOUTH EVERY DAY 12/10/16  Yes Carlyle Dolly, MD  metFORMIN (GLUCOPHAGE) 500 MG tablet TAKE 1 TABLET BY MOUTH TWICE A DAY WITH A MEAL 04/19/17  Yes Carlyle Dolly, MD  gabapentin (NEURONTIN) 100 MG capsule Take 1 capsule (100 mg total) by mouth 3 (three) times daily for 20 days. 02/09/18 03/01/18  Guadalupe Dawn, MD  LEVEMIR FLEXTOUCH 100 UNIT/ML Pen INJECT 28 UNITS INTO THE SKIN 2 TIMES DAILY Patient not taking: Reported on 11/26/2018 10/19/16   Carlyle Dolly, MD    Family History Family History  Problem Relation Age of Onset  . Cancer Mother   . Lupus Maternal Grandmother     Social History Social History   Tobacco Use  . Smoking status: Never Smoker  . Smokeless tobacco: Never Used  Substance Use Topics  . Alcohol use: No  . Drug use: No     Allergies   Penicillins   Review of Systems Review of Systems  Respiratory: Positive for shortness of breath.   Cardiovascular: Positive for chest pain (pressure).  All other systems reviewed and are negative.    Physical Exam Updated Vital Signs BP (!) 147/116   Pulse Marland Kitchen)  103   Temp 98.2 F (36.8 C) (Oral)   Resp (!) 31   Ht '5\' 8"'  (1.727 m)   Wt 90.7 kg   SpO2 100%   BMI 30.41 kg/m   Physical Exam Constitutional:      Appearance: He is well-developed.     Comments: NAD. Non toxic.   HENT:     Head: Normocephalic and atraumatic.     Nose: Nose normal.  Eyes:     General: Lids are normal.     Conjunctiva/sclera: Conjunctivae normal.  Neck:     Musculoskeletal: Normal range of motion.     Trachea: Trachea normal.     Comments: Trachea midline.  Cardiovascular:     Rate and Rhythm: Regular rhythm. Tachycardia present.      Pulses:          Radial pulses are 1+ on the right side and 1+ on the left side.       Dorsalis pedis pulses are 1+ on the right side and 1+ on the left side.     Heart sounds: Normal heart sounds, S1 normal and S2 normal.     Comments: No LE edema or calf tenderness.  Pulmonary:     Effort: Pulmonary effort is normal. Tachypnea present.     Breath sounds: Normal breath sounds.     Comments: Worsening tachypnea and patient complains of SOB with HOB flat  Abdominal:     General: Bowel sounds are normal.     Palpations: Abdomen is soft.     Tenderness: There is no abdominal tenderness.     Comments: No epigastric tenderness. No distention.   Skin:    General: Skin is warm and dry.     Capillary Refill: Capillary refill takes less than 2 seconds.     Comments: No rash to chest wall  Neurological:     Mental Status: He is alert.     GCS: GCS eye subscore is 4. GCS verbal subscore is 5. GCS motor subscore is 6.  Psychiatric:        Speech: Speech normal.        Behavior: Behavior normal.        Thought Content: Thought content normal.      ED Treatments / Results  Labs (all labs ordered are listed, but only abnormal results are displayed) Labs Reviewed  BASIC METABOLIC PANEL - Abnormal; Notable for the following components:      Result Value   CO2 21 (*)    Glucose, Bld 138 (*)    BUN 27 (*)    Creatinine, Ser 1.41 (*)    Calcium 8.8 (*)    GFR calc non Af Amer 58 (*)    All other components within normal limits  CBC WITH DIFFERENTIAL/PLATELET - Abnormal; Notable for the following components:   RDW 15.6 (*)    All other components within normal limits  BRAIN NATRIURETIC PEPTIDE - Abnormal; Notable for the following components:   B Natriuretic Peptide 778.4 (*)    All other components within normal limits  TROPONIN I - Abnormal; Notable for the following components:   Troponin I 0.05 (*)    All other components within normal limits  D-DIMER, QUANTITATIVE (NOT AT [facility]) -  Abnormal; Notable for the following components:   D-Dimer, Quant 1.12 (*)    All other components within normal limits    EKG EKG Interpretation  Date/Time:  Saturday November 26 2018 13:15:27 EDT Ventricular Rate:  107 PR Interval:  QRS Duration: 81 QT Interval:  344 QTC Calculation: 459 R Axis:   -28 Text Interpretation:  Sinus tachycardia Left atrial enlargement Probable left ventricular hypertrophy Confirmed by Gerlene Fee [phone]) on 11/26/2018 3:34:18 PM   Radiology Dg Chest 2 View  Result Date: 11/26/2018 CLINICAL DATA:  Short of breath for 2 weeks EXAM: CHEST - 2 VIEW COMPARISON:  None. FINDINGS: Moderate cardiomegaly. Vascular congestion. No Kerley B lines. No pneumothorax or pleural effusion. Low lung volumes with vascular crowding. IMPRESSION: Cardiomegaly and vascular congestion consistent with mild CHF. No sign of overt pulmonary edema. Electronically Signed   By: Marybelle Killings M.D.   On: 11/26/2018 14:19    Procedures Procedures (including critical care time)  Medications Ordered in ED Medications  furosemide (LASIX) injection 40 mg (40 mg Intravenous Given 11/26/18 1525)  nitroGLYCERIN (NITROGLYN) 2 % ointment 1 inch (1 inch Topical Given 11/26/18 1525)     Initial Impression / Assessment and Plan / ED Course  I have reviewed the triage vital signs and the nursing notes.  Pertinent labs & imaging results that were available during my care of the patient were reviewed by me and considered in my medical decision making (see chart for details).  Clinical Course as of Nov 25 1549  Sat Nov 26, 2018  1404 Creatinine(!): 1.41 [CG]  1405 BUN(!): 27 [CG]  1426 IMPRESSION: Cardiomegaly and vascular congestion consistent with mild CHF. No sign of overt pulmonary edema.  DG Chest 2 View [CG]  1455 B Natriuretic Peptide(!): 778.4 [CG]  1455 D-Dimer, Quant(!): 1.12 [CG]  1455 Troponin I(!!): 0.05 [CG]    Clinical Course User Index [CG] Kinnie Feil, PA-C       ddx includes bronchospasm from reactive airway disease/allergies vs unstable angina vs PE vs new onset HF.    Hypertensive in setting of med non compliance, tachypnea, tachycardia and orthopnea on exam. No overt hypervolemia or signs of blood clot on exam.  Tachycardia and cannot PERC out, I have low suspicion for PE will get d-dimer, labs, EKG, CXR, BNP although appears euvolemic.   Final Clinical Impressions(s) / ED Diagnoses   1540: Elevated BNP with cardiomegaly and vascular congestion suggestive of mild CHF.  D-dimer elevated, this could be from PE.  Troponin elevated likely demand ischemia, he is not having CP here.  Lasix and nitro paste given. VSS. Discussed results with pt.  Pending CTA to r/o PE.  Discussed with EDMD.   1550: Pt handed off to oncoming EDPA who will f/u on CTA.  I anticipate admission for new onset HF, serial cardiac enzymes, may benefit from echo. Low suspicion for COVID-19 given lack of other symptoms, no known exposures, will defer testing.   Final diagnoses:  Exertional dyspnea  New onset of congestive heart failure [facility])    ED Discharge Orders    None       Kinnie Feil, PA-C 11/26/18 1552    Maudie Flakes, MD 11/27/18 [phone]

## 2018-11-26 NOTE — Consult Note (Signed)
Cardiology Consult    Patient ID: Nicholas Knight MRN: [id number], DOB/AGE: Mar 27, 1971   Admit date: 11/26/2018 Date of Consult: 11/26/2018  Primary Physician: Cleophas Dunker, DO Primary Cardiologist: No primary care provider on file.   Past Medical History   Past Medical History:  Diagnosis Date   Hypertension     History reviewed. No pertinent surgical history.   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  No significant past medical history otherwise.  Father with a history of heart disease and a pacemaker.  No sudden cardiac death heart failure otherwise in the family.  Does not smoke does not drink does not take illicit drug use.  He presents with 2-week history of progressive shortness of breath, PND, lower extremity edema and orthopnea.  Is been otherwise in very good physical shape until 2 weeks ago.  Has been taking his medication as prescribed his blood pressure has been running 601 093 systolic.  His sugars have been 110.  No recent stressors.  No recent sick exposures, no fever chills or night sweats.   Family History    Family History  Problem Relation Age of Onset   Cancer Mother    Lupus Maternal Grandmother    He indicated that his mother is deceased. He indicated that his father is alive. He indicated that his sister is alive. He indicated that his brother is alive. He indicated that his maternal grandmother is deceased. He indicated that his maternal grandfather is deceased. He indicated that his paternal grandmother is deceased. He indicated that his paternal grandfather is deceased. He indicated that his daughter is alive. He indicated that his son is alive.   Social History    Social History   Socioeconomic History   Marital status: Single    Spouse  name: Not on file   Number of children: Not on file   Years of education: Not on file   Highest education level: Not on file  Occupational History   Not on file  Social Needs   Financial resource strain: Not on file   Food insecurity:    Worry: Not on file    Inability: Not on file   Transportation needs:    Medical: Not on file    Non-medical: Not on file  Tobacco Use   Smoking status: Never Smoker   Smokeless tobacco: Never Used  Substance and Sexual Activity   Alcohol use: No   Drug use: No   Sexual activity: Yes  Lifestyle   Physical activity:    Days per week: Not on file    Minutes per session: Not on file   Stress: Not on file  Relationships   Social connections:    Talks on phone: Not on file    Gets together: Not on file    Attends religious service: Not on file    Active member of club or organization: Not on file    Attends meetings of clubs or organizations: Not on file    Relationship status: Not on file   Intimate partner violence:    Fear of current or ex partner: Not on file    Emotionally abused: Not on file    Physically abused: Not on file    Forced sexual activity: Not on file  Other Topics Concern  Not on file  Social History Narrative   Not on file     Review of Systems    General:  No chills, fever, night sweats or weight changes.  Cardiovascular:  No chest pain, dyspnea on exertion, edema, orthopnea, palpitations, paroxysmal nocturnal dyspnea. Dermatological: No rash, lesions/masses Respiratory: No cough, dyspnea Urologic: No hematuria, dysuria Abdominal:   No nausea, vomiting, diarrhea, bright red blood per rectum, melena, or hematemesis Neurologic:  No visual changes, wkns, changes in mental status. All other systems reviewed and are otherwise negative except as noted above.  Physical Exam    Blood pressure (!) 161/122, pulse 91, temperature 98.2 F (36.8 C), temperature source Oral, resp. rate (!) 26, height 5'  8" (1.727 m), weight 90.7 kg, SpO2 93 %.  General: Pleasant, NAD Psych: Normal affect. Neuro: Alert and oriented X 3. Moves all extremities spontaneously. HEENT: Normal  Neck: JVD present Lungs:  Resp regular and unlabored, CTA. Heart: RRR no s3, s4, or murmurs. Abdomen: Soft, non-tender, non-distended, BS + x 4.  Extremities: No clubbing, cyanosis or edema. DP/PT/Radials 2+ and equal bilaterally.  Labs    Troponin (Point of Care Test) No results for input(s): TROPIPOC in the last 72 hours. Recent Labs    11/26/18 1313  TROPONINI 0.05*   Lab Results  Component Value Date   WBC 6.2 11/26/2018   HGB 15.0 11/26/2018   HCT 47.8 11/26/2018   MCV 84.2 11/26/2018   PLT 273 11/26/2018    Recent Labs  Lab 11/26/18 1313  NA 139  K 4.5  CL 107  CO2 21*  BUN 27*  CREATININE 1.41*  CALCIUM 8.8*  GLUCOSE 138*   No results found for: CHOL, HDL, LDLCALC, TRIG Lab Results  Component Value Date   DDIMER 1.12 (H) 11/26/2018     Radiology Studies    Dg Chest 2 View  Result Date: 11/26/2018 CLINICAL DATA:  Short of breath for 2 weeks EXAM: CHEST - 2 VIEW COMPARISON:  None. FINDINGS: Moderate cardiomegaly. Vascular congestion. No Kerley B lines. No pneumothorax or pleural effusion. Low lung volumes with vascular crowding. IMPRESSION: Cardiomegaly and vascular congestion consistent with mild CHF. No sign of overt pulmonary edema. Electronically Signed   By: Marybelle Killings M.D.   On: 11/26/2018 14:19   Ct Angio Chest Pe W And/or Wo Contrast  Result Date: 11/26/2018 CLINICAL DATA:  Short of breath EXAM: CT ANGIOGRAPHY CHEST WITH CONTRAST TECHNIQUE: Multidetector CT imaging of the chest was performed using the standard protocol during bolus administration of intravenous contrast. Multiplanar CT image reconstructions and MIPs were obtained to evaluate the vascular anatomy. CONTRAST:  61mL OMNIPAQUE IOHEXOL 350 MG/ML SOLN COMPARISON:  None. FINDINGS: Cardiovascular: There are no filling  defects in the pulmonary arterial tree to suggest acute pulmonary thromboembolism. The left ventricle is dilated. There is reflux of contrast into the hepatic veins. Mediastinum/Nodes: No abnormal mediastinal adenopathy. No pericardial effusion. Thyroid is unremarkable esophagus is unremarkable. Lungs/Pleura: Small bilateral pleural effusions and associated dependent atelectasis. There is peribronchovascular soft tissue prominence in the hilar regions. Scattered interlobular septal thickening is noted. Upper Abdomen: No acute abnormality. Musculoskeletal: No vertebral compression deformity. Review of the MIP images confirms the above findings. IMPRESSION: No evidence of acute pulmonary thromboembolism. There is interlobular septal thickening which may represent interstitial edema associated with bilateral pleural effusions, left ventricular dilatation, and hilar soft tissue prominence. These findings can be associated with CHF. Electronically Signed   By: Marybelle Killings M.D.   On: 11/26/2018 16:53  ECG & Cardiac Imaging  Sinus tach with T wave inversion in aVL, borderline LVH  Assessment & Plan    New onset heart failure.  Etiology unclear.  At this time point it is unclear whether this is systolic or diastolic heart failure.  Risk factors include hypertension and diabetes.  Review of CT does not show evidence for sarcoidosis.  His heart appears to be somewhat enlarged.  Thus I think this might of been a subacute presentation.  He is volume up on exam received his first dose of Lasix and responded well to it.  Recommendation: -TTE already ordered -Consider MRI of the heart. -Coronary angiogram on Monday -Trend troponin.  Since the ischemic etiology cannot be ruled out at this time point I would initiate heparin and keep on till Monday  Signed, Cristina Gong, MD 11/26/2018, 6:29 PM  For questions or updates, please contact   Please consult [email or web address] for contact info under Cardiology/STEMI.

## 2018-11-27 ENCOUNTER — Inpatient Hospital Stay (HOSPITAL_COMMUNITY): Payer: PRIVATE HEALTH INSURANCE

## 2018-11-27 DIAGNOSIS — I509 Heart failure, unspecified: Secondary | ICD-10-CM

## 2018-11-27 DIAGNOSIS — I5021 Acute systolic (congestive) heart failure: Secondary | ICD-10-CM

## 2018-11-27 LAB — CBC
HCT: 45.7 % (ref 39.0–52.0)
Hemoglobin: 14.9 g/dL (ref 13.0–17.0)
MCH: 26.6 pg (ref 26.0–34.0)
MCHC: 32.6 g/dL (ref 30.0–36.0)
MCV: 81.5 fL (ref 80.0–100.0)
Platelets: 291 10*3/uL (ref 150–400)
RBC: 5.61 MIL/uL (ref 4.22–5.81)
RDW: 15.4 % (ref 11.5–15.5)
WBC: 7.2 10*3/uL (ref 4.0–10.5)
nRBC: 0 % (ref 0.0–0.2)

## 2018-11-27 LAB — LIPID PANEL
Cholesterol: 159 mg/dL (ref 0–200)
HDL: 37 mg/dL — ABNORMAL LOW (ref >40)
LDL Cholesterol: 100 mg/dL — ABNORMAL HIGH (ref 0–99)
Total CHOL/HDL Ratio: 4.3 RATIO
Triglycerides: 112 mg/dL (ref <150)
VLDL: 22 mg/dL (ref 0–40)

## 2018-11-27 LAB — ECHOCARDIOGRAM COMPLETE
Height: 68 in
Weight: 3339.2 oz

## 2018-11-27 LAB — TROPONIN I
Troponin I: 0.06 ng/mL (ref <0.03)
Troponin I: 0.06 ng/mL (ref <0.03)

## 2018-11-27 LAB — HEPARIN LEVEL (UNFRACTIONATED)
Heparin Unfractionated: 0.37 IU/mL (ref 0.30–0.70)
Heparin Unfractionated: 0.36 IU/mL (ref 0.30–0.70)

## 2018-11-27 LAB — BASIC METABOLIC PANEL
Anion gap: 10 (ref 5–15)
BUN: 24 mg/dL — ABNORMAL HIGH (ref 6–20)
CO2: 24 mmol/L (ref 22–32)
Calcium: 9 mg/dL (ref 8.9–10.3)
Chloride: 107 mmol/L (ref 98–111)
Creatinine, Ser: 1.34 mg/dL — ABNORMAL HIGH (ref 0.61–1.24)
GFR calc Af Amer: >60 mL/min (ref >60)
GFR calc non Af Amer: >60 mL/min (ref >60)
Glucose, Bld: 94 mg/dL (ref 70–99)
Potassium: 4 mmol/L (ref 3.5–5.1)
Sodium: 141 mmol/L (ref 135–145)

## 2018-11-27 LAB — BRAIN NATRIURETIC PEPTIDE: B Natriuretic Peptide: 1015.2 pg/mL — ABNORMAL HIGH (ref 0.0–100.0)

## 2018-11-27 LAB — HIV ANTIBODY (ROUTINE TESTING W REFLEX): HIV Screen 4th Generation wRfx: NONREACTIVE

## 2018-11-27 MED ORDER — GUAIFENESIN-DM 100-10 MG/5ML PO SYRP
5.0000 mL | ORAL_SOLUTION | ORAL | Status: DC | PRN
  Administered 2018-11-27 (×2): 5 mL via ORAL
  Filled 2018-11-27 (×2): qty 5

## 2018-11-27 NOTE — Progress Notes (Signed)
  Echocardiogram 2D Echocardiogram has been performed.  Nicholas Knight 11/27/2018, 10:00 AM

## 2018-11-27 NOTE — Progress Notes (Signed)
ANTICOAGULATION CONSULT NOTE - Follow up [place] for heparin  Indication: chest pain/ACS  Allergies  Allergen Reactions  . Penicillins Other (See Comments)    Childhood; uknown reaction    Patient Measurements: Height: 5\' 8"  (172.7 cm) Weight: 208 lb 11.2 oz (94.7 kg) IBW/kg (Calculated) : 68.4 Heparin Dosing Weight: 87kg  Vital Signs: Temp: 98.2 F (36.8 C) (04/26 0825) Temp Source: Oral (04/26 0825) BP: 142/100 (04/26 0825) Pulse Rate: 98 (04/26 0825)  Labs: Recent Labs    11/26/18 1313  11/26/18 2042 11/27/18 0217 11/27/18 0716 11/27/18 0744  HGB 15.0  --   --  14.9  --   --   HCT 47.8  --   --  45.7  --   --   PLT 273  --   --  291  --   --   LABPROT  --   --  14.8  --   --   --   INR  --   --  1.2  --   --   --   HEPARINUNFRC  --   --   --  0.37 0.36  --   CREATININE 1.41*  --   --  1.34*  --   --   TROPONINI 0.05*   < > 0.05* 0.06*  --  0.06*   < > = values in this interval not displayed.    Estimated Creatinine Clearance: 74.4 mL/min (A) (by C-G formula based on SCr of 1.34 mg/dL (H)).   Medical History: Past Medical History:  Diagnosis Date  . Hypertension     Assessment: 48 yo male here with SOB abd new onset HF and possible ACS. Pharmacy consulted to dose heparin. No anticoagulants noted PTA.   CBC stable and wnl. No bleeding or infusion issues noted per RN. Heparin level remains at goal level   Goal of Therapy:  Heparin level 0.3-0.7 units/ml Monitor platelets by anticoagulation protocol: Yes   Plan:  -Continue heparin IV at 1200 units/hr. -Daily heparin level and CBC. -F/u plans for cardiac workup  Marguerite Olea, [facility] Clinical Pharmacist Phone [phone]  11/27/2018 9:04 AM

## 2018-11-27 NOTE — Progress Notes (Signed)
Progress Note  Patient Name: Nicholas Knight Date of Encounter: 11/27/2018  Primary Cardiologist: No primary care provider on file.   Subjective   Respiratory status is improved.  He continues to diurese and is net out approximately 700 cc.  Inpatient Medications    Scheduled Meds:  aspirin EC  81 mg Oral Daily   carvedilol  3.125 mg Oral BID WC   furosemide  40 mg Intravenous BID   lisinopril  5 mg Oral Daily   pneumococcal 23 valent vaccine  0.5 mL Intramuscular Tomorrow-1000   sodium chloride flush  3 mL Intravenous Q12H   Continuous Infusions:  sodium chloride     heparin 1,200 Units/hr (11/26/18 2129)   PRN Meds: sodium chloride, acetaminophen, ondansetron (ZOFRAN) IV, sodium chloride flush   Vital Signs    Vitals:   11/26/18 1956 11/27/18 0000 11/27/18 0609 11/27/18 0825  BP: (!) 160/90 (!) 147/118 (!) 141/115 (!) 142/100  Pulse: 98 (!) 104 (!) 103 98  Resp: 20 18 16 18   Temp: 98 F (36.7 C) 98 F (36.7 C) 98.7 F (37.1 C) 98.2 F (36.8 C)  TempSrc: Oral Oral Oral Oral  SpO2: 98% 98% 99% 100%  Weight: 95.6 kg  94.7 kg   Height: 5\' 8"  (1.727 m)       Intake/Output Summary (Last 24 hours) at 11/27/2018 1007 Last data filed at 11/27/2018 0814 Gross per 24 hour  Intake 1190.68 ml  Output 1900 ml  Net -709.32 ml   Last 3 Weights 11/27/2018 11/26/2018 11/26/2018  Weight (lbs) 208 lb 11.2 oz 210 lb 12.2 oz 200 lb  Weight (kg) 94.666 kg 95.6 kg 90.719 kg      Telemetry    Sinus tachycardia- Personally Reviewed  ECG    Sinus tachycardia- Personally Reviewed  Physical Exam   GEN: No acute distress.   Neck:  5 cm JVD when sitting up Cardiac:  Tachycardic, regular, no murmurs, rubs, or gallops.  Respiratory: Clear to auscultation bilaterally. GI: Soft, nontender, non-distended  MS: No edema; No deformity. Neuro:  Nonfocal  Psych: Normal affect   Labs    Chemistry Recent Labs  Lab 11/26/18 1313 11/27/18 0217  NA 139 141  K 4.5 4.0    CL 107 107  CO2 21* 24  GLUCOSE 138* 94  BUN 27* 24*  CREATININE 1.41* 1.34*  CALCIUM 8.8* 9.0  GFRNONAA 58* >60  GFRAA >60 >60  ANIONGAP 11 10     Hematology Recent Labs  Lab 11/26/18 1313 11/27/18 0217  WBC 6.2 7.2  RBC 5.68 5.61  HGB 15.0 14.9  HCT 47.8 45.7  MCV 84.2 81.5  MCH 26.4 26.6  MCHC 31.4 32.6  RDW 15.6* 15.4  PLT 273 291    Cardiac Enzymes Recent Labs  Lab 11/26/18 1737 11/26/18 2042 11/27/18 0217 11/27/18 0744  TROPONINI 0.05* 0.05* 0.06* 0.06*   No results for input(s): TROPIPOC in the last 168 hours.   BNP Recent Labs  Lab 11/26/18 1313 11/27/18 0217  BNP 778.4* 1,015.2*     DDimer  Recent Labs  Lab 11/26/18 1313  DDIMER 1.12*     Radiology    Dg Chest 2 View  Result Date: 11/26/2018 CLINICAL DATA:  Short of breath for 2 weeks EXAM: CHEST - 2 VIEW COMPARISON:  None. FINDINGS: Moderate cardiomegaly. Vascular congestion. No Kerley B lines. No pneumothorax or pleural effusion. Low lung volumes with vascular crowding. IMPRESSION: Cardiomegaly and vascular congestion consistent with mild CHF. No sign of overt pulmonary  edema. Electronically Signed   By: Marybelle Killings M.D.   On: 11/26/2018 14:19   Ct Angio Chest Pe W And/or Wo Contrast  Result Date: 11/26/2018 CLINICAL DATA:  Short of breath EXAM: CT ANGIOGRAPHY CHEST WITH CONTRAST TECHNIQUE: Multidetector CT imaging of the chest was performed using the standard protocol during bolus administration of intravenous contrast. Multiplanar CT image reconstructions and MIPs were obtained to evaluate the vascular anatomy. CONTRAST:  5mL OMNIPAQUE IOHEXOL 350 MG/ML SOLN COMPARISON:  None. FINDINGS: Cardiovascular: There are no filling defects in the pulmonary arterial tree to suggest acute pulmonary thromboembolism. The left ventricle is dilated. There is reflux of contrast into the hepatic veins. Mediastinum/Nodes: No abnormal mediastinal adenopathy. No pericardial effusion. Thyroid is unremarkable  esophagus is unremarkable. Lungs/Pleura: Small bilateral pleural effusions and associated dependent atelectasis. There is peribronchovascular soft tissue prominence in the hilar regions. Scattered interlobular septal thickening is noted. Upper Abdomen: No acute abnormality. Musculoskeletal: No vertebral compression deformity. Review of the MIP images confirms the above findings. IMPRESSION: No evidence of acute pulmonary thromboembolism. There is interlobular septal thickening which may represent interstitial edema associated with bilateral pleural effusions, left ventricular dilatation, and hilar soft tissue prominence. These findings can be associated with CHF. Electronically Signed   By: Marybelle Killings M.D.   On: 11/26/2018 16:53    Cardiac Studies   TTE pending  Patient Profile     48 y.o. male with a history of hypertension and diabetes who presented to the hospital with shortness of breath found to have an elevated BNP  Assessment & Plan     1.  New onset heart failure: Patient is improving with diuresis.  He has diuresed approximately 700 cc.  Would continue diuresis at this time.  His creatinine has continued to improve.  He has an echo pending for today.  We [name] make further recommendations with echo results.  2.  Acute renal failure: Likely due to heart failure and volume overload.  Improving with diuresis.  3.  Hypertension: Blood pressure is currently elevated.  [name] likely need an ACE/ARB versus beta-blocker pending echo.     For questions or updates, please contact [place] Please consult [email or web address] for contact info under        Signed, Damonie Ellenwood Meredith Leeds, MD  11/27/2018, 10:07 AM

## 2018-11-27 NOTE — Progress Notes (Signed)
ANTICOAGULATION CONSULT NOTE - Follow up [place] for heparin  Indication: chest pain/ACS  Allergies  Allergen Reactions  . Penicillins Other (See Comments)    Childhood; uknown reaction    Patient Measurements: Height: 5\' 8"  (172.7 cm) Weight: 210 lb 12.2 oz (95.6 kg) IBW/kg (Calculated) : 68.4 Heparin Dosing Weight: 87kg  Vital Signs: Temp: 98 F (36.7 C) (04/26 0000) Temp Source: Oral (04/26 0000) BP: 147/118 (04/26 0000) Pulse Rate: 104 (04/26 0000)  Labs: Recent Labs    11/26/18 1313 11/26/18 1737 11/26/18 2042 11/27/18 0217  HGB 15.0  --   --  14.9  HCT 47.8  --   --  45.7  PLT 273  --   --  291  LABPROT  --   --  14.8  --   INR  --   --  1.2  --   HEPARINUNFRC  --   --   --  0.37  CREATININE 1.41*  --   --   --   TROPONINI 0.05* 0.05* 0.05*  --     Estimated Creatinine Clearance: 71.1 mL/min (A) (by C-G formula based on SCr of 1.41 mg/dL (H)).   Medical History: Past Medical History:  Diagnosis Date  . Hypertension     Assessment: 48 yo male here with SOB abd new onset HF and possible ACS. Pharmacy consulted to dose heparin. No anticoagulants noted PTA.   CBC stable and wnl. No bleeding or infusion issues noted per RN. Heparin level therapeutic x 1 at 0.37    Goal of Therapy:  Heparin level 0.3-0.7 units/ml Monitor platelets by anticoagulation protocol: Yes   Plan:  -Continue heparin IV at 1200 units/hr (~ 14 units/kg/hr) -Check confirmatory heparin level in 6 hours and daily wth CBC daily  Vertis Kelch, PharmD PGY1 Pharmacy Resident Phone [phone] 11/27/2018       3:05 AM

## 2018-11-27 NOTE — Progress Notes (Signed)
Family Medicine Teaching Service Daily Progress Note Intern Pager: [phone]  Patient name: Nicholas Knight Medical record number: [id number] Date of birth: 08/01/1971 Age: 48 y.o. Gender: male  Primary Care Provider: Cleophas Dunker, DO Consultants: cardiology Code Status: full  Pt Overview and Major Events to Date:  4/25- admitted  Assessment and Plan: Nicholas Knight is a 48 y.o. male presenting with likely new onset heart failure with unknown exacerbation. PMH is significant for HTN, IDT2DM  ACS r/o vs new onset heart failure- troponins remained elevated overnight. 0.06. patient remained slightly tachycardic, tachypneic and hypertensive. Patient had almost 2L urine output yesterday and is net -930cc. He states he had frequent urination after his first dose of lasix. He is down 1kg since admission. BNP this morning increased to 1,015.2, TSH and PT/INR wnl. Patient slept well last night and did not hav PND. Consider starting spironolactone and statin today.  - continuous cardiac monitoring - f/u cards  recs - continue heparin drip - continue carvedilol - trend troponins - echo - repeat ECG am - coronary angiogram and/or cardiac MRI Monday per cards - diurese. 40mg  IV lasix BID   - K+ 4.0 today which is decreased from 4.5 on admission. Will continue to monitor with daily BMP. - ASA - incentive spirometry - CBC am - continue home lisinopril 5mg  daily for HTN - plan to initiate statin - cardiac rehab on discharge - vitals per floor protocol  - daily weights - monitor I/Os  Dyslipidemia- lipid panel this admission showed elevated LDL 100, HDL 37. - start statin therapy this admission  IDDMT2- Hgb A1c is 6.0 this admission. Glucose 94 this morning. Patient not requiring insulin. Will decrease CBG checks - hold metformin and gabapentin - check blood glucose daily with BMP - may add on basal after seeing what his sugars are running  AKI- Cr 1.41 on admission and GFR >60.  Cr improved to 1.34 today. Could consider holding home lisinopril but with good GFR and improving Cr in setting of diuresis, HF, HTN will continue it for now. - monitor with BMP am - avoid nephrotoxic agents.  FEN/GI: heart healthy, carb-modified diet, zofran PRN Prophylaxis: on heparin gtt   Disposition: admit to card tele  Subjective:  Patient in good spirits this morning. He denies chest pain or SOB. He slept well without reoccurrence of PND. He had good response to lasix. Questions about returning to work after cath tomorrow.  Objective: Temp:  [98 F (36.7 C)-98.7 F (37.1 C)] 98.7 F (37.1 C) (04/26 0609) Pulse Rate:  [91-111] 103 (04/26 0609) Resp:  [13-31] 16 (04/26 0609) BP: (141-165)/(90-134) 141/115 (04/26 0609) SpO2:  [93 %-100 %] 99 % (04/26 0609) Weight:  [90.7 kg-95.6 kg] 94.7 kg (04/26 0609) Physical Exam: General: NAD, sitting up in bed Cardiovascular: RRR, no murmur appreciated, no LE edema Respiratory: CTAB, no increased WOB Abdomen: soft, non-tender Neuro: alert and oriented, no focal deficits Derm: no rashes or lesions noted, not diaphoretic   Laboratory: Recent Labs  Lab 11/26/18 1313 11/27/18 0217  WBC 6.2 7.2  HGB 15.0 14.9  HCT 47.8 45.7  PLT 273 291   Recent Labs  Lab 11/26/18 1313 11/27/18 0217  NA 139 141  K 4.5 4.0  CL 107 107  CO2 21* 24  BUN 27* 24*  CREATININE 1.41* 1.34*  CALCIUM 8.8* 9.0  GLUCOSE 138* 94   Troponin 0.05>0.05>0.05>0.06  BNP 778.4>1,015.2  Imaging/Diagnostic Tests: Dg Chest 2 View  Result Date: 11/26/2018 CLINICAL DATA:  Short  of breath for 2 weeks EXAM: CHEST - 2 VIEW COMPARISON:  None. FINDINGS: Moderate cardiomegaly. Vascular congestion. No Kerley B lines. No pneumothorax or pleural effusion. Low lung volumes with vascular crowding. IMPRESSION: Cardiomegaly and vascular congestion consistent with mild CHF. No sign of overt pulmonary edema. Electronically Signed   By: Marybelle Killings M.D.   On: 11/26/2018  14:19   Ct Angio Chest Pe W And/or Wo Contrast  Result Date: 11/26/2018 CLINICAL DATA:  Short of breath EXAM: CT ANGIOGRAPHY CHEST WITH CONTRAST TECHNIQUE: Multidetector CT imaging of the chest was performed using the standard protocol during bolus administration of intravenous contrast. Multiplanar CT image reconstructions and MIPs were obtained to evaluate the vascular anatomy. CONTRAST:  70mL OMNIPAQUE IOHEXOL 350 MG/ML SOLN COMPARISON:  None. FINDINGS: Cardiovascular: There are no filling defects in the pulmonary arterial tree to suggest acute pulmonary thromboembolism. The left ventricle is dilated. There is reflux of contrast into the hepatic veins. Mediastinum/Nodes: No abnormal mediastinal adenopathy. No pericardial effusion. Thyroid is unremarkable esophagus is unremarkable. Lungs/Pleura: Small bilateral pleural effusions and associated dependent atelectasis. There is peribronchovascular soft tissue prominence in the hilar regions. Scattered interlobular septal thickening is noted. Upper Abdomen: No acute abnormality. Musculoskeletal: No vertebral compression deformity. Review of the MIP images confirms the above findings. IMPRESSION: No evidence of acute pulmonary thromboembolism. There is interlobular septal thickening which may represent interstitial edema associated with bilateral pleural effusions, left ventricular dilatation, and hilar soft tissue prominence. These findings can be associated with CHF. Electronically Signed   By: Marybelle Killings M.D.   On: 11/26/2018 16:53    Richarda Osmond, DO 11/27/2018, 7:29 AM PGY-1, [place] Intern pager: [phone], text pages welcome

## 2018-11-27 NOTE — Discharge Summary (Signed)
[facility] Discharge Summary  Patient name: Nicholas Knight Medical record number: [id number] Date of birth: [birth year] Age: 48 y.o. Gender: male Date of Admission: 11/26/2018  Date of Discharge: 11/29/2018 Admitting Physician: Martyn Malay, MD  Primary Care Provider: Cleophas Dunker, DO Consultants: cardiology  Indication for Hospitalization: new onset heart failure  Discharge Diagnoses/Problem List:  New onset HFrEF T2DM HTN AKI  Disposition: discharge home with cardiac rehab  Discharge Condition: improved  Discharge Exam:  General: NAD, pleasant, able to participate in exam Cardiac: RRR, normal heart sounds, no murmurs. 2+ radial and PT pulses bilaterally, mild JVD present Respiratory: CTAB, normal effort, No wheezes, rales or rhonchi Abdomen: soft, nontender, nondistended, no hepatic or splenomegaly, +BS Extremities: no edema or cyanosis. WWP. Skin: warm and dry, no rashes noted Neuro: alert and oriented x4, no focal deficits Psych: Normal affect and mood  Brief Hospital Course:  Previously healthy patient with h/o HTN and well controlled DM presented for cough, DOE and PND x1 month. He remained stable throughout admission with hypertension initially SBP>180. troponins were elevated (see lab values below) Echo showed diffuse LV hypokinesis with EF 10-15% Patient received a L/R heart cath which showed non-ischemic diffuse hypokinesis.  Patient was started on optimal medical management to include coreg, statin, spironolactone, ACEi, ASA, lasix. We discussed changing to entresto but patient will need a 36 hour washout of ACEi and a good confirmation that entresto will be affordable to patient.   Issues for Follow Up:  1. Will require cardiac rehab 2. Follow up with cardiology to follow up of LV function at 3 months (repeat echo). Also will need discussion on return to work as patient is an active duty Engineer, structural. 3. Lipid panel and  LFTs in 6-8 weeks 4. Recommend switching lisinopril to entresto if BP can tolerate and drug is affordable after a 36 hour washout.  Significant Procedures:  Echo L/R heartCath   Significant Labs and Imaging:  Recent Labs  Lab 11/27/18 0217 11/28/18 0538  11/28/18 1401 11/28/18 1409 11/29/18 0350  WBC 7.2 7.0  --   --   --  6.7  HGB 14.9 15.3   < > 16.0 15.6 15.5  HCT 45.7 47.1   < > 47.0 46.0 47.6  PLT 291 290  --   --   --  291   < > = values in this interval not displayed.   Recent Labs  Lab 11/26/18 1313 11/27/18 0217 11/28/18 0538 11/28/18 1400 11/28/18 1401 11/28/18 1409 11/29/18 0350  NA 139 141 143 140 142 141 140  K 4.5 4.0 4.0 3.7 3.6 3.6 4.0  CL 107 107 104  --   --   --  103  CO2 21* 24 27  --   --   --  29  GLUCOSE 138* 94 98  --   --   --  111*  BUN 27* 24* 21*  --   --   --  22*  CREATININE 1.41* 1.34* 1.49*  --   --   --  1.49*  CALCIUM 8.8* 9.0 9.2  --   --   --  9.2   Troponin .05>.05>.06   Dg Chest 2 View  Result Date: 11/26/2018 CLINICAL DATA:  Short of breath for 2 weeks EXAM: CHEST - 2 VIEW COMPARISON:  None. FINDINGS: Moderate cardiomegaly. Vascular congestion. No Kerley B lines. No pneumothorax or pleural effusion. Low lung volumes with vascular crowding. IMPRESSION: Cardiomegaly and vascular congestion consistent with  mild CHF. No sign of overt pulmonary edema. Electronically Signed   By: Marybelle Killings M.D.   On: 11/26/2018 14:19   Ct Angio Chest Pe W And/or Wo Contrast  Result Date: 11/26/2018 CLINICAL DATA:  Short of breath EXAM: CT ANGIOGRAPHY CHEST WITH CONTRAST TECHNIQUE: Multidetector CT imaging of the chest was performed using the standard protocol during bolus administration of intravenous contrast. Multiplanar CT image reconstructions and MIPs were obtained to evaluate the vascular anatomy. CONTRAST:  36m OMNIPAQUE IOHEXOL 350 MG/ML SOLN COMPARISON:  None. FINDINGS: Cardiovascular: There are no filling defects in the pulmonary arterial  tree to suggest acute pulmonary thromboembolism. The left ventricle is dilated. There is reflux of contrast into the hepatic veins. Mediastinum/Nodes: No abnormal mediastinal adenopathy. No pericardial effusion. Thyroid is unremarkable esophagus is unremarkable. Lungs/Pleura: Small bilateral pleural effusions and associated dependent atelectasis. There is peribronchovascular soft tissue prominence in the hilar regions. Scattered interlobular septal thickening is noted. Upper Abdomen: No acute abnormality. Musculoskeletal: No vertebral compression deformity. Review of the MIP images confirms the above findings. IMPRESSION: No evidence of acute pulmonary thromboembolism. There is interlobular septal thickening which may represent interstitial edema associated with bilateral pleural effusions, left ventricular dilatation, and hilar soft tissue prominence. These findings can be associated with CHF. Electronically Signed   By: AMarybelle KillingsM.D.   On: 11/26/2018 16:53     Results/Tests Pending at Time of Discharge:  none  Discharge Medications:  Allergies as of 11/29/2018      Reactions   Penicillins Other (See Comments)   Childhood; uknown reaction      Medication List    STOP taking these medications   blood glucose meter kit and supplies   gabapentin 100 MG capsule Commonly known as:  NEURONTIN   Insulin Pen Needle 31G X 5 MM Misc   Levemir FlexTouch 100 UNIT/ML Pen Generic drug:  Insulin Detemir     TAKE these medications   aspirin 81 MG EC tablet Take 1 tablet (81 mg total) by mouth daily.   atorvastatin 80 MG tablet Commonly known as:  LIPITOR Take 1 tablet (80 mg total) by mouth daily at 6 PM.   carvedilol 12.5 MG tablet Commonly known as:  COREG Take 1 tablet (12.5 mg total) by mouth 2 (two) times daily with a meal.   furosemide 20 MG tablet Commonly known as:  LASIX Take 1 tablet (20 mg total) by mouth daily.   lisinopril 5 MG tablet Commonly known as:  ZESTRIL TAKE 1  TABLET BY MOUTH EVERY DAY   metFORMIN 500 MG tablet Commonly known as:  GLUCOPHAGE TAKE 1 TABLET BY MOUTH TWICE A DAY WITH A MEAL   spironolactone 25 MG tablet Commonly known as:  ALDACTONE Take 0.5 tablets (12.5 mg total) by mouth daily.       Discharge Instructions: Please refer to Patient Instructions section of EMR for full details.  Patient was counseled important signs and symptoms that should prompt return to medical care, changes in medications, dietary instructions, activity restrictions, and follow up appointments.   Follow-Up Appointments: Follow-up Information    Meccariello, BBernita Raisin DO. Go on 12/06/2018.   Specialty:  Family Medicine Why:  '@13' :30pm Televisit,office will send link on cell phone Contact information: [phone]          ARicharda Osmond DO 11/30/2018, 11:54 AM PGY-1, [place]

## 2018-11-28 ENCOUNTER — Encounter (HOSPITAL_COMMUNITY)
Admission: EM | Disposition: A | Discharge status: Home / Self Care | Payer: Self-pay | Source: Home / Self Care | Attending: Family Medicine

## 2018-11-28 ENCOUNTER — Encounter (HOSPITAL_COMMUNITY): Payer: Self-pay | Admitting: Family Medicine

## 2018-11-28 DIAGNOSIS — I5023 Acute on chronic systolic (congestive) heart failure: Secondary | ICD-10-CM | POA: Diagnosis present

## 2018-11-28 DIAGNOSIS — I5030 Unspecified diastolic (congestive) heart failure: Secondary | ICD-10-CM | POA: Diagnosis present

## 2018-11-28 DIAGNOSIS — I5022 Chronic systolic (congestive) heart failure: Secondary | ICD-10-CM | POA: Diagnosis present

## 2018-11-28 HISTORY — PX: RIGHT/LEFT HEART CATH AND CORONARY ANGIOGRAPHY: CATH118266

## 2018-11-28 LAB — POCT I-STAT 7, (LYTES, BLD GAS, ICA,H+H)
Bicarbonate: 24.1 mmol/L (ref 20.0–28.0)
Calcium, Ion: 1.21 mmol/L (ref 1.15–1.40)
HCT: 46 % (ref 39.0–52.0)
Hemoglobin: 15.6 g/dL (ref 13.0–17.0)
O2 Saturation: 97 %
Potassium: 3.6 mmol/L (ref 3.5–5.1)
Sodium: 141 mmol/L (ref 135–145)
TCO2: 25 mmol/L (ref 22–32)
pCO2 arterial: 37.7 mmHg (ref 32.0–48.0)
pH, Arterial: 7.414 (ref 7.350–7.450)
pO2, Arterial: 92 mmHg (ref 83.0–108.0)

## 2018-11-28 LAB — BASIC METABOLIC PANEL
Anion gap: 12 (ref 5–15)
BUN: 21 mg/dL — ABNORMAL HIGH (ref 6–20)
CO2: 27 mmol/L (ref 22–32)
Calcium: 9.2 mg/dL (ref 8.9–10.3)
Chloride: 104 mmol/L (ref 98–111)
Creatinine, Ser: 1.49 mg/dL — ABNORMAL HIGH (ref 0.61–1.24)
GFR calc Af Amer: >60 mL/min (ref >60)
GFR calc non Af Amer: 54 mL/min — ABNORMAL LOW (ref >60)
Glucose, Bld: 98 mg/dL (ref 70–99)
Potassium: 4 mmol/L (ref 3.5–5.1)
Sodium: 143 mmol/L (ref 135–145)

## 2018-11-28 LAB — POCT I-STAT EG7
Acid-Base Excess: 1 mmol/L (ref 0.0–2.0)
Acid-Base Excess: 2 mmol/L (ref 0.0–2.0)
Bicarbonate: 25.9 mmol/L (ref 20.0–28.0)
Bicarbonate: 27 mmol/L (ref 20.0–28.0)
Calcium, Ion: 1.18 mmol/L (ref 1.15–1.40)
Calcium, Ion: 1.2 mmol/L (ref 1.15–1.40)
HCT: 47 % (ref 39.0–52.0)
HCT: 47 % (ref 39.0–52.0)
Hemoglobin: 16 g/dL (ref 13.0–17.0)
Hemoglobin: 16 g/dL (ref 13.0–17.0)
O2 Saturation: 68 %
O2 Saturation: 69 %
Potassium: 3.6 mmol/L (ref 3.5–5.1)
Potassium: 3.7 mmol/L (ref 3.5–5.1)
Sodium: 140 mmol/L (ref 135–145)
Sodium: 142 mmol/L (ref 135–145)
TCO2: 27 mmol/L (ref 22–32)
TCO2: 28 mmol/L (ref 22–32)
pCO2, Ven: 42.5 mmHg — ABNORMAL LOW (ref 44.0–60.0)
pCO2, Ven: 43.5 mmHg — ABNORMAL LOW (ref 44.0–60.0)
pH, Ven: 7.393 (ref 7.250–7.430)
pH, Ven: 7.401 (ref 7.250–7.430)
pO2, Ven: 36 mmHg (ref 32.0–45.0)
pO2, Ven: 36 mmHg (ref 32.0–45.0)

## 2018-11-28 LAB — CBC
HCT: 47.1 % (ref 39.0–52.0)
Hemoglobin: 15.3 g/dL (ref 13.0–17.0)
MCH: 26.4 pg (ref 26.0–34.0)
MCHC: 32.5 g/dL (ref 30.0–36.0)
MCV: 81.2 fL (ref 80.0–100.0)
Platelets: 290 10*3/uL (ref 150–400)
RBC: 5.8 MIL/uL (ref 4.22–5.81)
RDW: 15.4 % (ref 11.5–15.5)
WBC: 7 10*3/uL (ref 4.0–10.5)
nRBC: 0 % (ref 0.0–0.2)

## 2018-11-28 LAB — HEPARIN LEVEL (UNFRACTIONATED): Heparin Unfractionated: 0.37 IU/mL (ref 0.30–0.70)

## 2018-11-28 SURGERY — RIGHT/LEFT HEART CATH AND CORONARY ANGIOGRAPHY
Anesthesia: LOCAL

## 2018-11-28 MED ORDER — HEPARIN (PORCINE) IN NACL 1000-0.9 UT/500ML-% IV SOLN
INTRAVENOUS | Status: AC
  Filled 2018-11-28: qty 1000

## 2018-11-28 MED ORDER — SODIUM CHLORIDE 0.9 % IV SOLN: 250.0000 mL | INTRAVENOUS | Status: DC | PRN

## 2018-11-28 MED ORDER — LIDOCAINE HCL (PF) 1 % IJ SOLN
INTRAMUSCULAR | Status: DC | PRN
  Administered 2018-11-28: 5 mL

## 2018-11-28 MED ORDER — HEPARIN SODIUM (PORCINE) 1000 UNIT/ML IJ SOLN
INTRAMUSCULAR | Status: DC | PRN
  Administered 2018-11-28: 4500 [IU] via INTRAVENOUS

## 2018-11-28 MED ORDER — SODIUM CHLORIDE 0.9% FLUSH
3.0000 mL | Freq: Two times a day (BID) | INTRAVENOUS | Status: DC
  Administered 2018-11-29: 3 mL via INTRAVENOUS

## 2018-11-28 MED ORDER — ACETAMINOPHEN 325 MG PO TABS: 650.0000 mg | ORAL_TABLET | ORAL | Status: DC | PRN

## 2018-11-28 MED ORDER — SPIRONOLACTONE 12.5 MG HALF TABLET
12.5000 mg | ORAL_TABLET | Freq: Every day | ORAL | Status: DC
  Administered 2018-11-28 – 2018-11-29 (×2): 12.5 mg via ORAL
  Filled 2018-11-28 (×2): qty 1

## 2018-11-28 MED ORDER — SODIUM CHLORIDE 0.9% FLUSH: 3.0000 mL | INTRAVENOUS | Status: DC | PRN

## 2018-11-28 MED ORDER — ATORVASTATIN CALCIUM 80 MG PO TABS
80.0000 mg | ORAL_TABLET | Freq: Every day | ORAL | Status: DC
  Administered 2018-11-28: 80 mg via ORAL
  Filled 2018-11-28: qty 1

## 2018-11-28 MED ORDER — CARVEDILOL 6.25 MG PO TABS
6.2500 mg | ORAL_TABLET | Freq: Two times a day (BID) | ORAL | Status: DC
  Administered 2018-11-28 – 2018-11-29 (×2): 6.25 mg via ORAL
  Filled 2018-11-28 (×2): qty 1

## 2018-11-28 MED ORDER — SODIUM CHLORIDE 0.9 % IV SOLN: INTRAVENOUS | Status: AC

## 2018-11-28 MED ORDER — ASPIRIN 81 MG PO CHEW: 81.0000 mg | CHEWABLE_TABLET | Freq: Every day | ORAL | Status: DC

## 2018-11-28 MED ORDER — MORPHINE SULFATE (PF) 2 MG/ML IV SOLN: 2.0000 mg | INTRAVENOUS | Status: DC | PRN

## 2018-11-28 MED ORDER — IOHEXOL 350 MG/ML SOLN
INTRAVENOUS | Status: DC | PRN
  Administered 2018-11-28: 30 mL via INTRACARDIAC

## 2018-11-28 MED ORDER — VERAPAMIL HCL 2.5 MG/ML IV SOLN
INTRA_ARTERIAL | Status: DC | PRN
  Administered 2018-11-28: 14:00:00 5 mL via INTRA_ARTERIAL

## 2018-11-28 MED ORDER — SODIUM CHLORIDE 0.9 % IV SOLN: INTRAVENOUS | Status: DC

## 2018-11-28 MED ORDER — LABETALOL HCL 5 MG/ML IV SOLN
10.0000 mg | INTRAVENOUS | Status: AC | PRN
  Filled 2018-11-28: qty 4

## 2018-11-28 MED ORDER — ASPIRIN 81 MG PO CHEW: 81.0000 mg | CHEWABLE_TABLET | ORAL | Status: DC

## 2018-11-28 MED ORDER — VERAPAMIL HCL 2.5 MG/ML IV SOLN
INTRAVENOUS | Status: AC
  Filled 2018-11-28: qty 2

## 2018-11-28 MED ORDER — HEPARIN SODIUM (PORCINE) 1000 UNIT/ML IJ SOLN
INTRAMUSCULAR | Status: AC
  Filled 2018-11-28: qty 1

## 2018-11-28 MED ORDER — LIDOCAINE HCL (PF) 1 % IJ SOLN
INTRAMUSCULAR | Status: AC
  Filled 2018-11-28: qty 30

## 2018-11-28 MED ORDER — NITROGLYCERIN 1 MG/10 ML FOR IR/CATH LAB
INTRA_ARTERIAL | Status: AC
  Filled 2018-11-28: qty 10

## 2018-11-28 MED ORDER — HYDRALAZINE HCL 20 MG/ML IJ SOLN: 10.0000 mg | INTRAMUSCULAR | Status: AC | PRN

## 2018-11-28 MED ORDER — ONDANSETRON HCL 4 MG/2ML IJ SOLN: 4.0000 mg | Freq: Four times a day (QID) | INTRAMUSCULAR | Status: DC | PRN

## 2018-11-28 MED ORDER — SODIUM CHLORIDE 0.9% FLUSH: 3.0000 mL | Freq: Two times a day (BID) | INTRAVENOUS | Status: DC

## 2018-11-28 MED ORDER — HEPARIN (PORCINE) IN NACL 1000-0.9 UT/500ML-% IV SOLN
INTRAVENOUS | Status: DC | PRN
  Administered 2018-11-28 (×2): 500 mL

## 2018-11-28 SURGICAL SUPPLY — 14 items
CATH BALLN WEDGE 5F 110CM (CATHETERS) ×1 IMPLANT
CATH INFINITI 5FR ANG PIGTAIL (CATHETERS) ×1 IMPLANT
CATH OPTITORQUE TIG 4.0 5F (CATHETERS) ×1 IMPLANT
DEVICE RAD COMP TR BAND LRG (VASCULAR PRODUCTS) ×1 IMPLANT
ELECT DEFIB PAD ADLT CADENCE (PAD) ×1 IMPLANT
GLIDESHEATH SLEND A-KIT 6F 22G (SHEATH) ×1 IMPLANT
GUIDEWIRE INQWIRE 1.5J.035X260 (WIRE) IMPLANT
INQWIRE 1.5J .035X260CM (WIRE) ×2
KIT HEART LEFT (KITS) ×2 IMPLANT
PACK CARDIAC CATHETERIZATION (CUSTOM PROCEDURE TRAY) ×2 IMPLANT
SHEATH GLIDE SLENDER 4/5FR (SHEATH) ×1 IMPLANT
TRANSDUCER W/STOPCOCK (MISCELLANEOUS) ×2 IMPLANT
TUBING CIL FLEX 10 FLL-RA (TUBING) ×2 IMPLANT
WIRE HI TORQ VERSACORE-J 145CM (WIRE) ×1 IMPLANT

## 2018-11-28 NOTE — Plan of Care (Signed)
  Problem: Activity: Goal: Risk for activity intolerance will decrease Outcome: Progressing   Problem: Elimination: Goal: Will not experience complications related to bowel motility Outcome: Progressing   Problem: Coping: Goal: Level of anxiety will decrease Outcome: Completed/Met

## 2018-11-28 NOTE — TOC Initial Note (Signed)
Transition of Care [facility]) - Initial/Assessment Note    Patient Details  Name: Jia Mohamed MRN: [id number] Date of Birth: 12-24-1970  Transition of [facility]) CM/SW Contact:    Sherrilyn Rist Phone Number: [phone] 11/28/2018, 8:36 AM  Clinical Narrative:                 Patient is independent prior to admission, continues to work; Primary Care Provider: Cleophas Dunker, DO; has private insurance with [facility] [employer]; CM will continue to follow for progression of care.  Expected Discharge Plan: Home/Self Care Barriers to Discharge: No Barriers Identified   Patient Goals and CMS Choice        Expected Discharge Plan and Services Expected Discharge Plan: Home/Self Care   Discharge Planning Services: CM Consult   Living arrangements for the past 2 months: Single Family Home Expected Discharge Date: 11/28/18               DME Arranged: N/A DME Agency: NA       HH Arranged: NA [place] Agency: NA        Prior Living Arrangements/Services Living arrangements for the past 2 months: Single Family Home            Need for Family Participation in Patient Care: No (Comment) Care giver support system in place?: Yes (comment)   Criminal Activity/Legal Involvement Pertinent to Current Situation/Hospitalization: No - Comment as needed  Activities of Daily Living Home Assistive Devices/Equipment: None ADL Screening (condition at time of admission) Patient's cognitive ability adequate to safely complete daily activities?: Yes Is the patient deaf or have difficulty hearing?: No Does the patient have difficulty seeing, even when wearing glasses/contacts?: No Does the patient have difficulty concentrating, remembering, or making decisions?: No Patient able to express need for assistance with ADLs?: Yes Does the patient have difficulty dressing or bathing?: No Independently performs ADLs?: Yes (appropriate for developmental age) Does the patient have  difficulty walking or climbing stairs?: No Weakness of Legs: None Weakness of Arms/Hands: None  Permission Sought/Granted Permission sought to share information with : Case Manager                Emotional Assessment              Admission diagnosis:  Exertional dyspnea [R06.09] New onset of congestive heart failure ([place]) [I50.9] Patient Active Problem List   Diagnosis Date Noted  . Acute heart failure ([place]) 11/26/2018  . Exertional dyspnea   . New onset of congestive heart failure ([place])   . Sciatica of right side 02/09/2018  . Blurred vision 09/13/2015  . Diabetes ([place]) 09/12/2015  . Hypertension 07/07/2012   PCP:  Cleophas Dunker, DO Pharmacy:   [facility], [place] [address] Phone: [phone] Fax: [phone]  [id number] - [place], [place] - [address] [facility] [place] [address] [place] [address] Phone: [phone] Fax: [phone]     Social Determinants of Health (SDOH) Interventions    Readmission Risk Interventions No flowsheet data found.

## 2018-11-28 NOTE — Progress Notes (Signed)
Progress Note  Patient Name: Nicholas Knight Date of Encounter: 11/28/2018  Primary Cardiologist: New   Subjective   Feels much better, breathing much better.  No chest pain.  Has not eaten since yesterday afternoon.  Inpatient Medications    Scheduled Meds: . aspirin EC  81 mg Oral Daily  . furosemide  40 mg Intravenous BID  . lisinopril  5 mg Oral Daily  . sodium chloride flush  3 mL Intravenous Q12H   Continuous Infusions: . sodium chloride    . heparin 1,200 Units/hr (11/27/18 1437)   PRN Meds: sodium chloride, acetaminophen, guaiFENesin-dextromethorphan, ondansetron (ZOFRAN) IV, sodium chloride flush   Vital Signs    Vitals:   11/28/18 0548 11/28/18 0552 11/28/18 0748 11/28/18 0827  BP:  (!) 150/119 (!) 154/120 (!) 150/124  Pulse:  100 (!) 103 96  Resp:  16 17   Temp:  98.1 F (36.7 C) 98.7 F (37.1 C)   TempSrc:  Oral Oral   SpO2:  99% 98%   Weight: 91.8 kg     Height:        Intake/Output Summary (Last 24 hours) at 11/28/2018 0923 Last data filed at 11/28/2018 0811 Gross per 24 hour  Intake 862.82 ml  Output 1251 ml  Net -388.18 ml   Filed Weights   11/26/18 1956 11/27/18 0609 11/28/18 0548  Weight: 95.6 kg 94.7 kg 91.8 kg   Physical Exam   GEN: Well nourished, well developed, in no acute distress.  HEENT: Grossly normal.  Neck: Supple, no JVD, carotid bruits, or masses. Cardiac: RRR, no murmurs, rubs, +S3 gallop. No clubbing, cyanosis, edema.  Radials/DP/PT 2+ and equal bilaterally.  Respiratory:  Respirations regular and unlabored, clear to auscultation bilaterally. GI: Soft, nontender, nondistended, BS + x 4. MS: no deformity or atrophy. Skin: warm and dry, no rash. Neuro:  Strength and sensation are intact. Psych: AAOx3.  Normal affect.  Labs    Chemistry Recent Labs  Lab 11/26/18 1313 11/27/18 0217 11/28/18 0538  NA 139 141 143  K 4.5 4.0 4.0  CL 107 107 104  CO2 21* 24 27  GLUCOSE 138* 94 98  BUN 27* 24* 21*  CREATININE  1.41* 1.34* 1.49*  CALCIUM 8.8* 9.0 9.2  GFRNONAA 58* >60 54*  GFRAA >60 >60 >60  ANIONGAP 11 10 12      Hematology Recent Labs  Lab 11/26/18 1313 11/27/18 0217 11/28/18 0538  WBC 6.2 7.2 7.0  RBC 5.68 5.61 5.80  HGB 15.0 14.9 15.3  HCT 47.8 45.7 47.1  MCV 84.2 81.5 81.2  MCH 26.4 26.6 26.4  MCHC 31.4 32.6 32.5  RDW 15.6* 15.4 15.4  PLT 273 291 290    Cardiac Enzymes Recent Labs  Lab 11/26/18 1737 11/26/18 2042 11/27/18 0217 11/27/18 0744  TROPONINI 0.05* 0.05* 0.06* 0.06*   No results for input(s): TROPIPOC in the last 168 hours.   BNP Recent Labs  Lab 11/26/18 1313 11/27/18 0217  BNP 778.4* 1,015.2*     DDimer  Recent Labs  Lab 11/26/18 1313  DDIMER 1.12*     Radiology    Dg Chest 2 View  Result Date: 11/26/2018 CLINICAL DATA:  Short of breath for 2 weeks EXAM: CHEST - 2 VIEW COMPARISON:  None. FINDINGS: Moderate cardiomegaly. Vascular congestion. No Kerley B lines. No pneumothorax or pleural effusion. Low lung volumes with vascular crowding. IMPRESSION: Cardiomegaly and vascular congestion consistent with mild CHF. No sign of overt pulmonary edema. Electronically Signed   By: Arnell Sieving  Hoss M.D.   On: 11/26/2018 14:19   Ct Angio Chest Pe W And/or Wo Contrast  Result Date: 11/26/2018 CLINICAL DATA:  Short of breath EXAM: CT ANGIOGRAPHY CHEST WITH CONTRAST TECHNIQUE: Multidetector CT imaging of the chest was performed using the standard protocol during bolus administration of intravenous contrast. Multiplanar CT image reconstructions and MIPs were obtained to evaluate the vascular anatomy. CONTRAST:  67mL OMNIPAQUE IOHEXOL 350 MG/ML SOLN COMPARISON:  None. FINDINGS: Cardiovascular: There are no filling defects in the pulmonary arterial tree to suggest acute pulmonary thromboembolism. The left ventricle is dilated. There is reflux of contrast into the hepatic veins. Mediastinum/Nodes: No abnormal mediastinal adenopathy. No pericardial effusion. Thyroid is  unremarkable esophagus is unremarkable. Lungs/Pleura: Small bilateral pleural effusions and associated dependent atelectasis. There is peribronchovascular soft tissue prominence in the hilar regions. Scattered interlobular septal thickening is noted. Upper Abdomen: No acute abnormality. Musculoskeletal: No vertebral compression deformity. Review of the MIP images confirms the above findings. IMPRESSION: No evidence of acute pulmonary thromboembolism. There is interlobular septal thickening which may represent interstitial edema associated with bilateral pleural effusions, left ventricular dilatation, and hilar soft tissue prominence. These findings can be associated with CHF. Electronically Signed   By: Marybelle Killings M.D.   On: 11/26/2018 16:53   Telemetry    Sinus rhythm- Personally Reviewed  ECG    11/28/2018 NSR with evidence of LVH- Personally Reviewed  Cardiac Studies   Echocardiogram 11/27/2018: 1. The left ventricle has a visually estimated ejection fraction of 10-15%. The cavity size was moderately dilated. Left ventricular diastolic Doppler parameters are consistent with restrictive filling. Elevated left ventricular end-diastolic pressure  The E/e' is 21. Left ventricular diffuse hypokinesis.  2. The right ventricle has moderately reduced systolic function. The cavity was mildly enlarged. There is no increase in right ventricular wall thickness. Right ventricular systolic pressure is moderately elevated with an estimated pressure of 43.1  mmHg.  3. Left atrial size was severely dilated.  4. Trivial pericardial effusion is posterior to the left ventricle and localized near the right atrium.  5. The inferior vena cava was dilated in size with <50% respiratory variability.  Patient Profile     48 y.o. male with a history of hypertension and diabetes who presented to the hospital with shortness of breath found to have an elevated BNP  Assessment & Plan    1.  New severely reduced LV  function 50-27%/ systolic CHF:  -Unfortunately echocardiogram from 11/27/2018 showed LVEF of 10 to 15% with diffuse hypokinesis -Troponin, 0.05, 0.05, 0.06, 0.06>> flat trend -I&O, net -1 L since hospital admission -Weight, 202lb today, 200lb on hospital admission 11/26/2018 -Creatinine, 1.49 today which appears to be at his baseline per chart review -Continue Hep gtt per pharmacy for presumed ACS -Continue ASA 81, IV Lasix 40 mg twice daily, lisinopril 5 mg -Statin initiated per IM as well as spironolactone and beta-blocker>>would recommend spironolactone 12.5 mg and carvedilol 6.25 mg twice daily. May need hydralazine  -Needs better BP control -Will need further ischemic evaluation to evaluate possible coronary etiology given significant risk factors with hypertension and DM 2 with cardiac catheterization>> final recs per rounding MD to follow -Consider transition from lisinopril to [facility] given severely reduced LV function when renal function stablizes  -Will need LifeVest at hospital discharge with close follow up of LV function in 3 months   2.  Acute on chronic kidney disease stage II:  -Creatinine, 1.49 today which appears to be at his baseline -Continue to avoid  nephrotoxic medications  3.  Uncontrolled Hypertension: -Elevated, 150/124, 154/120, 150/119 -Spironolactone and carvedilol added to regimen -Likely chronic hypertension given significant LVH on EKG and echocardiogram   4.  Hyperlipidemia: -LDL, 100 -Continue high intensity statin -Will need lipid panel, LFTs in 6 to 8 weeks   Signed, Kathyrn Drown NP-C [facility] Pager: [phone] 11/28/2018, 9:23 AM     For questions or updates, please contact   Please consult [email or web address] for contact info under Cardiology/STEMI.  Personally seen and examined. Agree with above.   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 Marked hypertension.  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  We will go ahead and try to proceed  with cardiac catheterization.  He is n.p.o.  I will contact the catheterization lab.  Candee Furbish, MD

## 2018-11-28 NOTE — Interval H&P Note (Signed)
Cath Lab Visit (complete for each Cath Lab visit)  Clinical Evaluation Leading to the Procedure:   ACS: No.  Non-ACS:    Anginal Classification: CCS II  Anti-ischemic medical therapy: No Therapy  Non-Invasive Test Results: No non-invasive testing performed  Prior CABG: No previous CABG      History and Physical Interval Note:  11/28/2018 1:26 PM  Nicholas Knight  has presented today for surgery, with the diagnosis of congestive heart failure.  The various methods of treatment have been discussed with the patient and family. After consideration of risks, benefits and other options for treatment, the patient has consented to  Procedure(s): RIGHT/LEFT HEART CATH AND CORONARY ANGIOGRAPHY (N/A) as a surgical intervention.  The patient's history has been reviewed, patient examined, no change in status, stable for surgery.  I have reviewed the patient's chart and labs.  Questions were answered to the patient's satisfaction.     Quay Burow

## 2018-11-28 NOTE — H&P (View-Only) (Signed)
Progress Note  Patient Name: Kourtney Montesinos Date of Encounter: 11/28/2018  Primary Cardiologist: New   Subjective   Feels much better, breathing much better.  No chest pain.  Has not eaten since yesterday afternoon.  Inpatient Medications    Scheduled Meds: . aspirin EC  81 mg Oral Daily  . furosemide  40 mg Intravenous BID  . lisinopril  5 mg Oral Daily  . sodium chloride flush  3 mL Intravenous Q12H   Continuous Infusions: . sodium chloride    . heparin 1,200 Units/hr (11/27/18 1437)   PRN Meds: sodium chloride, acetaminophen, guaiFENesin-dextromethorphan, ondansetron (ZOFRAN) IV, sodium chloride flush   Vital Signs    Vitals:   11/28/18 0548 11/28/18 0552 11/28/18 0748 11/28/18 0827  BP:  (!) 150/119 (!) 154/120 (!) 150/124  Pulse:  100 (!) 103 96  Resp:  16 17   Temp:  98.1 F (36.7 C) 98.7 F (37.1 C)   TempSrc:  Oral Oral   SpO2:  99% 98%   Weight: 91.8 kg     Height:        Intake/Output Summary (Last 24 hours) at 11/28/2018 0923 Last data filed at 11/28/2018 0811 Gross per 24 hour  Intake 862.82 ml  Output 1251 ml  Net -388.18 ml   Filed Weights   11/26/18 1956 11/27/18 0609 11/28/18 0548  Weight: 95.6 kg 94.7 kg 91.8 kg   Physical Exam   GEN: Well nourished, well developed, in no acute distress.  HEENT: Grossly normal.  Neck: Supple, no JVD, carotid bruits, or masses. Cardiac: RRR, no murmurs, rubs, +S3 gallop. No clubbing, cyanosis, edema.  Radials/DP/PT 2+ and equal bilaterally.  Respiratory:  Respirations regular and unlabored, clear to auscultation bilaterally. GI: Soft, nontender, nondistended, BS + x 4. MS: no deformity or atrophy. Skin: warm and dry, no rash. Neuro:  Strength and sensation are intact. Psych: AAOx3.  Normal affect.  Labs    Chemistry Recent Labs  Lab 11/26/18 1313 11/27/18 0217 11/28/18 0538  NA 139 141 143  K 4.5 4.0 4.0  CL 107 107 104  CO2 21* 24 27  GLUCOSE 138* 94 98  BUN 27* 24* 21*  CREATININE  1.41* 1.34* 1.49*  CALCIUM 8.8* 9.0 9.2  GFRNONAA 58* >60 54*  GFRAA >60 >60 >60  ANIONGAP 11 10 12      Hematology Recent Labs  Lab 11/26/18 1313 11/27/18 0217 11/28/18 0538  WBC 6.2 7.2 7.0  RBC 5.68 5.61 5.80  HGB 15.0 14.9 15.3  HCT 47.8 45.7 47.1  MCV 84.2 81.5 81.2  MCH 26.4 26.6 26.4  MCHC 31.4 32.6 32.5  RDW 15.6* 15.4 15.4  PLT 273 291 290    Cardiac Enzymes Recent Labs  Lab 11/26/18 1737 11/26/18 2042 11/27/18 0217 11/27/18 0744  TROPONINI 0.05* 0.05* 0.06* 0.06*   No results for input(s): TROPIPOC in the last 168 hours.   BNP Recent Labs  Lab 11/26/18 1313 11/27/18 0217  BNP 778.4* 1,015.2*     DDimer  Recent Labs  Lab 11/26/18 1313  DDIMER 1.12*     Radiology    Dg Chest 2 View  Result Date: 11/26/2018 CLINICAL DATA:  Short of breath for 2 weeks EXAM: CHEST - 2 VIEW COMPARISON:  None. FINDINGS: Moderate cardiomegaly. Vascular congestion. No Kerley B lines. No pneumothorax or pleural effusion. Low lung volumes with vascular crowding. IMPRESSION: Cardiomegaly and vascular congestion consistent with mild CHF. No sign of overt pulmonary edema. Electronically Signed   By: Arnell Sieving  Hoss M.D.   On: 11/26/2018 14:19   Ct Angio Chest Pe W And/or Wo Contrast  Result Date: 11/26/2018 CLINICAL DATA:  Short of breath EXAM: CT ANGIOGRAPHY CHEST WITH CONTRAST TECHNIQUE: Multidetector CT imaging of the chest was performed using the standard protocol during bolus administration of intravenous contrast. Multiplanar CT image reconstructions and MIPs were obtained to evaluate the vascular anatomy. CONTRAST:  5mL OMNIPAQUE IOHEXOL 350 MG/ML SOLN COMPARISON:  None. FINDINGS: Cardiovascular: There are no filling defects in the pulmonary arterial tree to suggest acute pulmonary thromboembolism. The left ventricle is dilated. There is reflux of contrast into the hepatic veins. Mediastinum/Nodes: No abnormal mediastinal adenopathy. No pericardial effusion. Thyroid is  unremarkable esophagus is unremarkable. Lungs/Pleura: Small bilateral pleural effusions and associated dependent atelectasis. There is peribronchovascular soft tissue prominence in the hilar regions. Scattered interlobular septal thickening is noted. Upper Abdomen: No acute abnormality. Musculoskeletal: No vertebral compression deformity. Review of the MIP images confirms the above findings. IMPRESSION: No evidence of acute pulmonary thromboembolism. There is interlobular septal thickening which may represent interstitial edema associated with bilateral pleural effusions, left ventricular dilatation, and hilar soft tissue prominence. These findings can be associated with CHF. Electronically Signed   By: Marybelle Killings M.D.   On: 11/26/2018 16:53   Telemetry    Sinus rhythm- Personally Reviewed  ECG    11/28/2018 NSR with evidence of LVH- Personally Reviewed  Cardiac Studies   Echocardiogram 11/27/2018: 1. The left ventricle has a visually estimated ejection fraction of 10-15%. The cavity size was moderately dilated. Left ventricular diastolic Doppler parameters are consistent with restrictive filling. Elevated left ventricular end-diastolic pressure  The E/e' is 21. Left ventricular diffuse hypokinesis.  2. The right ventricle has moderately reduced systolic function. The cavity was mildly enlarged. There is no increase in right ventricular wall thickness. Right ventricular systolic pressure is moderately elevated with an estimated pressure of 43.1  mmHg.  3. Left atrial size was severely dilated.  4. Trivial pericardial effusion is posterior to the left ventricle and localized near the right atrium.  5. The inferior vena cava was dilated in size with <50% respiratory variability.  Patient Profile     48 y.o. male with a history of hypertension and diabetes who presented to the hospital with shortness of breath found to have an elevated BNP  Assessment & Plan    1.  New severely reduced LV  function 19-62%/ systolic CHF:  -Unfortunately echocardiogram from 11/27/2018 showed LVEF of 10 to 15% with diffuse hypokinesis -Troponin, 0.05, 0.05, 0.06, 0.06>> flat trend -I&O, net -1 L since hospital admission -Weight, 202lb today, 200lb on hospital admission 11/26/2018 -Creatinine, 1.49 today which appears to be at his baseline per chart review -Continue Hep gtt per pharmacy for presumed ACS -Continue ASA 81, IV Lasix 40 mg twice daily, lisinopril 5 mg -Statin initiated per IM as well as spironolactone and beta-blocker>>would recommend spironolactone 12.5 mg and carvedilol 6.25 mg twice daily. May need hydralazine  -Needs better BP control -Will need further ischemic evaluation to evaluate possible coronary etiology given significant risk factors with hypertension and DM 2 with cardiac catheterization>> final recs per rounding MD to follow -Consider transition from lisinopril to [facility] given severely reduced LV function when renal function stablizes  -Will need LifeVest at hospital discharge with close follow up of LV function in 3 months   2.  Acute on chronic kidney disease stage II:  -Creatinine, 1.49 today which appears to be at his baseline -Continue to avoid  nephrotoxic medications  3.  Uncontrolled Hypertension: -Elevated, 150/124, 154/120, 150/119 -Spironolactone and carvedilol added to regimen -Likely chronic hypertension given significant LVH on EKG and echocardiogram   4.  Hyperlipidemia: -LDL, 100 -Continue high intensity statin -Will need lipid panel, LFTs in 6 to 8 weeks   Signed, Kathyrn Drown NP-C [facility] Pager: [phone] 11/28/2018, 9:23 AM     For questions or updates, please contact   Please consult [email or web address] for contact info under Cardiology/STEMI.  Personally seen and examined. Agree with above.   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 Marked hypertension.  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  We will go ahead and try to proceed  with cardiac catheterization.  He is n.p.o.  I will contact the catheterization lab.  Candee Furbish, MD

## 2018-11-28 NOTE — Progress Notes (Signed)
ANTICOAGULATION CONSULT NOTE - Follow up [facility] for heparin  Indication: chest pain/ACS  Allergies  Allergen Reactions  . Penicillins Other (See Comments)    Childhood; uknown reaction    Patient Measurements: Height: 5\' 8"  (172.7 cm) Weight: 202 lb 4.8 oz (91.8 kg) IBW/kg (Calculated) : 68.4 Heparin Dosing Weight: 87kg  Vital Signs: Temp: 98.7 F (37.1 C) (04/27 0748) Temp Source: Oral (04/27 0748) BP: 154/120 (04/27 0748) Pulse Rate: 103 (04/27 0748)  Labs: Recent Labs    11/26/18 1313  11/26/18 2042 11/27/18 0217 11/27/18 0716 11/27/18 0744 11/28/18 0538  HGB 15.0  --   --  14.9  --   --  15.3  HCT 47.8  --   --  45.7  --   --  47.1  PLT 273  --   --  291  --   --  290  LABPROT  --   --  14.8  --   --   --   --   INR  --   --  1.2  --   --   --   --   HEPARINUNFRC  --   --   --  0.37 0.36  --  0.37  CREATININE 1.41*  --   --  1.34*  --   --  1.49*  TROPONINI 0.05*   < > 0.05* 0.06*  --  0.06*  --    < > = values in this interval not displayed.    Estimated Creatinine Clearance: 66 mL/min (A) (by C-G formula based on SCr of 1.49 mg/dL (H)).   Medical History: Past Medical History:  Diagnosis Date  . Hypertension     Assessment: 48 yo male here with SOB abd new onset HF and possible ACS. Pharmacy consulted to dose heparin. No anticoagulants noted PTA.   4/27 AM: Heparin level remains at goal on 1200 units/hr. CBC stable and wnl. No bleeding noted.   Goal of Therapy:  Heparin level 0.3-0.7 units/ml Monitor platelets by anticoagulation protocol: Yes   Plan:  -Continue heparin IV at 1200 units/hr. -Daily heparin level and CBC. -F/u plans for cardiac workup  Janae Bridgeman, PharmD PGY1 Pharmacy Resident Phone: [phone] 11/28/2018 8:22 AM

## 2018-11-28 NOTE — Progress Notes (Signed)
Family Medicine Teaching Service Daily Progress Note Intern Pager: [phone]  Patient name: Nicholas Knight Medical record number: [id number] Date of birth: 02-04-1971 Age: 48 y.o. Gender: male  Primary Care Provider: Cleophas Dunker, DO Consultants: cardiology Code Status: full  Pt Overview and Major Events to Date:  4/25- admitted  Assessment and Plan: Nicholas Knight is a 48 y.o. male presenting with likely new onset heart failure with unknown exacerbation. PMH is significant for HTN, IDT2DM  ACS r/o, new onset heart failure- troponins remained elevated overnight. 0.06. patient remained slightly tachycardic and hypertensive. Patient had 1.25L urine output yesterday and is net -1,272cc.  He is down to 202.3lbs today. Patient slept well last night and did not have PND. Patient made aware of his echo findings and he was very calm and accepting of results. We also discussed the cath procedure that he is scheduled for today.  - continuous cardiac monitoring - f/u cards  recs - continue heparin drip - repeat ECG am - diurese. 40mg  IV lasix BID   - K+ stable. 4.0 today which is decreased from 4.5 on admission. Will continue to monitor with daily BMP. - ASA - incentive spirometry - CBC am - continue home lisinopril 5mg  daily for HTN - plan to initiate statin, spironolactone, and BB - cardiac rehab on discharge - vitals per floor protocol  - daily weights - monitor I/Os  Dyslipidemia- lipid panel this admission showed elevated LDL 100, HDL 37. - start statin therapy this admission  IDDMT2- Hgb A1c is 6.0 this admission. Glucose 98 this morning. Patient not requiring insulin. - hold metformin and gabapentin - check blood glucose daily with BMP  AKI- Cr 1.41 on admission and GFR >60. Cr increased from 1.34 to 1.49 today. Consider holding lisinopril. - monitor with BMP am - avoid nephrotoxic agents.  FEN/GI: heart healthy, carb-modified diet, zofran PRN Prophylaxis: on  heparin gtt   Disposition: admit to card tele  Subjective:  Patient awake and sitting up this morning. He states he had a good night sleep with no chest pain or dyspnea. We discussed his echo results and some basic information for his cath today. He has no questions or concerns at this time.  Objective: Temp:  [97.5 F (36.4 C)-98.4 F (36.9 C)] 98.1 F (36.7 C) (04/27 0552) Pulse Rate:  [91-103] 100 (04/27 0552) Resp:  [16-18] 16 (04/27 0552) BP: (130-150)/(90-119) 150/119 (04/27 0552) SpO2:  [95 %-100 %] 99 % (04/27 0552) Weight:  [91.8 kg] 91.8 kg (04/27 0548)   Physical Exam: General: NAD, sitting up in bed Cardiovascular: RRR, no murmur appreciated, no LE edema Respiratory: CTAB, no increased WOB Abdomen: soft, non-tender Neuro: alert and oriented, no focal deficits Derm: no rashes or lesions noted, not diaphoretic   Laboratory: Recent Labs  Lab 11/26/18 1313 11/27/18 0217 11/28/18 0538  WBC 6.2 7.2 7.0  HGB 15.0 14.9 15.3  HCT 47.8 45.7 47.1  PLT 273 291 290   Recent Labs  Lab 11/26/18 1313 11/27/18 0217 11/28/18 0538  NA 139 141 143  K 4.5 4.0 4.0  CL 107 107 104  CO2 21* 24 27  BUN 27* 24* 21*  CREATININE 1.41* 1.34* 1.49*  CALCIUM 8.8* 9.0 9.2  GLUCOSE 138* 94 98   Troponin 0.05>0.05>0.05>0.06>0.06  Imaging/Diagnostic Tests: Dg Chest 2 View  Result Date: 11/26/2018 CLINICAL DATA:  Short of breath for 2 weeks EXAM: CHEST - 2 VIEW COMPARISON:  None. FINDINGS: Moderate cardiomegaly. Vascular congestion. No Kerley B lines. No pneumothorax or  pleural effusion. Low lung volumes with vascular crowding. IMPRESSION: Cardiomegaly and vascular congestion consistent with mild CHF. No sign of overt pulmonary edema. Electronically Signed   By: Marybelle Killings M.D.   On: 11/26/2018 14:19   Ct Angio Chest Pe W And/or Wo Contrast  Result Date: 11/26/2018 CLINICAL DATA:  Short of breath EXAM: CT ANGIOGRAPHY CHEST WITH CONTRAST TECHNIQUE: Multidetector CT imaging of  the chest was performed using the standard protocol during bolus administration of intravenous contrast. Multiplanar CT image reconstructions and MIPs were obtained to evaluate the vascular anatomy. CONTRAST:  85mL OMNIPAQUE IOHEXOL 350 MG/ML SOLN COMPARISON:  None. FINDINGS: Cardiovascular: There are no filling defects in the pulmonary arterial tree to suggest acute pulmonary thromboembolism. The left ventricle is dilated. There is reflux of contrast into the hepatic veins. Mediastinum/Nodes: No abnormal mediastinal adenopathy. No pericardial effusion. Thyroid is unremarkable esophagus is unremarkable. Lungs/Pleura: Small bilateral pleural effusions and associated dependent atelectasis. There is peribronchovascular soft tissue prominence in the hilar regions. Scattered interlobular septal thickening is noted. Upper Abdomen: No acute abnormality. Musculoskeletal: No vertebral compression deformity. Review of the MIP images confirms the above findings. IMPRESSION: No evidence of acute pulmonary thromboembolism. There is interlobular septal thickening which may represent interstitial edema associated with bilateral pleural effusions, left ventricular dilatation, and hilar soft tissue prominence. These findings can be associated with CHF. Electronically Signed   By: Marybelle Killings M.D.   On: 11/26/2018 16:53    Richarda Osmond, DO 11/28/2018, 7:40 AM PGY-1, [place] Intern pager: [phone], text pages welcome

## 2018-11-29 ENCOUNTER — Encounter (HOSPITAL_COMMUNITY): Payer: Self-pay | Admitting: Cardiovascular Disease

## 2018-11-29 DIAGNOSIS — E669 Obesity, unspecified: Secondary | ICD-10-CM

## 2018-11-29 DIAGNOSIS — I5023 Acute on chronic systolic (congestive) heart failure: Secondary | ICD-10-CM

## 2018-11-29 DIAGNOSIS — I420 Dilated cardiomyopathy: Secondary | ICD-10-CM

## 2018-11-29 DIAGNOSIS — I509 Heart failure, unspecified: Secondary | ICD-10-CM

## 2018-11-29 DIAGNOSIS — E1169 Type 2 diabetes mellitus with other specified complication: Secondary | ICD-10-CM

## 2018-11-29 LAB — CBC
HCT: 47.6 % (ref 39.0–52.0)
Hemoglobin: 15.5 g/dL (ref 13.0–17.0)
MCH: 26.5 pg (ref 26.0–34.0)
MCHC: 32.6 g/dL (ref 30.0–36.0)
MCV: 81.5 fL (ref 80.0–100.0)
Platelets: 291 10*3/uL (ref 150–400)
RBC: 5.84 MIL/uL — ABNORMAL HIGH (ref 4.22–5.81)
RDW: 15.3 % (ref 11.5–15.5)
WBC: 6.7 10*3/uL (ref 4.0–10.5)
nRBC: 0 % (ref 0.0–0.2)

## 2018-11-29 LAB — BASIC METABOLIC PANEL
Anion gap: 8 (ref 5–15)
BUN: 22 mg/dL — ABNORMAL HIGH (ref 6–20)
CO2: 29 mmol/L (ref 22–32)
Calcium: 9.2 mg/dL (ref 8.9–10.3)
Chloride: 103 mmol/L (ref 98–111)
Creatinine, Ser: 1.49 mg/dL — ABNORMAL HIGH (ref 0.61–1.24)
GFR calc Af Amer: >60 mL/min (ref >60)
GFR calc non Af Amer: 54 mL/min — ABNORMAL LOW (ref >60)
Glucose, Bld: 111 mg/dL — ABNORMAL HIGH (ref 70–99)
Potassium: 4 mmol/L (ref 3.5–5.1)
Sodium: 140 mmol/L (ref 135–145)

## 2018-11-29 MED ORDER — CARVEDILOL 12.5 MG PO TABS: 12.5000 mg | ORAL_TABLET | Freq: Two times a day (BID) | ORAL | 0 refills | Status: DC

## 2018-11-29 MED ORDER — FUROSEMIDE 20 MG PO TABS: 20.0000 mg | ORAL_TABLET | Freq: Every day | ORAL | 0 refills | Status: DC

## 2018-11-29 MED ORDER — SPIRONOLACTONE 25 MG PO TABS: 12.5000 mg | ORAL_TABLET | Freq: Every day | ORAL | 0 refills | Status: DC

## 2018-11-29 MED ORDER — FUROSEMIDE 20 MG PO TABS: 20.0000 mg | ORAL_TABLET | Freq: Every day | ORAL | Status: DC

## 2018-11-29 MED ORDER — CARVEDILOL 12.5 MG PO TABS: 12.5000 mg | ORAL_TABLET | Freq: Two times a day (BID) | ORAL | Status: DC

## 2018-11-29 MED ORDER — ATORVASTATIN CALCIUM 80 MG PO TABS: 80.0000 mg | ORAL_TABLET | Freq: Every day | ORAL | 0 refills | Status: DC

## 2018-11-29 MED ORDER — ASPIRIN 81 MG PO TBEC: 81.0000 mg | DELAYED_RELEASE_TABLET | Freq: Every day | ORAL | 0 refills | Status: DC

## 2018-11-29 NOTE — Progress Notes (Addendum)
Family Medicine Teaching Service Daily Progress Note Intern Pager: [phone]  Patient name: Nicholas Knight Medical record number: [id number] Date of birth: Nov 23, 1970 Age: 48 y.o. Gender: male  Primary Care Provider: Cleophas Dunker, DO Consultants: cardiology Code Status: full  Pt Overview and Major Events to Date:  4/25- admitted  Assessment and Plan: Nicholas Knight is a 48 y.o. male presenting with likely new onset heart failure with unknown exacerbation. PMH is significant for HTN, IDT2DM  ACS r/o, new onset heart failure-  heart rate normalized. Continues to be hypertensive but improved.  Patient had 1.4L urine output yesterday and is net -1.9L. weight is stable today at about 202.7lbs. ECG unchanged from previous. Patient has no repeat of symptoms. Asked him to get up and walk some today. Considering lisinopril wash out to start entresto. - increased coreg - continuous cardiac monitoring - f/u cards  recs - repeat ECG am - diurese. 40mg  IV lasix BID   - K+ stable. 4.0 today. Will continue to monitor with daily BMP. - ASA - incentive spirometry - CBC am - continue home lisinopril 5mg  daily for HTN - continue statin, spironolactone, and coreg - cardiac rehab, lifevest on discharge - vitals per floor protocol  - daily weights - monitor I/Os  Dyslipidemia- lipid panel this admission showed elevated LDL 100, HDL 37. - continue HI statin  IDDMT2- Hgb A1c is 6.0 this admission. Glucose 111 this morning. Patient not requiring insulin. - hold metformin and gabapentin - check blood glucose daily with BMP  AKI- Cr 1.41 on admission and GFR >60. Cr stable at 1.49 today. Consider holding lisinopril. - monitor with BMP am - avoid nephrotoxic agents.  FEN/GI: heart healthy, carb-modified diet, zofran PRN Prophylaxis: on heparin gtt   Disposition: admit to card tele  Subjective:  Patient is asymptomatic and feels fine. He just wants to know when he can go back to  work. He has not been up to walk out of his room since admission. I asked him to try to get up and moving more today and see if he has any return of symptoms. We also discussed possible medication changes, lifevest, more imaging, etc but that we will follow up with cardiology for their recs.  Objective: Temp:  [97.9 F (36.6 C)-98.7 F (37.1 C)] 98.4 F (36.9 C) (04/28 0510) Pulse Rate:  [0-295] 90 (04/28 0510) Resp:  [0-53] 18 (04/28 0510) BP: (119-162)/(92-126) 136/114 (04/28 0510) SpO2:  [0 %-100 %] 99 % (04/28 0510) Weight:  [92 kg] 92 kg (04/28 0500)   Physical Exam: General: NAD, sitting up in bed Cardiovascular: RRR, no murmur appreciated, no LE edema Respiratory: CTAB, no increased WOB Abdomen: soft, non-tender Neuro: alert and oriented, no focal deficits Derm: no rashes or lesions noted, not diaphoretic   Laboratory: Recent Labs  Lab 11/27/18 0217 11/28/18 0538  11/28/18 1401 11/28/18 1409 11/29/18 0350  WBC 7.2 7.0  --   --   --  6.7  HGB 14.9 15.3   < > 16.0 15.6 15.5  HCT 45.7 47.1   < > 47.0 46.0 47.6  PLT 291 290  --   --   --  291   < > = values in this interval not displayed.   Recent Labs  Lab 11/27/18 0217 11/28/18 0538  11/28/18 1401 11/28/18 1409 11/29/18 0350  NA 141 143   < > 142 141 140  K 4.0 4.0   < > 3.6 3.6 4.0  CL 107 104  --   --   --  103  CO2 24 27  --   --   --  29  BUN 24* 21*  --   --   --  22*  CREATININE 1.34* 1.49*  --   --   --  1.49*  CALCIUM 9.0 9.2  --   --   --  9.2  GLUCOSE 94 98  --   --   --  111*   < > = values in this interval not displayed.   Troponin 0.05>0.05>0.05>0.06>0.06  Imaging/Diagnostic Tests: Echo: 10-15% EF with global hypokinesis and LV dysfunction Cath: nonischemic cardiomyopathy with clean coronary arteries, a dilated left ventricle with an EF of 15%.  Nicholas Osmond, DO 11/29/2018, 7:17 AM PGY-1, [place] Intern pager: [phone], text pages welcome

## 2018-11-29 NOTE — Progress Notes (Signed)
Progress Note  Patient Name: Nicholas Knight Date of Encounter: 11/29/2018  Primary Cardiologist: Nicholas Knight  Subjective   Breathing much better, no chest pain.  Be discharged.  Wants to get back to work.  Inpatient Medications    Scheduled Meds: . aspirin EC  81 mg Oral Daily  . atorvastatin  80 mg Oral q1800  . carvedilol  6.25 mg Oral BID WC  . furosemide  40 mg Intravenous BID  . lisinopril  5 mg Oral Daily  . sodium chloride flush  3 mL Intravenous Q12H  . sodium chloride flush  3 mL Intravenous Q12H  . spironolactone  12.5 mg Oral Daily   Continuous Infusions: . sodium chloride    . sodium chloride     PRN Meds: sodium chloride, sodium chloride, acetaminophen, acetaminophen, guaiFENesin-dextromethorphan, morphine injection, ondansetron (ZOFRAN) IV, sodium chloride flush, sodium chloride flush   Vital Signs    Vitals:   11/29/18 0000 11/29/18 0500 11/29/18 0510 11/29/18 0830  BP: (!) 119/95  (!) 136/114 (!) 134/104  Pulse: 95  90 94  Resp: 18  18   Temp: 97.9 F (36.6 C)  98.4 F (36.9 C)   TempSrc: Oral  Oral   SpO2: 100%  99%   Weight:  92 kg    Height:        Intake/Output Summary (Last 24 hours) at 11/29/2018 0906 Last data filed at 11/29/2018 0511 Gross per 24 hour  Intake 600 ml  Output 1400 ml  Net -800 ml   Filed Weights   11/27/18 0609 11/28/18 0548 11/29/18 0500  Weight: 94.7 kg 91.8 kg 92 kg    Physical Exam   GEN: Well nourished, well developed, in no acute distress.  HEENT: Grossly normal.  Neck: Supple, no JVD, carotid bruits, or masses. Cardiac: RRR, no murmurs, rubs, or gallops. No clubbing, cyanosis, edema.  Radials/DP/PT 2+ and equal bilaterally.  Respiratory:  Respirations regular and unlabored, clear to auscultation bilaterally. GI: Soft, nontender, nondistended, BS + x 4. MS: no deformity or atrophy. Skin: warm and dry, no rash. Neuro:  Strength and sensation are intact. Psych: AAOx3.  Normal affect.  Labs     Chemistry Recent Labs  Lab 11/27/18 0217 11/28/18 0538  11/28/18 1401 11/28/18 1409 11/29/18 0350  NA 141 143   < > 142 141 140  K 4.0 4.0   < > 3.6 3.6 4.0  CL 107 104  --   --   --  103  CO2 24 27  --   --   --  29  GLUCOSE 94 98  --   --   --  111*  BUN 24* 21*  --   --   --  22*  CREATININE 1.34* 1.49*  --   --   --  1.49*  CALCIUM 9.0 9.2  --   --   --  9.2  GFRNONAA >60 54*  --   --   --  54*  GFRAA >60 >60  --   --   --  >60  ANIONGAP 10 12  --   --   --  8   < > = values in this interval not displayed.     Hematology Recent Labs  Lab 11/27/18 0217 11/28/18 0538  11/28/18 1401 11/28/18 1409 11/29/18 0350  WBC 7.2 7.0  --   --   --  6.7  RBC 5.61 5.80  --   --   --  5.84*  HGB 14.9 15.3   < > 16.0 15.6 15.5  HCT 45.7 47.1   < > 47.0 46.0 47.6  MCV 81.5 81.2  --   --   --  81.5  MCH 26.6 26.4  --   --   --  26.5  MCHC 32.6 32.5  --   --   --  32.6  RDW 15.4 15.4  --   --   --  15.3  PLT 291 290  --   --   --  291   < > = values in this interval not displayed.    Cardiac Enzymes Recent Labs  Lab 11/26/18 1737 11/26/18 2042 11/27/18 0217 11/27/18 0744  TROPONINI 0.05* 0.05* 0.06* 0.06*   No results for input(s): TROPIPOC in the last 168 hours.   BNP Recent Labs  Lab 11/26/18 1313 11/27/18 0217  BNP 778.4* 1,015.2*     DDimer  Recent Labs  Lab 11/26/18 1313  DDIMER 1.12*     Radiology    No results found.  Telemetry    NSR - Personally Reviewed  ECG    No new tracing as of 11/29/2018- Personally Reviewed  Cardiac Studies   Echocardiogram 11/27/2018: 1. The left ventricle has a visually estimated ejection fraction of 10-15%. The cavity size was moderately dilated. Left ventricular diastolic Doppler parameters are consistent with restrictive filling. Elevated left ventricular end-diastolic pressure  The E/e' is 21. Left ventricular diffuse hypokinesis. 2. The right ventricle has moderately reduced systolic function. The cavity was  mildly enlarged. There is no increase in right ventricular wall thickness. Right ventricular systolic pressure is moderately elevated with an estimated pressure of 43.1  mmHg. 3. Left atrial size was severely dilated. 4. Trivial pericardial effusion is posterior to the left ventricle and localized near the right atrium. 5. The inferior vena cava was dilated in size with <50% respiratory variability.  Right and left cardiac catheterization 11/28/2018:  IMPRESSION: Nicholas Knight has nonischemic cardiomyopathy with clean coronary arteries, a dilated left ventricle with an EF of 15%.  He will need pharmacologic optimization including carvedilol, Entresto if his blood pressure allows, spironolactone.  He may be a candidate for a LifeVest as well.  The sheaths were removed and a TR band was placed on the right wrist to achieve patent hemostasis.  The patient left lab in stable condition.  Patient Profile     48 y.o. male with a history of hypertension and diabetes who presented to the hospital with shortness of breath found to have an elevated BNP  Assessment & Plan   1.  New severely reduced LV function 62-22%/ systolic CHF:  -Unfortunately echocardiogram from 11/27/2018 showed LVEF of 10 to 15% with diffuse hypokinesis -Right and left cardiac catheterization performed yesterday, 11/28/2018 with nonischemic cardiomyopathy with normal coronary arteries and a dilated left ventricle with an EF of 15%.  Recommendations were to optimize pharmacologic therapies including carvedilol, Entresto and spironolactone -Troponin, 0.05, 0.05, 0.06, 0.06>> flat trend -I&O, net -1.8 L since hospital admission -Weight, 202lb today, 200lb on hospital admission 11/26/2018 -Creatinine, 1.49 today which appears to be at his baseline per chart review -Continue ASA 81, lisinopril 5 mg, statin, spironolactone 12.5 mg and agree with increasing carvedilol to 12.5 BID mg twice daily. May need hydralazine  -Needs better BP  control -Consider transition from lisinopril (will need washout period) to [facility] given severely reduced LV function when renal function stablizes  -LVEDP was 9.  We will stop IV Lasix.  We will  place him on 20 mg once a day.  2.  Acute on chronic kidney disease stage II:  -Creatinine, 1.49 today which appears to be at his baseline -Continue to avoid nephrotoxic medications  3.  Uncontrolled Hypertension: -Improved, 134/104, 136/114, 119/95, 129/94 -Spironolactone and carvedilol added to regimen -Likely chronic hypertension given significant LVH on EKG and echocardiogram   4.  Hyperlipidemia: -LDL, 100 -Continue high intensity statin -Will need lipid panel, LFTs in 6 to 8 weeks    Signed, Kathyrn Drown NP-C [facility] Pager: [phone] 11/29/2018, 9:06 AM     For questions or updates, please contact   Please consult [email or web address] for contact info under Cardiology/STEMI.  Personally seen and examined. Agree with above.   No evidence of coronary artery disease.  Nonischemic cardiomyopathy, likely hypertensive cardiomyopathy.  Agree with further up titration of medications.  His left ventricular end-diastolic volume or LVEDP was 9 on cardiac catheterization indicating euvolemia.  He feels comfortable walking the hallway.  I feel comfortable with him being discharged. We will make sure he is close follow-up. 1 week.  Candee Furbish, MD

## 2018-11-30 ENCOUNTER — Telehealth: Payer: Self-pay | Admitting: Physician Assistant

## 2018-11-30 NOTE — Telephone Encounter (Signed)
New message   Set up [facility] Appointment per Kathyrn Drown scheduled on 12/09/2018 with Richardson Dopp at 8:45 am.

## 2018-12-01 ENCOUNTER — Telehealth: Payer: Self-pay

## 2018-12-01 NOTE — Telephone Encounter (Signed)
Called pt to set up possible evisit with ML. I needed to go over instructions and get a phone number for a video or phone visit. If pt calls back please get a number and time when they can be reached thank you.

## 2018-12-01 NOTE — Telephone Encounter (Signed)
Called pt to give instructions on upcoming evisit.

## 2018-12-01 NOTE — Telephone Encounter (Signed)
Follow up  ° ° °Pt is returning call  ° ° °Please call back  °

## 2018-12-01 NOTE — Telephone Encounter (Addendum)
Patient contacted regarding discharge from [facility] on April 28,2020.  Patient understands to follow up with provider Ermalinda Barrios, PA on Dec 06, 2018 virtual visit 11 AM..-This was changed from May 8,2020 appt with Richardson Dopp, PA so appt would be within 7 days of discharge. (No availability on Dr Marlou Porch care team in this time frame.) Patient understands discharge instructions? yes Patient understands medications and regiment? yes Patient understands to have all medications to this visit? Yes  I spoke with patient, he activated MyChart while on the phone with me, knows to have BP/HR/weight/medications at time of virtual visit, general discussion of how virtual visit would work.  Pt verbalized understanding, thanked me for call, knows to contact our office if questions or concerns.

## 2018-12-05 ENCOUNTER — Telehealth: Payer: Self-pay

## 2018-12-05 DIAGNOSIS — E785 Hyperlipidemia, unspecified: Secondary | ICD-10-CM | POA: Insufficient documentation

## 2018-12-05 NOTE — Telephone Encounter (Signed)
Virtual Visit Pre-Appointment Phone Call  "(Name), I am calling you today to discuss your upcoming appointment. We are currently trying to limit exposure to the virus that causes COVID-19 by seeing patients at home rather than in the office."  1. "What is the BEST phone number to call the day of the visit?" - include this in appointment notes  2. "Do you have or have access to (through a family member/friend) a smartphone with video capability that we can use for your visit?" a. If yes - list this number in appt notes as "cell" (if different from BEST phone #) and list the appointment type as a VIDEO visit in appointment notes b. If no - list the appointment type as a PHONE visit in appointment notes  3. Confirm consent - "In the setting of the current Covid19 crisis, you are scheduled for a (phone or video) visit with your provider on (date) at (time).  Just as we do with many in-office visits, in order for you to participate in this visit, we must obtain consent.  If you'd like, I can send this to your mychart (if signed up) or email for you to review.  Otherwise, I can obtain your verbal consent now.  All virtual visits are billed to your insurance company just like a normal visit would be.  By agreeing to a virtual visit, we'd like you to understand that the technology does not allow for your provider to perform an examination, and thus may limit your provider's ability to fully assess your condition. If your provider identifies any concerns that need to be evaluated in person, we will make arrangements to do so.  Finally, though the technology is pretty good, we cannot assure that it will always work on either your or our end, and in the setting of a video visit, we may have to convert it to a phone-only visit.  In either situation, we cannot ensure that we have a secure connection.  Are you willing to proceed?" STAFF: Did the patient verbally acknowledge consent to telehealth visit? yes  4.  Advise patient to be prepared - "Two hours prior to your appointment, go ahead and check your blood pressure, pulse, oxygen saturation, and your weight (if you have the equipment to check those) and write them all down. When your visit starts, your provider will ask you for this information. If you have an Apple Watch or Kardia device, please plan to have heart rate information ready on the day of your appointment. Please have a pen and paper handy nearby the day of the visit as well."  5. Give patient instructions for MyChart download to smartphone OR [email or web address] as below if video visit (depending on what platform provider is using)  6. Inform patient they will receive a phone call 15 minutes prior to their appointment time (may be from unknown caller ID) so they should be prepared to answer    TELEPHONE CALL NOTE  Adolf Ormiston has been deemed a candidate for a follow-up tele-health visit to limit community exposure during the Covid-19 pandemic. I spoke with the patient via phone to ensure availability of phone/video source, confirm preferred email & phone number, and discuss instructions and expectations.  I reminded Jaicion Laurie to be prepared with any vital sign and/or heart rhythm information that could potentially be obtained via home monitoring, at the time of his visit. I reminded Ruel Dimmick to expect a phone call prior to his visit.  Ludger Nutting Kitai Purdom,  CMA 12/05/2018 1:49 PM   INSTRUCTIONS FOR DOWNLOADING THE MYCHART APP TO SMARTPHONE  - The patient must first make sure to have activated MyChart and know their login information - If Apple, go to [employer] and type in MyChart in the search bar and download the app. If Android, ask patient to go to [employer] and type in Festus in the search bar and download the app. The app is free but as with any other app downloads, their phone may require them to verify saved payment information or Apple/Android password.  - The  patient will need to then log into the app with their MyChart username and password, and select [facility] as their healthcare provider to link the account. When it is time for your visit, go to the MyChart app, find appointments, and click Begin Video Visit. Be sure to Select Allow for your device to access the Microphone and Camera for your visit. You will then be connected, and your provider will be with you shortly.  **If they have any issues connecting, or need assistance please contact MyChart service desk [phone])**  **If using a computer, in order to ensure the best quality for their visit they will need to use either of the following Internet Browsers: [employer], or Google Chrome**  IF USING DOXIMITY or DOXY.ME - The patient will receive a link just prior to their visit by text.     FULL LENGTH CONSENT FOR TELE-HEALTH VISIT   I hereby voluntarily request, consent and authorize [facility] and its employed or contracted physicians, physician assistants, nurse practitioners or other licensed health care professionals (the Practitioner), to provide me with telemedicine health care services (the "Services") as deemed necessary by the treating Practitioner. I acknowledge and consent to receive the Services by the Practitioner via telemedicine. I understand that the telemedicine visit will involve communicating with the Practitioner through live audiovisual communication technology and the disclosure of certain medical information by electronic transmission. I acknowledge that I have been given the opportunity to request an in-person assessment or other available alternative prior to the telemedicine visit and am voluntarily participating in the telemedicine visit.  I understand that I have the right to withhold or withdraw my consent to the use of telemedicine in the course of my care at any time, without affecting my right to future care or treatment, and that the  Practitioner or I may terminate the telemedicine visit at any time. I understand that I have the right to inspect all information obtained and/or recorded in the course of the telemedicine visit and may receive copies of available information for a reasonable fee.  I understand that some of the potential risks of receiving the Services via telemedicine include:  Marland Kitchen Delay or interruption in medical evaluation due to technological equipment failure or disruption; . Information transmitted may not be sufficient (e.g. poor resolution of images) to allow for appropriate medical decision making by the Practitioner; and/or  . In rare instances, security protocols could fail, causing a breach of personal health information.  Furthermore, I acknowledge that it is my responsibility to provide information about my medical history, conditions and care that is complete and accurate to the best of my ability. I acknowledge that Practitioner's advice, recommendations, and/or decision may be based on factors not within their control, such as incomplete or inaccurate data provided by me or distortions of diagnostic images or specimens that may result from electronic transmissions. I understand that the practice of  medicine is not an Chief Strategy Officer and that Practitioner makes no warranties or guarantees regarding treatment outcomes. I acknowledge that I will receive a copy of this consent concurrently upon execution via email to the email address I last provided but may also request a printed copy by calling the office of [facility].    I understand that my insurance will be billed for this visit.   I have read or had this consent read to me. . I understand the contents of this consent, which adequately explains the benefits and risks of the Services being provided via telemedicine.  . I have been provided ample opportunity to ask questions regarding this consent and the Services and have had my questions answered to my  satisfaction. . I give my informed consent for the services to be provided through the use of telemedicine in my medical care  By participating in this telemedicine visit I agree to the above.

## 2018-12-05 NOTE — Progress Notes (Signed)
Virtual Visit via Video Note   This visit type was conducted due to national recommendations for restrictions regarding the COVID-19 Pandemic (e.g. social distancing) in an effort to limit this patient's exposure and mitigate transmission in our community.  Due to his co-morbid illnesses, this patient is at least at moderate risk for complications without adequate follow up.  This format is felt to be most appropriate for this patient at this time.  All issues noted in this document were discussed and addressed.  A limited physical exam was performed with this format.  Please refer to the patient's chart for his consent to telehealth for [name].   Date:  12/06/2018   ID:  Nicholas Knight, DOB 04-06-1971, MRN [id number]  Patient Location: Home Provider Location: Home  PCP:  Cleophas Dunker, DO  Cardiologist:  Candee Furbish, MD  Electrophysiologist:  None   Evaluation Performed:  Follow-Up Visit  Chief Complaint:  F/U  History of Present Illness:    Nicholas Knight is a 48 y.o. 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  Right and left heart cath 11/28/2018 normal coronary arteries and dilated left ventricle EF 15%.  Started on lisinopril and Spironolactone.  Blood pressure was uncontrolled but improved with addition of carvedilol.  Also has CKD creatinine 1.49 close to baseline.  Patient says he feels fantastic. Denies chest pain, shortness of breath. Has cut his grass. Running 1 1/2 miles- 9 min/mile. BP controlled since home. 125/80's. Changed his diet dramatically. No salt or sugar. Has lost 10 lbs total. 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 Doesn't get a call somedays.   The patient does not have symptoms concerning for COVID-19 infection (fever, chills, cough, or new shortness of breath).    Past Medical History:  Diagnosis Date  .  Diabetes mellitus type 2 in obese ([place]) 09/12/2015  . Exertional dyspnea   . Hypertension   . Nonischemic dilated cardiomyopathy [facility])    Past Surgical History:  Procedure Laterality Date  . RIGHT/LEFT HEART CATH AND CORONARY ANGIOGRAPHY N/A 11/28/2018   Procedure: RIGHT/LEFT HEART CATH AND CORONARY ANGIOGRAPHY;  Surgeon: Lorretta Harp, MD;  Location: [place] [facility];  Service: Cardiovascular;  Laterality: N/A;     Current Meds  Medication Sig  . aspirin EC 81 MG EC tablet Take 1 tablet (81 mg total) by mouth daily.  Marland Kitchen atorvastatin (LIPITOR) 80 MG tablet Take 1 tablet (80 mg total) by mouth daily at 6 PM.  . carvedilol (COREG) 12.5 MG tablet Take 1 tablet (12.5 mg total) by mouth 2 (two) times daily with a meal.  . furosemide (LASIX) 20 MG tablet Take 1 tablet (20 mg total) by mouth daily.  Marland Kitchen lisinopril (ZESTRIL) 5 MG tablet Take 1 tablet (5 mg total) by mouth daily.  . metFORMIN (GLUCOPHAGE) 500 MG tablet TAKE 1 TABLET BY MOUTH TWICE A DAY WITH A MEAL  . spironolactone (ALDACTONE) 25 MG tablet Take 0.5 tablets (12.5 mg total) by mouth daily.  . [DISCONTINUED] atorvastatin (LIPITOR) 80 MG tablet Take 1 tablet (80 mg total) by mouth daily at 6 PM.  . [DISCONTINUED] carvedilol (COREG) 12.5 MG tablet Take 1 tablet (12.5 mg total) by mouth 2 (two) times daily with a meal.  . [DISCONTINUED] furosemide (LASIX) 20 MG tablet Take 1 tablet (20 mg total) by mouth daily.  . [DISCONTINUED] lisinopril (PRINIVIL,ZESTRIL) 5  MG tablet TAKE 1 TABLET BY MOUTH EVERY DAY  . [DISCONTINUED] spironolactone (ALDACTONE) 25 MG tablet Take 0.5 tablets (12.5 mg total) by mouth daily.     Allergies:   Penicillins   Social History   Tobacco Use  . Smoking status: Never Smoker  . Smokeless tobacco: Never Used  Substance Use Topics  . Alcohol use: No  . Drug use: No     Family Hx: The patient's family history includes Cancer in his mother; Lupus in his maternal grandmother.  ROS:   Please see the  history of present illness.    Review of Systems  Constitution: Negative.  HENT: Negative.   Cardiovascular: Negative.   Respiratory: Negative.   Endocrine: Negative.   Hematologic/Lymphatic: Negative.   Musculoskeletal: Negative.   Gastrointestinal: Negative.   Genitourinary: Negative.   Neurological: Negative.     All other systems reviewed and are negative.   Prior CV studies:   The following studies were reviewed today: Echocardiogram 11/27/2018: 1. The left ventricle has a visually estimated ejection fraction of 10-15%. The cavity size was moderately dilated. Left ventricular diastolic Doppler parameters are consistent with restrictive filling. Elevated left ventricular end-diastolic pressure  The E/e' is 21. Left ventricular diffuse hypokinesis.  2. The right ventricle has moderately reduced systolic function. The cavity was mildly enlarged. There is no increase in right ventricular wall thickness. Right ventricular systolic pressure is moderately elevated with an estimated pressure of 43.1  mmHg.  3. Left atrial size was severely dilated.  4. Trivial pericardial effusion is posterior to the left ventricle and localized near the right atrium.  5. The inferior vena cava was dilated in size with <50% respiratory variability.   Right and left cardiac catheterization 11/28/2018:   IMPRESSION: Nicholas Knight has nonischemic cardiomyopathy with clean coronary arteries, a dilated left ventricle with an EF of 15%.  He will need pharmacologic optimization including carvedilol, Entresto if his blood pressure allows, spironolactone.  He may be a candidate for a LifeVest as well.  The sheaths were removed and a TR band was placed on the right wrist to achieve patent hemostasis.  The patient left lab in stable condition.      Labs/Other Tests and Data Reviewed:    EKG:  An ECG dated 11/28/18 was personally reviewed today and demonstrated:  Normal sinus rhythm at 100 bpm with LVH and possible  left atrial enlargement  Recent Labs: 11/26/2018: TSH 2.542 11/27/2018: B Natriuretic Peptide 1,015.2 11/29/2018: BUN 22; Creatinine, Ser 1.49; Hemoglobin 15.5; Platelets 291; Potassium 4.0; Sodium 140   Recent Lipid Panel Lab Results  Component Value Date/Time   CHOL 159 11/27/2018 02:17 AM   TRIG 112 11/27/2018 02:17 AM   HDL 37 (L) 11/27/2018 02:17 AM   CHOLHDL 4.3 11/27/2018 02:17 AM   LDLCALC 100 (H) 11/27/2018 02:17 AM    Wt Readings from Last 3 Encounters:  12/06/18 200 lb (90.7 kg)  11/29/18 202 lb 12.8 oz (92 kg)  02/09/18 214 lb 9.6 oz (97.3 kg)     Objective:    Vital Signs:  BP 119/83 (BP Location: Right Arm, Patient Position: Sitting, Cuff Size: Normal)   Pulse 85   Ht '5\' 8"'  (1.727 m)   Wt 200 lb (90.7 kg)   BMI 30.41 kg/m    VITAL SIGNS:  reviewed GEN:  no acute distress RESPIRATORY:  normal respiratory effort, symmetric expansion CARDIOVASCULAR:  no peripheral edema  ASSESSMENT & PLAN:    1. 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  Cardiac cath 11/28/2018 normal coronary arteries and dilated LVEF 15%.  On lisinopril, carvedilol and spironolactone. 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  Some days he does not get any calls.  He is running 1-1/2 miles without symptoms.  Will allow him to return to work light duty starting next week.  Follow-up echo and appointment with Dr. Marlou Porch in 2 months.  We will check renal function next week. 2. Uncontrolled hypertension much better controlled on current meds and change of diet and lifestyle.  Recheck renal function. 3.   CKD creatinine 1.49 at discharge which is close to baseline-recheck 4.   Hyperlipidemia on high-dose statin will need FLP and LFTs in 2 months   COVID-19 Education: The signs and symptoms of COVID-19 were discussed with the patient and how to seek care for testing (follow up with PCP or arrange E-visit).  The  importance of social distancing was discussed today.  Time:   Today, I have spent 14:50 minutes with the patient with telehealth technology discussing the above problems.     Medication Adjustments/Labs and Tests Ordered: Current medicines are reviewed at length with the patient today.  Concerns regarding medicines are outlined above.   Tests Ordered: Orders Placed This Encounter  Procedures  . Basic metabolic panel  . Lipid panel  . Comp Met (CMET)  . ECHOCARDIOGRAM COMPLETE    Medication Changes: Meds ordered this encounter  Medications  . spironolactone (ALDACTONE) 25 MG tablet    Sig: Take 0.5 tablets (12.5 mg total) by mouth daily.    Dispense:  90 tablet    Refill:  3  . lisinopril (ZESTRIL) 5 MG tablet    Sig: Take 1 tablet (5 mg total) by mouth daily.    Dispense:  90 tablet    Refill:  3  . furosemide (LASIX) 20 MG tablet    Sig: Take 1 tablet (20 mg total) by mouth daily.    Dispense:  90 tablet    Refill:  3  . carvedilol (COREG) 12.5 MG tablet    Sig: Take 1 tablet (12.5 mg total) by mouth 2 (two) times daily with a meal.    Dispense:  180 tablet    Refill:  3  . atorvastatin (LIPITOR) 80 MG tablet    Sig: Take 1 tablet (80 mg total) by mouth daily at 6 PM.    Dispense:  90 tablet    Refill:  3    Disposition:  Follow up in 2 month(s) Dr. Marlou Porch with 2 D echo same day  Signed, Ermalinda Barrios, PA-C  12/06/2018 12:31 PM    [place]

## 2018-12-06 ENCOUNTER — Telehealth (INDEPENDENT_AMBULATORY_CARE_PROVIDER_SITE_OTHER): Payer: No Typology Code available for payment source | Admitting: Physician Assistant

## 2018-12-06 ENCOUNTER — Telehealth: Payer: No Typology Code available for payment source

## 2018-12-06 ENCOUNTER — Telehealth (INDEPENDENT_AMBULATORY_CARE_PROVIDER_SITE_OTHER): Payer: No Typology Code available for payment source | Admitting: Family Medicine

## 2018-12-06 ENCOUNTER — Other Ambulatory Visit: Payer: Self-pay

## 2018-12-06 ENCOUNTER — Encounter: Payer: Self-pay | Admitting: Physician Assistant

## 2018-12-06 ENCOUNTER — Encounter: Payer: Self-pay | Admitting: Family Medicine

## 2018-12-06 VITALS — BP 119/83 | HR 85 | Ht 68.0 in | Wt 200.0 lb

## 2018-12-06 DIAGNOSIS — I10 Essential (primary) hypertension: Secondary | ICD-10-CM

## 2018-12-06 DIAGNOSIS — I5023 Acute on chronic systolic (congestive) heart failure: Secondary | ICD-10-CM | POA: Diagnosis not present

## 2018-12-06 DIAGNOSIS — I420 Dilated cardiomyopathy: Secondary | ICD-10-CM

## 2018-12-06 DIAGNOSIS — E782 Mixed hyperlipidemia: Secondary | ICD-10-CM

## 2018-12-06 DIAGNOSIS — E1169 Type 2 diabetes mellitus with other specified complication: Secondary | ICD-10-CM

## 2018-12-06 DIAGNOSIS — E669 Obesity, unspecified: Secondary | ICD-10-CM

## 2018-12-06 DIAGNOSIS — I5022 Chronic systolic (congestive) heart failure: Secondary | ICD-10-CM

## 2018-12-06 MED ORDER — CARVEDILOL 12.5 MG PO TABS: 12.5000 mg | ORAL_TABLET | Freq: Two times a day (BID) | ORAL | 3 refills | Status: DC

## 2018-12-06 MED ORDER — LISINOPRIL 5 MG PO TABS: 5.0000 mg | ORAL_TABLET | Freq: Every day | ORAL | 3 refills | Status: DC

## 2018-12-06 MED ORDER — ATORVASTATIN CALCIUM 80 MG PO TABS: 80.0000 mg | ORAL_TABLET | Freq: Every day | ORAL | 3 refills | Status: DC

## 2018-12-06 MED ORDER — FUROSEMIDE 20 MG PO TABS: 20.0000 mg | ORAL_TABLET | Freq: Every day | ORAL | 3 refills | Status: DC

## 2018-12-06 MED ORDER — SPIRONOLACTONE 25 MG PO TABS: 12.5000 mg | ORAL_TABLET | Freq: Every day | ORAL | 3 refills | Status: DC

## 2018-12-06 NOTE — Patient Instructions (Addendum)
Medication Instructions:  Your physician recommends that you continue on your current medications as directed. Please refer to the Current Medication list given to you today.  If you need a refill on your cardiac medications before your next appointment, please call your pharmacy.   Lab work: FUTURE: BMET on 12/15/2018    [place] recommends that you return for a FASTING lipid profile and CMET in June   If you have labs (blood work) drawn today and your tests are completely normal, you will receive your results only by: Marland Kitchen MyChart Message (if you have MyChart) OR . A paper copy in the mail If you have any lab test that is abnormal or we need to change your treatment, we will call you to review the results.  Testing/Procedures: Your physician has requested that you have an echocardiogram in June. Echocardiography is a painless test that uses sound waves to create images of your heart. It provides your doctor with information about the size and shape of your heart and how well your heart's chambers and valves are working. This procedure takes approximately one hour. There are no restrictions for this procedure.    Follow-Up: Follow up with Dr. Marlou Porch in June   Any Other Special Instructions Will Be Listed Below (If Applicable).

## 2018-12-06 NOTE — Assessment & Plan Note (Signed)
48 yo male with recent hospitalization for new onset heart failure.  Condition stable with no symptoms of volume overload and blood pressures well controlled on current medication regimen.   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   Discussed repeat echo in 2 months as well as checking labs next week to monitor renal function.  Check fasting lipid panel and LFTs in 2 months.  Continue monitoring weight and bp at home. Encourage continued medication compliance.  Return precautions discussed.

## 2018-12-06 NOTE — Progress Notes (Signed)
[facility] Telemedicine Visit  Patient consented to have virtual visit.  Method of visit: Video  Encounter participants: Patient: Nicholas Knight - located at home. Provider: Lovenia Kim - located at clinic.  Others (if applicable): N/A  Chief Complaint: hospital f/u   HPI: 48 yo male scheduled for hospital follow-up from recent hospitalization.  Admitted 4/25 and discharged 4/28 for new onset heart failure with EF 10-15%.  He underwent a cardiac cath which revealed normal coronary arteries and a dilated LV.  He feels he has been doing well since leaving the hospital.  Denies chest pain and SOB.  No palpitations or leg swelling. He was started on coreg, statin, spironolactone, lisinopril, aspirin and lasix. He has been able to pick up his medications and reports good compliance on these.  Has been taking 20 mg daily without symptoms of fluid overload.   He is checking blood pressures and daily weights at home.  His systolics are noted to be in 120-125 range with diastolics of 89-21.  His weight this morning was 200 lbs, down from 202 while hospitalized. Has been limiting salt intake.  He follows with cardiology and had a telemedicine video encounter with his cardiologist this morning.  He will be getting bloodwork done next Thursday. No fever, chills, nausea, cough.   ROS: per HPI   Pertinent PMHx: nonischemic cardiomyopathy, CHF, HTN, DM, HLD  Exam:  General: pleasant 48 yo AA male, NAD  Respiratory: no respiratory distress, speaking in full sentences  Extremities: no edema or swelling present Psych: normal mood and affect   Assessment/Plan:  Acute on chronic systolic heart failure ([name]) 48 yo male with recent hospitalization for new onset heart failure.  Condition stable with no symptoms of volume overload and blood pressures well controlled on current medication regimen.   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   Discussed repeat echo in 2 months as well as checking labs next week to monitor renal function.  Check fasting lipid panel and LFTs in 2 months.  Continue monitoring weight and bp at home. Encourage continued medication compliance.  Return precautions discussed.     Time spent during visit with patient: 15 minutes  Lovenia Kim MD [place] PGY-3

## 2018-12-09 ENCOUNTER — Telehealth: Payer: No Typology Code available for payment source | Admitting: Physician Assistant

## 2018-12-13 ENCOUNTER — Other Ambulatory Visit: Payer: No Typology Code available for payment source

## 2018-12-13 ENCOUNTER — Telehealth: Payer: No Typology Code available for payment source | Admitting: Physician Assistant

## 2018-12-15 ENCOUNTER — Other Ambulatory Visit: Payer: No Typology Code available for payment source | Admitting: *Deleted

## 2018-12-15 ENCOUNTER — Other Ambulatory Visit: Payer: Self-pay

## 2018-12-15 DIAGNOSIS — I5022 Chronic systolic (congestive) heart failure: Secondary | ICD-10-CM

## 2018-12-15 DIAGNOSIS — I10 Essential (primary) hypertension: Secondary | ICD-10-CM

## 2018-12-15 DIAGNOSIS — E782 Mixed hyperlipidemia: Secondary | ICD-10-CM

## 2018-12-15 LAB — COMPREHENSIVE METABOLIC PANEL
ALT: 47 IU/L — ABNORMAL HIGH (ref 0–44)
AST: 32 IU/L (ref 0–40)
Albumin/Globulin Ratio: 1.7 (ref 1.2–2.2)
Albumin: 4.4 g/dL (ref 4.0–5.0)
Alkaline Phosphatase: 60 IU/L (ref 39–117)
BUN/Creatinine Ratio: 13 (ref 9–20)
BUN: 17 mg/dL (ref 6–24)
Bilirubin Total: 0.6 mg/dL (ref 0.0–1.2)
CO2: 23 mmol/L (ref 20–29)
Calcium: 9.8 mg/dL (ref 8.7–10.2)
Chloride: 102 mmol/L (ref 96–106)
Creatinine, Ser: 1.31 mg/dL — ABNORMAL HIGH (ref 0.76–1.27)
GFR calc Af Amer: 73 mL/min/{1.73_m2} (ref >59)
GFR calc non Af Amer: 63 mL/min/{1.73_m2} (ref >59)
Globulin, Total: 2.6 g/dL (ref 1.5–4.5)
Glucose: 136 mg/dL — ABNORMAL HIGH (ref 65–99)
Potassium: 5 mmol/L (ref 3.5–5.2)
Sodium: 141 mmol/L (ref 134–144)
Total Protein: 7 g/dL (ref 6.0–8.5)

## 2018-12-15 LAB — LIPID PANEL
Chol/HDL Ratio: 3.4 ratio (ref 0.0–5.0)
Cholesterol, Total: 115 mg/dL (ref 100–199)
HDL: 34 mg/dL — ABNORMAL LOW (ref >39)
LDL Calculated: 64 mg/dL (ref 0–99)
Triglycerides: 84 mg/dL (ref 0–149)
VLDL Cholesterol Cal: 17 mg/dL (ref 5–40)

## 2018-12-21 ENCOUNTER — Other Ambulatory Visit: Payer: Self-pay | Admitting: Student in an Organized Health Care Education/Training Program

## 2018-12-26 ENCOUNTER — Telehealth: Payer: No Typology Code available for payment source | Admitting: Family

## 2018-12-26 DIAGNOSIS — R399 Unspecified symptoms and signs involving the genitourinary system: Secondary | ICD-10-CM

## 2018-12-26 NOTE — Progress Notes (Signed)
We are sorry that you are not feeling well.  Here is how we plan to help!  Male bladder infections are not very common.  We worry about prostate or kidney conditions.  The standard of care is to examine the abdomen and kidneys, and to do a urine and blood test to make sure that something more serious is not going on.   NOTE: If you entered your credit card information for this eVisit, you will not be charged. You may see a "hold" on your card for the $35 but that hold will drop off and you will not have a charge processed.  We recommend that you see a provider today.  If your doctor's office is closed [facility] has the following Urgent Cares:  If you need care fast and have a high deductible or no insurance consider:  [email or web address] to reserve your spot online an avoid wait times  [facility] [address], [address] [place], [place] [address] Modified hours of operation: Monday-Friday, 12 PM to 6 PM  Saturday & Sunday 10 AM to 4 PM  [facility] (New Address[facility] [address], [address] [place], [place] [address] *Just off [address], across the road from [employer]* Modified hours of operation: Monday-Friday, 12 PM to 6 PM  Closed Saturday & Sunday  [facility]'s modified hours of operation will be in effect from May 1 until May 31  The following sites will take your  insurance:  . [facility]  [phone] Get Driving Directions Find a Provider at this Location  [address] [place], [place] [address] . 10 am to 8 pm Monday-Friday . 12 pm to 8 pm Saturday-Sunday   . [facility]  [phone] Get Driving Directions Find a Provider at this Location  [address], [address] [place], [place] [address] . 8 am to 8 pm Monday-Friday . 9 am to 6 pm Saturday . 11 am to 6 pm Sunday   . [facility] [place]  [phone] Get Driving  Directions  [address] [place], [place] [address] . 8 am to 8 pm Monday-Friday . 8 am to 4 pm Saturday-Sunday   Your e-visit answers were reviewed by a board certified advanced clinical practitioner to complete your personal care plan.  Thank you for using e-Visits.  

## 2019-01-02 ENCOUNTER — Ambulatory Visit (INDEPENDENT_AMBULATORY_CARE_PROVIDER_SITE_OTHER): Payer: No Typology Code available for payment source | Admitting: Family Medicine

## 2019-01-02 ENCOUNTER — Other Ambulatory Visit: Payer: Self-pay

## 2019-01-02 ENCOUNTER — Encounter: Payer: Self-pay | Admitting: Family Medicine

## 2019-01-02 VITALS — BP 118/64 | HR 64

## 2019-01-02 DIAGNOSIS — R300 Dysuria: Secondary | ICD-10-CM | POA: Diagnosis not present

## 2019-01-02 DIAGNOSIS — E1165 Type 2 diabetes mellitus with hyperglycemia: Secondary | ICD-10-CM | POA: Diagnosis not present

## 2019-01-02 LAB — POCT URINALYSIS DIP (MANUAL ENTRY)
Glucose, UA: NEGATIVE mg/dL
Ketones, POC UA: NEGATIVE mg/dL
Nitrite, UA: NEGATIVE
Protein Ur, POC: 100 mg/dL — AB
Spec Grav, UA: 1.025 (ref 1.010–1.025)
Urobilinogen, UA: 1 E.U./dL
pH, UA: 6 (ref 5.0–8.0)

## 2019-01-02 MED ORDER — METFORMIN HCL 500 MG PO TABS: ORAL_TABLET | ORAL | 0 refills | Status: DC

## 2019-01-02 MED ORDER — NITROFURANTOIN MONOHYD MACRO 100 MG PO CAPS: 100.0000 mg | ORAL_CAPSULE | Freq: Two times a day (BID) | ORAL | 0 refills | Status: AC

## 2019-01-02 NOTE — Assessment & Plan Note (Addendum)
UA with large leuks with symptoms consistent with UTI. No new sexual partners or penile discharge, no need to obtain urine cytology. No rashes, not likely herpes. Recent hospitalization for CHF, although does not appear patient had a foley cath. No evidence for pyelonephritis. Will treat with empiric abx and obtain urine culture. Will check Cr. Return in 4-6 weeks after treatment to ensure hematuria has resolved.

## 2019-01-02 NOTE — Progress Notes (Signed)
  Subjective:   Patient ID: Nicholas Knight    DOB: Mar 22, 1971, 48 y.o. male   MRN: [id number]  Nicholas Knight is a 48 y.o. male with a history of HTN, nonischemic dilated cardiomyopathy, DM2, sciatica, HLD here for   DYSURIA  Pain with urinating started a few weeks ago. Pain is: burning, located in penis Medications tried: none Penile Discharge: none Any antibiotics in the last 30 days: none More than 3 UTIs in the last 12 months: none STD exposure: none Sexually active: yes, no new partners Started on lasix recently after recent hospitalization for new onset CHF. Does not smoke.  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  Review of Systems:  Per HPI.  [place], medications and smoking status reviewed.  Objective:   BP 118/64   Pulse 64   SpO2 98%  Vitals and nursing note reviewed.  General: well nourished, well developed, in no acute distress with non-toxic appearance Abdomen: soft, non-tender, non-distended, no masses or organomegaly palpable, normoactive bowel sounds. No CVA tenderness. GU: normal penis, testicles. No abnormal masses or rashes appreciated. Skin: warm, dry, no rashes or lesions Neuro: Alert and oriented, speech normal  Assessment & Plan:   Dysuria UA with large leuks with symptoms consistent with UTI. No new sexual partners or penile discharge, no need to obtain urine cytology. No rashes, not likely herpes. Recent hospitalization for CHF, although does not appear patient had a foley cath. No evidence for pyelonephritis. Will treat with empiric abx and obtain urine culture. Will check Cr. Return in 4-6 weeks after treatment to ensure hematuria has resolved.   Orders Placed This Encounter  Procedures  . Urine Culture  . Basic Metabolic Panel  . POCT  urinalysis dipstick   Meds ordered this encounter  Medications  . metFORMIN (GLUCOPHAGE) 500 MG tablet    Sig: TAKE 1 TABLET BY MOUTH TWICE A DAY WITH A MEAL    Dispense:  30 tablet    Refill:  0  . nitrofurantoin, macrocrystal-monohydrate, (MACROBID) 100 MG capsule    Sig: Take 1 capsule (100 mg total) by mouth 2 (two) times daily for 7 days.    Dispense:  14 capsule    Refill:  0    Rory Percy, DO PGY-2, [facility] 01/02/2019 4:44 PM

## 2019-01-02 NOTE — Patient Instructions (Signed)
It was great to see you!  Our plans for today:  - Take the antibiotic for 7 days. - Come back in 4-6 weeks for repeat urine sample to make sure you no longer have blood in your urine.  We are checking some labs today, we will call you or send you a letter if they are abnormal.   Take care and seek immediate care sooner if you develop any concerns.   Dr. [facility]

## 2019-01-03 LAB — BASIC METABOLIC PANEL
BUN/Creatinine Ratio: 16 (ref 9–20)
BUN: 40 mg/dL — ABNORMAL HIGH (ref 6–24)
CO2: 24 mmol/L (ref 20–29)
Calcium: 9.1 mg/dL (ref 8.7–10.2)
Chloride: 97 mmol/L (ref 96–106)
Creatinine, Ser: 2.46 mg/dL — ABNORMAL HIGH (ref 0.76–1.27)
GFR calc Af Amer: 34 mL/min/{1.73_m2} — ABNORMAL LOW (ref >59)
GFR calc non Af Amer: 30 mL/min/{1.73_m2} — ABNORMAL LOW (ref >59)
Glucose: 92 mg/dL (ref 65–99)
Potassium: 4.2 mmol/L (ref 3.5–5.2)
Sodium: 139 mmol/L (ref 134–144)

## 2019-01-04 ENCOUNTER — Telehealth: Payer: Self-pay | Admitting: *Deleted

## 2019-01-04 LAB — URINE CULTURE

## 2019-01-04 NOTE — Telephone Encounter (Signed)
LVM on patient phone. He should come back next week to get his Creatinine rechecked. Order placed. He should stop taking ibuprofen in the meantime. No other meds changes made. He should continue his antibiotic.

## 2019-01-04 NOTE — Addendum Note (Signed)
Addended by: Myles Gip on: 01/04/2019 12:03 PM   Modules accepted: Orders

## 2019-01-04 NOTE — Telephone Encounter (Signed)
Pt returned call from MD.   Result note from DH:[facility]IX[facility] recorded by Rory Percy, DO on 01/04/2019 at 8:37 AM EDT Left message on VM to call back. Patient's creatinine is more elevated than prior. Would like to know if he is taking ibuprofen, BC/Goody powder, advil, etc. for pain and how much? Urine culture is still pending but does look like he has an infection.   Pt states that he is taking 600-800mg  of ibuprofen 2-3 times a week.  Back to MD. Christen Bame, CMA

## 2019-01-04 NOTE — Telephone Encounter (Signed)
Spoke with pt made appt to have labs done. Salvatore Marvel, CMA

## 2019-01-09 ENCOUNTER — Other Ambulatory Visit: Payer: Self-pay | Admitting: Family Medicine

## 2019-01-09 DIAGNOSIS — E1165 Type 2 diabetes mellitus with hyperglycemia: Secondary | ICD-10-CM

## 2019-01-11 ENCOUNTER — Other Ambulatory Visit: Payer: No Typology Code available for payment source

## 2019-01-11 ENCOUNTER — Other Ambulatory Visit: Payer: Self-pay

## 2019-01-11 DIAGNOSIS — E1165 Type 2 diabetes mellitus with hyperglycemia: Secondary | ICD-10-CM

## 2019-01-11 DIAGNOSIS — R300 Dysuria: Secondary | ICD-10-CM

## 2019-01-12 ENCOUNTER — Other Ambulatory Visit: Payer: Self-pay | Admitting: Family Medicine

## 2019-01-12 LAB — BASIC METABOLIC PANEL
BUN/Creatinine Ratio: 20 (ref 9–20)
BUN: 24 mg/dL (ref 6–24)
CO2: 21 mmol/L (ref 20–29)
Calcium: 9.3 mg/dL (ref 8.7–10.2)
Chloride: 103 mmol/L (ref 96–106)
Creatinine, Ser: 1.19 mg/dL (ref 0.76–1.27)
GFR calc Af Amer: 82 mL/min/{1.73_m2} (ref >59)
GFR calc non Af Amer: 71 mL/min/{1.73_m2} (ref >59)
Glucose: 173 mg/dL — ABNORMAL HIGH (ref 65–99)
Potassium: 4.6 mmol/L (ref 3.5–5.2)
Sodium: 138 mmol/L (ref 134–144)

## 2019-01-13 ENCOUNTER — Other Ambulatory Visit: Payer: Self-pay | Admitting: Family Medicine

## 2019-01-13 DIAGNOSIS — E1165 Type 2 diabetes mellitus with hyperglycemia: Secondary | ICD-10-CM

## 2019-02-14 ENCOUNTER — Other Ambulatory Visit: Payer: Self-pay

## 2019-02-14 ENCOUNTER — Ambulatory Visit (HOSPITAL_COMMUNITY): Payer: PRIVATE HEALTH INSURANCE | Attending: Cardiology

## 2019-02-14 DIAGNOSIS — I420 Dilated cardiomyopathy: Secondary | ICD-10-CM | POA: Insufficient documentation

## 2019-02-15 ENCOUNTER — Telehealth: Payer: Self-pay | Admitting: Cardiology

## 2019-02-15 NOTE — Telephone Encounter (Signed)
° °  Patient was calling about the results from his Echo done yesterday, 02/14/19

## 2019-02-16 NOTE — Telephone Encounter (Signed)
-----   Message from Imogene Burn, PA-C sent at 02/15/2019  8:58 AM EDT ----- Heart function improved from 10-15% to 20-25% but still low. Continue meds and keep f/u with Dr. Marlou Porch

## 2019-02-16 NOTE — Telephone Encounter (Signed)
Called and spoke to patient and made him aware of echo results. Patient verbalized understanding. Patient has not been scheduled for his f/u yet with Dr. Marlou Porch. Patient states that his schedule is very limited as he is a Engineer, structural in [place]. Got approval for 10:00 AM spot on 7/22 for VIDEO Visit.      Virtual Visit Pre-Appointment Phone Call  TELEPHONE CALL NOTE  Anush Wiedeman has been deemed a candidate for a follow-up tele-health visit to limit community exposure during the Covid-19 pandemic. I spoke with the patient via phone to ensure availability of phone/video source, confirm preferred email & phone number, and discuss instructions and expectations.  I reminded Rohen Kimes to be prepared with any vital sign and/or heart rhythm information that could potentially be obtained via home monitoring, at the time of his visit. I reminded Gardy Montanari to expect a phone call prior to his visit.  Patient agrees to consent below.  Cleon Gustin, RN 02/16/2019 10:27 AM  FULL LENGTH CONSENT FOR TELE-HEALTH VISIT   I hereby voluntarily request, consent and authorize [facility] and its employed or contracted physicians, physician assistants, nurse practitioners or other licensed health care professionals (the Practitioner), to provide me with telemedicine health care services (the "Services") as deemed necessary by the treating Practitioner. I acknowledge and consent to receive the Services by the Practitioner via telemedicine. I understand that the telemedicine visit will involve communicating with the Practitioner through live audiovisual communication technology and the disclosure of certain medical information by electronic transmission. I acknowledge that I have been given the opportunity to request an in-person assessment or other available alternative prior to the telemedicine visit and am voluntarily participating in the telemedicine visit.  I understand that I have the  right to withhold or withdraw my consent to the use of telemedicine in the course of my care at any time, without affecting my right to future care or treatment, and that the Practitioner or I may terminate the telemedicine visit at any time. I understand that I have the right to inspect all information obtained and/or recorded in the course of the telemedicine visit and may receive copies of available information for a reasonable fee.  I understand that some of the potential risks of receiving the Services via telemedicine include:  Marland Kitchen Delay or interruption in medical evaluation due to technological equipment failure or disruption; . Information transmitted may not be sufficient (e.g. poor resolution of images) to allow for appropriate medical decision making by the Practitioner; and/or  . In rare instances, security protocols could fail, causing a breach of personal health information.  Furthermore, I acknowledge that it is my responsibility to provide information about my medical history, conditions and care that is complete and accurate to the best of my ability. I acknowledge that Practitioner's advice, recommendations, and/or decision may be based on factors not within their control, such as incomplete or inaccurate data provided by me or distortions of diagnostic images or specimens that may result from electronic transmissions. I understand that the practice of medicine is not an exact science and that Practitioner makes no warranties or guarantees regarding treatment outcomes. I acknowledge that I will receive a copy of this consent concurrently upon execution via email to the email address I last provided but may also request a printed copy by calling the office of [facility].    I understand that my insurance will be billed for this visit.   I have read or had this  consent read to me. . I understand the contents of this consent, which adequately explains the benefits and risks of the Services  being provided via telemedicine.  . I have been provided ample opportunity to ask questions regarding this consent and the Services and have had my questions answered to my satisfaction. . I give my informed consent for the services to be provided through the use of telemedicine in my medical care  By participating in this telemedicine visit I agree to the above.

## 2019-02-22 ENCOUNTER — Telehealth (INDEPENDENT_AMBULATORY_CARE_PROVIDER_SITE_OTHER): Payer: No Typology Code available for payment source | Admitting: Cardiology

## 2019-02-22 ENCOUNTER — Other Ambulatory Visit: Payer: Self-pay

## 2019-02-22 VITALS — BP 109/82 | HR 93 | Ht 68.0 in | Wt 190.0 lb

## 2019-02-22 DIAGNOSIS — I5022 Chronic systolic (congestive) heart failure: Secondary | ICD-10-CM

## 2019-02-22 DIAGNOSIS — I420 Dilated cardiomyopathy: Secondary | ICD-10-CM | POA: Diagnosis not present

## 2019-02-22 DIAGNOSIS — E669 Obesity, unspecified: Secondary | ICD-10-CM

## 2019-02-22 DIAGNOSIS — E1169 Type 2 diabetes mellitus with other specified complication: Secondary | ICD-10-CM | POA: Diagnosis not present

## 2019-02-22 DIAGNOSIS — I10 Essential (primary) hypertension: Secondary | ICD-10-CM | POA: Diagnosis not present

## 2019-02-22 DIAGNOSIS — E782 Mixed hyperlipidemia: Secondary | ICD-10-CM

## 2019-02-22 MED ORDER — ATORVASTATIN CALCIUM 80 MG PO TABS: 80.0000 mg | ORAL_TABLET | Freq: Every day | ORAL | 3 refills | Status: DC

## 2019-02-22 MED ORDER — LISINOPRIL 5 MG PO TABS: 5.0000 mg | ORAL_TABLET | Freq: Every day | ORAL | 3 refills | Status: DC

## 2019-02-22 MED ORDER — FUROSEMIDE 20 MG PO TABS: 20.0000 mg | ORAL_TABLET | Freq: Every day | ORAL | 3 refills | Status: DC

## 2019-02-22 NOTE — Patient Instructions (Signed)
Medication Instructions:  The current medical regimen is effective;  continue present plan and medications. The refills you requested were sent electronically to your pharmacy today.  If you need a refill on your cardiac medications before your next appointment, please call your pharmacy.   You have been referred to [facility] and will be contacted to be scheduled.  Follow-Up: Follow up in 6 months with Dr. Marlou Porch.  You will receive a letter in the mail 2 months before you are due.  Please call us when you receive this letter to schedule your follow up appointment.  Thank you for choosing [facility]!!

## 2019-02-22 NOTE — Progress Notes (Signed)
Virtual Visit via Video Note   This visit type was conducted due to national recommendations for restrictions regarding the COVID-19 Pandemic (e.g. social distancing) in an effort to limit this patient's exposure and mitigate transmission in our community.  Due to his co-morbid illnesses, this patient is at least at moderate risk for complications without adequate follow up.  This format is felt to be most appropriate for this patient at this time.  All issues noted in this document were discussed and addressed.  A limited physical exam was performed with this format.  Please refer to the patient's chart for his consent to telehealth for [facility].   Date:  02/22/2019   ID:  Nicholas Knight, DOB 1970/11/20, MRN [id number]  Patient Location: Home Provider Location: Home  PCP:  Cleophas Dunker, DO  Cardiologist:  Candee Furbish, MD  Electrophysiologist:  None   Evaluation Performed:  Follow-Up Visit  Chief Complaint: Heart failure follow-up  History of Present Illness:    Nicholas Knight is a 48 y.o. male with nonischemic cardiomyopathy ejection fraction 10 to 15% originally with right and left heart catheterization in April 2020 showing normal coronary arteries, nonischemic cardiomyopathy, started on carvedilol lisinopril and spironolactone.  Repeat echocardiogram with EF 20 to 25%.  Baseline creatinine is approximately 1.4.  At last visit on 12/06/2018, he was feeling well without any significant shortness of breath, cutting his grass, running 1-1/2 miles at a 9-minute mile pace.  Blood pressures were excellent at 125 over 80s at home.  He got rid of salt and sugar in his diet and lost about 10 pounds.  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   We discussed his echocardiogram.  Overall he is feeling excellently.  No orthopnea no PND no syncope no bleeding no fevers no chills.  Taking his medications.  The patient does not have symptoms concerning for COVID-19 infection  (fever, chills, cough, or new shortness of breath).    Past Medical History:  Diagnosis Date  . Diabetes mellitus type 2 in obese (Nicholas Knight) 09/12/2015  . Exertional dyspnea   . Hypertension   . Nonischemic dilated cardiomyopathy [facility])    Past Surgical History:  Procedure Laterality Date  . RIGHT/LEFT HEART CATH AND CORONARY ANGIOGRAPHY N/A 11/28/2018   Procedure: RIGHT/LEFT HEART CATH AND CORONARY ANGIOGRAPHY;  Surgeon: Lorretta Harp, MD;  Location: [facility];  Service: Cardiovascular;  Laterality: N/A;     Current Meds  Medication Sig  . ASPIRIN ADULT LOW STRENGTH 81 MG EC tablet TAKE 1 TABLET BY MOUTH EVERY DAY (Patient taking differently: Take 81 mg by mouth daily. )  . atorvastatin (LIPITOR) 80 MG tablet Take 1 tablet (80 mg total) by mouth daily at 6 PM.  . carvedilol (COREG) 12.5 MG tablet Take 1 tablet (12.5 mg total) by mouth 2 (two) times daily with a meal.  . furosemide (LASIX) 20 MG tablet Take 1 tablet (20 mg total) by mouth daily.  Marland Kitchen lisinopril (ZESTRIL) 5 MG tablet Take 1 tablet (5 mg total) by mouth daily.  . metFORMIN (GLUCOPHAGE) 500 MG tablet TAKE 1 TABLET BY MOUTH TWICE A DAY WITH MEALS (Patient taking differently: TAKE 1 TABLET BY MOUTH  Once a DAY WITH MEALS)  . spironolactone (ALDACTONE) 25 MG tablet Take 0.5 tablets (12.5 mg total) by mouth daily.  . [DISCONTINUED] atorvastatin (LIPITOR) 80 MG tablet Take 1 tablet (80 mg total) by mouth daily at 6 PM.  . [DISCONTINUED] furosemide (LASIX) 20 MG tablet  Take 1 tablet (20 mg total) by mouth daily.  . [DISCONTINUED] lisinopril (ZESTRIL) 5 MG tablet Take 1 tablet (5 mg total) by mouth daily.     Allergies:   Penicillins   Social History   Tobacco Use  . Smoking status: Never Smoker  . Smokeless tobacco: Never Used  Substance Use Topics  . Alcohol use: No  . Drug use: No     Family Hx: The patient's family history includes Cancer in his mother; Lupus in his maternal grandmother.  ROS:   Please see  the history of present illness.     All other systems reviewed and are negative.   Prior CV studies:   The following studies were reviewed today: Cath 11/28/18: No CAD  Most Recent Value  Fick Cardiac Output 4.79 L/min  Fick Cardiac Output Index 2.28 (L/min)/BSA  RA A Wave 9 mmHg  RA V Wave 2 mmHg  RA Mean 3 mmHg  RV Systolic Pressure 52 mmHg  RV Diastolic Pressure 5 mmHg  RV EDP 9 mmHg  PA Systolic Pressure 52 mmHg  PA Diastolic Pressure 23 mmHg  PA Mean 38 mmHg  PW A Wave 29 mmHg  PW V Wave 33 mmHg  PW Mean 27 mmHg  AO Systolic Pressure 643 mmHg  AO Diastolic Pressure 85 mmHg  AO Mean 329 mmHg  LV Systolic Pressure 518 mmHg  LV Diastolic Pressure 0 mmHg  LV EDP 9 mmHg  AOp Systolic Pressure 841 mmHg  AOp Diastolic Pressure 85 mmHg  AOp Mean Pressure 660 mmHg  LVp Systolic Pressure 630 mmHg  LVp Diastolic Pressure 0 mmHg  LVp EDP Pressure 11 mmHg  QP/QS 1  TPVR Index 16.66 HRUI  TSVR Index 45.15 HRUI  PVR SVR Ratio 0.11  TPVR/TSVR Ratio 0.37    ECHO 02/14/19:  1. The left ventricle has severely reduced systolic function, with an ejection fraction of 20-25%. The cavity size was moderately dilated. There is mild concentric left ventricular hypertrophy. Left ventricular diastolic Doppler parameters are  consistent with impaired relaxation. Left ventricular diffuse hypokinesis.  2. The right ventricle has moderately reduced systolic function. The cavity was moderately enlarged. There is no increase in right ventricular wall thickness. Right ventricular systolic pressure is normal.  3. Left atrial size was mildly dilated.  4. Right atrial size was mildly dilated.  5. No stenosis of the aortic valve.  Mild improvement in ejection fraction from 10 to 15%. Labs/Other Tests and Data Reviewed:    EKG:   11/28/18 was personally reviewed today and demonstrated:  Normal sinus rhythm at 100 bpm with LVH and possible left atrial enlargement Recent Labs: 11/26/2018: TSH 2.542  11/27/2018: B Natriuretic Peptide 1,015.2 11/29/2018: Hemoglobin 15.5; Platelets 291 12/15/2018: ALT 47 01/11/2019: BUN 24; Creatinine, Ser 1.19; Potassium 4.6; Sodium 138   Recent Lipid Panel Lab Results  Component Value Date/Time   CHOL 115 12/15/2018 09:25 AM   TRIG 84 12/15/2018 09:25 AM   HDL 34 (L) 12/15/2018 09:25 AM   CHOLHDL 3.4 12/15/2018 09:25 AM   CHOLHDL 4.3 11/27/2018 02:17 AM   LDLCALC 64 12/15/2018 09:25 AM    Wt Readings from Last 3 Encounters:  02/22/19 190 lb (86.2 kg)  12/06/18 200 lb (90.7 kg)  12/06/18 200 lb (90.7 kg)     Objective:    Vital Signs:  BP 109/82   Pulse 93   Ht 5\' 8"  (1.727 m)   Wt 190 lb (86.2 kg)   BMI 28.89 kg/m    VITAL  SIGNS:  reviewed GEN:  no acute distress EYES:  sclerae anicteric, EOMI - Extraocular Movements Intact RESPIRATORY:  normal respiratory effort, symmetric expansion SKIN:  no rash, lesions or ulcers. MUSCULOSKELETAL:  no obvious deformities. NEURO:  alert and oriented x 3, no obvious focal deficit PSYCH:  normal affect  ASSESSMENT & PLAN:    Nonischemic cardiomyopathy, chronic systolic heart failure  - No CAD on cardiac catheterization - EF with mild improvement to 20 to 25% - NYHA class I (able to run a mile and a half).  Based upon his lack of symptoms, I am not sure of major benefit with defibrillator but would like second opinion from advanced heart failure team. - He is on carvedilol 12.5 mg twice a day as well as lisinopril 5 mg a day.  Taking 12.5 mg of spironolactone a day.  He is on atorvastatin 80 mg a day (note no significant CAD on cath). -In review of his creatinine, it fluctuated from 1.49-1.31 up to 2.46 on 01/02/2019 then back down to 1.19 on 01/11/2019. - Since he is young with a new diagnosis of nonischemic cardiomyopathy with continued low ejection fraction, albeit NYHA class I, I would like for him to sit down in consultation with our advanced heart failure team to make sure that there in agreement  with current treatment strategy.    COVID-19 Education: The signs and symptoms of COVID-19 were discussed with the patient and how to seek care for testing (follow up with PCP or arrange E-visit).  The importance of social distancing was discussed today.  Time:   Today, I have spent 59minutes with the patient with telehealth technology discussing the above problems.     Medication Adjustments/Labs and Tests Ordered: Current medicines are reviewed at length with the patient today.  Concerns regarding medicines are outlined above.   Tests Ordered: Orders Placed This Encounter  Procedures  . AMB referral to CHF clinic    Medication Changes: Meds ordered this encounter  Medications  . atorvastatin (LIPITOR) 80 MG tablet    Sig: Take 1 tablet (80 mg total) by mouth daily at 6 PM.    Dispense:  90 tablet    Refill:  3  . lisinopril (ZESTRIL) 5 MG tablet    Sig: Take 1 tablet (5 mg total) by mouth daily.    Dispense:  90 tablet    Refill:  3  . furosemide (LASIX) 20 MG tablet    Sig: Take 1 tablet (20 mg total) by mouth daily.    Dispense:  90 tablet    Refill:  3    Follow Up:  Virtual Visit or In Person in 6 month(s)  Signed, Candee Furbish, MD  02/22/2019 10:17 AM    [place] [place]

## 2019-05-09 ENCOUNTER — Ambulatory Visit (HOSPITAL_COMMUNITY)
Admission: RE | Admit: 2019-05-09 | Discharge: 2019-05-09 | Disposition: A | Discharge status: Home / Self Care | Payer: No Typology Code available for payment source | Source: Ambulatory Visit | Attending: Internal Medicine | Admitting: Internal Medicine

## 2019-05-09 ENCOUNTER — Other Ambulatory Visit: Payer: Self-pay

## 2019-05-09 ENCOUNTER — Encounter (HOSPITAL_COMMUNITY): Payer: Self-pay | Admitting: Internal Medicine

## 2019-05-09 VITALS — BP 156/93 | HR 94 | Wt 207.0 lb

## 2019-05-09 DIAGNOSIS — I5022 Chronic systolic (congestive) heart failure: Secondary | ICD-10-CM

## 2019-05-09 DIAGNOSIS — I420 Dilated cardiomyopathy: Secondary | ICD-10-CM | POA: Insufficient documentation

## 2019-05-09 DIAGNOSIS — Z79899 Other long term (current) drug therapy: Secondary | ICD-10-CM | POA: Diagnosis not present

## 2019-05-09 DIAGNOSIS — Z7982 Long term (current) use of aspirin: Secondary | ICD-10-CM | POA: Diagnosis not present

## 2019-05-09 DIAGNOSIS — Z7984 Long term (current) use of oral hypoglycemic drugs: Secondary | ICD-10-CM | POA: Insufficient documentation

## 2019-05-09 DIAGNOSIS — Z8249 Family history of ischemic heart disease and other diseases of the circulatory system: Secondary | ICD-10-CM | POA: Diagnosis not present

## 2019-05-09 DIAGNOSIS — E669 Obesity, unspecified: Secondary | ICD-10-CM | POA: Insufficient documentation

## 2019-05-09 DIAGNOSIS — R0683 Snoring: Secondary | ICD-10-CM | POA: Diagnosis not present

## 2019-05-09 DIAGNOSIS — E1122 Type 2 diabetes mellitus with diabetic chronic kidney disease: Secondary | ICD-10-CM | POA: Insufficient documentation

## 2019-05-09 DIAGNOSIS — I130 Hypertensive heart and chronic kidney disease with heart failure and stage 1 through stage 4 chronic kidney disease, or unspecified chronic kidney disease: Secondary | ICD-10-CM | POA: Diagnosis not present

## 2019-05-09 DIAGNOSIS — I10 Essential (primary) hypertension: Secondary | ICD-10-CM | POA: Diagnosis not present

## 2019-05-09 DIAGNOSIS — N184 Chronic kidney disease, stage 4 (severe): Secondary | ICD-10-CM | POA: Diagnosis not present

## 2019-05-09 LAB — BASIC METABOLIC PANEL
Anion gap: 11 (ref 5–15)
BUN: 18 mg/dL (ref 6–20)
CO2: 25 mmol/L (ref 22–32)
Calcium: 9.4 mg/dL (ref 8.9–10.3)
Chloride: 104 mmol/L (ref 98–111)
Creatinine, Ser: 1.29 mg/dL — ABNORMAL HIGH (ref 0.61–1.24)
GFR calc Af Amer: >60 mL/min (ref >60)
GFR calc non Af Amer: >60 mL/min (ref >60)
Glucose, Bld: 154 mg/dL — ABNORMAL HIGH (ref 70–99)
Potassium: 4.3 mmol/L (ref 3.5–5.1)
Sodium: 140 mmol/L (ref 135–145)

## 2019-05-09 MED ORDER — ENTRESTO 24-26 MG PO TABS: 1.0000 | ORAL_TABLET | Freq: Two times a day (BID) | ORAL | 3 refills | Status: DC

## 2019-05-09 NOTE — Patient Instructions (Signed)
Stop Lisinopril  Start Entresto 24/26 mg Twice daily STARTING WED 10/7 PM  Labs done today, we will contact you for any abnormal results  Your provider has recommended that you have a home sleep study.  [employer] is the company that does these test.  They will contact you by phone and must speak with you before they can ship the equipment.  Once they have spoken with you they will send the equipment right to your home with instructions on how to set it up.  Once you have completed the test you just dispose of the equipment, the information is automatically uploaded to [place] via [employer] technology.  IF you have any questions or issues with the equipment please call the company directly at [phone].  If your test is positive for sleep apnea and you need a home CPAP machine you will be contacted by Dr Theodosia Blender office [facility]) to set this up.  Please follow up with our heart failure pharmacist in 2-3 weeks  Your physician has requested that you have a cardiac MRI. Cardiac MRI uses a computer to create images of your heart as its beating, producing both still and moving pictures of your heart and major blood vessels. For further information please visit [email or web address] Please follow the instruction sheet given to you today for more information.  Your physician recommends that you schedule a follow-up appointment in: 6 weeks  If you have any questions or concerns before your next appointment please send [place] a message through [facility] or call our office at [phone].  At the [facility], you and your health needs are our priority. As part of our continuing mission to provide you with exceptional heart care, we have created designated Provider Care Teams. These Care Teams include your primary Cardiologist (physician) and Advanced Practice Providers (APPs- Physician Assistants and Nurse Practitioners) who all work together to provide you with the care you need, when you  need it.   You may see any of the following providers on your designated Care Team at your next follow up: Marland Kitchen Dr Glori Bickers . Dr Loralie Champagne . Darrick Grinder, NP   Please be sure to bring in all your medications bottles to every appointment.

## 2019-05-09 NOTE — Progress Notes (Addendum)
ADVANCED HF CLINIC CONSULT NOTE  Referring Physician: Marlou Porch Primary Cardiologist: Marlou Porch  HPI:  Nicholas Knight is a 48 y.o. male with DM2, HTN, CKD 3 and systolic heart failure due to nonischemic cardiomyopathy EF 10 to 15% (diagnosed in 4/20) who is referred by Dr. Marlou Porch for further management of his HF.   Presented with new onset HF in 4/20. EF 10-15%. Right and left heart catheterization in April 2020 showing normal coronary arteries.  Repeat echocardiogram 02/14/19 EF 20 to 25%.  At last visit on 12/06/2018, he was feeling well without any significant shortness of breath, cutting his grass, running 1-1/2 miles at a 9-minute mile pace.  Blood pressures were excellent at 125 over 80s at home.  He got rid of salt and sugar in his diet and lost about 10 pounds.  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  Overall he is feeling excellent. Can run 1.5 miles w/o limitation. No exertional dyspnea. Denies LEE. No orthopnea or PND. Denies palpitations, syncope/ near syncope. He has been compliant w/ meds but did not take today. Has been tolerating regimen well. Has been told that he sores but no prior sleep study. Has FH of MI (father) but no HF or SCD. No tobacco or ETOH use. No familial genetic disorders to his knowledge. Denies vial syndrome prior to diagnosis of HF. Has had HTN for many years.      Review of Systems: [y] = yes, [ ]  = no   General: Weight gain [ ] ; Weight loss [ ] ; Anorexia [ ] ; Fatigue [ ] ; Fever [ ] ; Chills [ ] ; Weakness [ ]   Cardiac: Chest pain/pressure [ ] ; Resting SOB [ ] ; Exertional SOB [ ] ; Orthopnea [ ] ; Pedal Edema [ ] ; Palpitations [ ] ; Syncope [ ] ; Presyncope [ ] ; Paroxysmal nocturnal dyspnea[ ]   Pulmonary: Cough [ ] ; Wheezing[ ] ; Hemoptysis[ ] ; Sputum [ ] ; Snoring Blue.Reese ]  GI: Vomiting[ ] ; Dysphagia[ ] ; Melena[ ] ; Hematochezia [ ] ; Heartburn[ ] ; Abdominal pain [ ] ; Constipation [ ] ; Diarrhea [ ] ; BRBPR [ ]   GU: Hematuria[ ] ; Dysuria [ ] ; Nocturia[ ]   Vascular:  Pain in legs with walking [ ] ; Pain in feet with lying flat [ ] ; Non-healing sores [ ] ; Stroke [ ] ; TIA [ ] ; Slurred speech [ ] ;  Neuro: Headaches[ ] ; Vertigo[ ] ; Seizures[ ] ; Paresthesias[ ] ;Blurred vision [ ] ; Diplopia [ ] ; Vision changes [ ]   Ortho/Skin: Arthritis [ ] ; Joint pain [ ] ; Muscle pain [ ] ; Joint swelling [ ] ; Back Pain [ ] ; Rash [ ]   Psych: Depression[ ] ; Anxiety[ ]   Heme: Bleeding problems [ ] ; Clotting disorders [ ] ; Anemia [ ]   Endocrine: Diabetes [ ] ; Thyroid dysfunction[ ]    Past Medical History:  Diagnosis Date  . Diabetes mellitus type 2 in obese ([place]) 09/12/2015  . Exertional dyspnea   . Hypertension   . Nonischemic dilated cardiomyopathy ([facility])     Current Outpatient Medications  Medication Sig Dispense Refill  . ASPIRIN ADULT LOW STRENGTH 81 MG EC tablet TAKE 1 TABLET BY MOUTH EVERY DAY (Patient taking differently: Take 81 mg by mouth daily. ) 30 tablet 3  . atorvastatin (LIPITOR) 80 MG tablet Take 1 tablet (80 mg total) by mouth daily at 6 PM. 90 tablet 3  . carvedilol (COREG) 12.5 MG tablet Take 1 tablet (12.5 mg total) by mouth 2 (two) times daily with a meal. 180 tablet 3  . furosemide (LASIX) 20 MG tablet Take 1 tablet (20  mg total) by mouth daily. 90 tablet 3  . lisinopril (ZESTRIL) 5 MG tablet Take 1 tablet (5 mg total) by mouth daily. 90 tablet 3  . metFORMIN (GLUCOPHAGE) 500 MG tablet TAKE 1 TABLET BY MOUTH TWICE A DAY WITH MEALS (Patient taking differently: TAKE 1 TABLET BY MOUTH  Once a DAY WITH MEALS) 60 tablet 11  . spironolactone (ALDACTONE) 25 MG tablet Take 0.5 tablets (12.5 mg total) by mouth daily. 90 tablet 3   No current facility-administered medications for this encounter.     Allergies  Allergen Reactions  . Penicillins Other (See Comments)    Childhood; uknown reaction      Social History   Socioeconomic History  . Marital status: Single    Spouse name: Not on file  . Number of children: Not on file  . Years of education: Not on  file  . Highest education level: Not on file  Occupational History  . Not on file  Social Needs  . Financial resource strain: Not on file  . Food insecurity    Worry: Not on file    Inability: Not on file  . Transportation needs    Medical: Not on file    Non-medical: Not on file  Tobacco Use  . Smoking status: Never Smoker  . Smokeless tobacco: Never Used  Substance and Sexual Activity  . Alcohol use: No  . Drug use: No  . Sexual activity: Yes  Lifestyle  . Physical activity    Days per week: Not on file    Minutes per session: Not on file  . Stress: Not on file  Relationships  . Social Herbalist on phone: Not on file    Gets together: Not on file    Attends religious service: Not on file    Active member of club or organization: Not on file    Attends meetings of clubs or organizations: Not on file    Relationship status: Not on file  . Intimate partner violence    Fear of current or ex partner: Not on file    Emotionally abused: Not on file    Physically abused: Not on file    Forced sexual activity: Not on file  Other Topics Concern  . Not on file  Social History Narrative  . Not on file      Family History  Problem Relation Age of Onset  . Cancer Mother   . Lupus Maternal Grandmother    Vitals:   05/09/19 1450  BP: (!) 156/93  Pulse: 94  SpO2: 98%  Weight: 93.9 kg (207 lb)    PHYSICAL EXAM: General:  Well appearing, physically fit, young AAM. No respiratory difficulty HEENT: normal anicteric  Neck: supple. no JVD. Carotids 2+ bilat; no bruits. No lymphadenopathy or thryomegaly appreciated. Cor: PMI nondisplaced. Regular rate & rhythm. No rubs, gallops or murmurs.  Lungs: clear no wheeze Abdomen: soft, nontender, nondistended. No hepatosplenomegaly. No bruits or masses. Good bowel sounds. Extremities: no cyanosis, clubbing, rash, edema Neuro: alert & oriented x 3, cranial nerves grossly intact. moves all 4 extremities w/o difficulty.  Affect pleasant   ECG: Sinus tach 100 with LVH Personally reviewed   ASSESSMENT & PLAN:  1. Chronic systolic HF due to NICM - onset 4/20. EF 15-20% cath with normal coronaries - echo 7/20 EF 20-25% -Suspect likely 2/2 long standing poorly controlled HTN - NYHA I - Volume status - will need cMRI - will arrange home sleep study (snoring  history) - stop lisinopril. 36 hr washout. Start Entresto 24-26 bid tomorrow night (10/7) -BMP today -can switch from daily to PRN Lasix  -Continue spironolactone 25 mg -Continue Coreg 12.5 bid  -Consider future addition of an SGLT2i  2. HTN: elevated today, 152/93. Missed AM Meds. -We discussed HTN being the likely cause of his CM and importance of tight control of BP and strict medication compliance. -Switch from Lisinopril to Entresto, per above -Continue spironolactone -Continue Coreg   3. DM2. Followed by PCP -Consider future addition of an SGLT2i   F/u w/ pharmacist in 2-3 weeks for further med titration. F/u with Dr. Haroldine Laws after cardiac MRI  Lyda Jester PA-C  Patient seen and examined with the above-signed Advanced Practice Provider and/or Housestaff. I personally reviewed laboratory data, imaging studies and relevant notes. I independently examined the patient and formulated the important aspects of the plan. I have edited the note to reflect any of my changes or salient points. I have personally discussed the plan with the patient and/or family.  48 y/o male with h/o HTN, DM2 and CKD 4 who was diagnosed with systolic HF due to NICM in 4/20 with EF 20-25%. Cath with normal coronaries. F/u echo showed EF 30-35%  Doing well. NYHA I  Volume status ok but remains hypertensive. 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 Will get cMRI. Stop lasix. Switch lisinopril to Entresto. Order home sleep study. Stressed need to be compliant with meds and get BP down. Refer to PharmD clinic for aggressive med titration. Consider  SGLT2i.   Glori Bickers, MD  3:54 PM

## 2019-05-09 NOTE — Progress Notes (Signed)
Height:     Weight: BMI:  Today's Date:  STOP BANG RISK ASSESSMENT S (snore) Have you been told that you snore?     YES   T (tired) Are you often tired, fatigued, or sleepy during the day?   YES  O (obstruction) Do you stop breathing, choke, or gasp during sleep? NO   P (pressure) Do you have or are you being treated for high blood pressure? YES   B (BMI) Is your body index greater than 35 kg/m? NO   A (age) Are you 50 years old or older? YES   N (neck) Do you have a neck circumference greater than 16 inches?      G (gender) Are you a male? YES   TOTAL STOP/BANG "YES" ANSWERS 5                                                                       For Office Use Only              Procedure Order Form    YES to 3+ Stop Bang questions OR two clinical symptoms - patient qualifies for WatchPAT (CPT 95800)      Clinical Notes: Will consult Sleep Specialist and refer for management of therapy due to patient increased risk of Sleep Apnea. Ordering a sleep study due to the following two clinical symptoms: Excessive daytime sleepiness G47.10 / Loud snoring R06.83   Which test do you need, WP1 or WP300???  . Do you have access to a smart device containing the app stores?  If YES, then -->WP1   If NO, then  --->WP300

## 2019-05-12 ENCOUNTER — Other Ambulatory Visit: Payer: Self-pay

## 2019-05-15 MED ORDER — ASPIRIN 81 MG PO TBEC: 81.0000 mg | DELAYED_RELEASE_TABLET | Freq: Every day | ORAL | 3 refills | Status: DC

## 2019-05-24 ENCOUNTER — Telehealth (HOSPITAL_COMMUNITY): Payer: Self-pay

## 2019-05-24 DIAGNOSIS — I5022 Chronic systolic (congestive) heart failure: Secondary | ICD-10-CM

## 2019-05-24 NOTE — Telephone Encounter (Signed)
Orders Placed This Encounter  Procedures  . MR CARDIAC MORPHOLOGY W WO CONTRAST    Standing Status:   Future    Standing Expiration Date:   07/23/2020    Order Specific Question:   If indicated for the ordered procedure, I authorize the administration of contrast media per Radiology protocol    Answer:   Yes    Order Specific Question:   What is the patient's sedation requirement?    Answer:   No Sedation    Order Specific Question:   Does the patient have a pacemaker or implanted devices?    Answer:   No    Order Specific Question:   Call Results- Best Contact Number?    Answer:   [phone]    Order Specific Question:   Radiology Contrast Protocol - do NOT remove file path    Answer:   [email or web address]    Order Specific Question:   Preferred imaging location?    Answer:   [facility] (table limit-500 lbs)

## 2019-05-24 NOTE — Telephone Encounter (Signed)
Faxed Patient Demographics and Insurance Information to [employer] @ [phone] on 05/24/2019. 2nd Attempt

## 2019-05-29 ENCOUNTER — Telehealth (HOSPITAL_COMMUNITY): Payer: Self-pay

## 2019-05-29 NOTE — Telephone Encounter (Signed)
Received a request for Missing information (  Height & Weight ) from [employer]. Faxed to [phone] on 05/29/2019.

## 2019-05-31 ENCOUNTER — Inpatient Hospital Stay (HOSPITAL_COMMUNITY)
Admission: RE | Admit: 2019-05-31 | Discharge: 2019-05-31 | Disposition: A | Discharge status: Home / Self Care | Payer: No Typology Code available for payment source | Source: Ambulatory Visit

## 2019-06-14 ENCOUNTER — Telehealth (HOSPITAL_COMMUNITY): Payer: Self-pay | Admitting: Emergency Medicine

## 2019-06-14 NOTE — Telephone Encounter (Signed)
Left message on voicemail with name and callback number Marten Iles RN Navigator Cardiac Imaging [facility] [phone] Office [phone] Cell  

## 2019-06-15 ENCOUNTER — Inpatient Hospital Stay (HOSPITAL_COMMUNITY): Admission: RE | Admit: 2019-06-15 | Payer: No Typology Code available for payment source | Source: Ambulatory Visit

## 2019-06-16 ENCOUNTER — Ambulatory Visit (HOSPITAL_COMMUNITY): Payer: No Typology Code available for payment source

## 2019-06-20 ENCOUNTER — Other Ambulatory Visit: Payer: Self-pay

## 2019-06-20 ENCOUNTER — Ambulatory Visit (HOSPITAL_COMMUNITY)
Admission: RE | Admit: 2019-06-20 | Discharge: 2019-06-20 | Disposition: A | Discharge status: Home / Self Care | Payer: PRIVATE HEALTH INSURANCE | Source: Ambulatory Visit | Attending: Cardiology | Admitting: Cardiology

## 2019-06-20 VITALS — BP 138/98 | HR 96 | Wt 208.0 lb

## 2019-06-20 DIAGNOSIS — E119 Type 2 diabetes mellitus without complications: Secondary | ICD-10-CM | POA: Insufficient documentation

## 2019-06-20 DIAGNOSIS — N183 Chronic kidney disease, stage 3 unspecified: Secondary | ICD-10-CM | POA: Insufficient documentation

## 2019-06-20 DIAGNOSIS — I428 Other cardiomyopathies: Secondary | ICD-10-CM | POA: Insufficient documentation

## 2019-06-20 DIAGNOSIS — I130 Hypertensive heart and chronic kidney disease with heart failure and stage 1 through stage 4 chronic kidney disease, or unspecified chronic kidney disease: Secondary | ICD-10-CM | POA: Diagnosis not present

## 2019-06-20 DIAGNOSIS — Z79899 Other long term (current) drug therapy: Secondary | ICD-10-CM | POA: Insufficient documentation

## 2019-06-20 DIAGNOSIS — Z7984 Long term (current) use of oral hypoglycemic drugs: Secondary | ICD-10-CM | POA: Insufficient documentation

## 2019-06-20 DIAGNOSIS — I5023 Acute on chronic systolic (congestive) heart failure: Secondary | ICD-10-CM | POA: Diagnosis present

## 2019-06-20 DIAGNOSIS — E1165 Type 2 diabetes mellitus with hyperglycemia: Secondary | ICD-10-CM

## 2019-06-20 LAB — BASIC METABOLIC PANEL
Anion gap: 11 (ref 5–15)
BUN: 19 mg/dL (ref 6–20)
CO2: 21 mmol/L — ABNORMAL LOW (ref 22–32)
Calcium: 9.2 mg/dL (ref 8.9–10.3)
Chloride: 103 mmol/L (ref 98–111)
Creatinine, Ser: 1.21 mg/dL (ref 0.61–1.24)
GFR calc Af Amer: >60 mL/min (ref >60)
GFR calc non Af Amer: >60 mL/min (ref >60)
Glucose, Bld: 328 mg/dL — ABNORMAL HIGH (ref 70–99)
Potassium: 4.5 mmol/L (ref 3.5–5.1)
Sodium: 135 mmol/L (ref 135–145)

## 2019-06-20 MED ORDER — METFORMIN HCL 500 MG PO TABS: 500.0000 mg | ORAL_TABLET | Freq: Every day | ORAL | 11 refills | Status: DC

## 2019-06-20 MED ORDER — CARVEDILOL 25 MG PO TABS: 25.0000 mg | ORAL_TABLET | Freq: Two times a day (BID) | ORAL | 3 refills | Status: DC

## 2019-06-20 MED ORDER — SPIRONOLACTONE 25 MG PO TABS: 25.0000 mg | ORAL_TABLET | Freq: Every day | ORAL | 3 refills | Status: DC

## 2019-06-20 NOTE — Patient Instructions (Signed)
It was a pleasure seeing you today!  MEDICATIONS: -We are changing your medications today -Increase carvedilol to 25 mg (1 tab) twice daily. You may take 2 of the 12.5 mg tablets twice a day until you run out of your current prescription.  -Increase spironolactone to 25 mg (1 tab) daily. -Call if you have questions about your medications.  LABS: -We will call you if your labs need attention.  NEXT APPOINTMENT: Return to clinic in 1 week for Labs Return to clinic in 3 weeks for Pharmacy Clinic  In general, to take care of your heart failure: -Limit your fluid intake to 2 Liters (half-gallon) per day.   -Limit your salt intake to ideally 2-3 grams (2000-3000 mg) per day. -Weigh yourself daily and record, and bring that "weight diary" to your next appointment.  (Weight gain of 2-3 pounds in 1 day typically means fluid weight.) -The medications for your heart are to help your heart and help you live longer.   -Please contact us before stopping any of your heart medications.  Call the clinic at [phone] with questions or to reschedule future appointments.

## 2019-06-21 NOTE — Progress Notes (Signed)
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 Marlou Porch for further management of his HF.   Presented with new onset HF in 4/20. EF 10-15%. Right and left heart catheterization in April 2020 showing normal coronary arteries.  Repeat echocardiogram 02/14/19 EF 20 to 25%.  Recently presented to HF Clinic on 05/09/19 with Dr. Haroldine Laws. 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 Overall he was feeling excellent. Can run 1.5 miles w/o limitation. No exertional dyspnea. Denied LEE. No orthopnea or PND. Denied palpitations, syncope/ near syncope. He has been compliant w/ meds but had not taken them yet that day. Had been tolerating regimen well. Has been told that he snores but no prior sleep study. Has FH of MI (father) but no HF or SCD. No tobacco or ETOH use. No familial genetic disorders to his knowledge. Denied viral syndrome prior to diagnosis of HF. Has had HTN for many years.  Today he returns to HF clinic for pharmacist medication titration. At last visit with MD, lisinopril was discontinued and Entresto 24/26 mg BID was started after a 36 hour washout period. Furosemide was changed from daily to PRN, but he never made that change and continued to take furosemide daily. No dizziness, lightheadedness, chest pain or palpitations. No SOB/DOE. He has been walking 2 miles three times weekly with no limitations. His weight has been stable at 201-203 lbs. No LEE, PND or orthopnea. His appetite is good. He follows a low sodium diet but does not practice fluid restriction.      . Shortness of breath/dyspnea on exertion? no  . Orthopnea/PND? no . Edema? no . Lightheadedness/dizziness? no . Daily weights at home? Weighs himself consistently, but not daily. Ranges 201-203 lbs.  . Blood  pressure/heart rate monitoring at home? yes . Following low-sodium/fluid-restricted diet? Follows low sodium diet, does not fluid restrict.   HF Medications:  Carvedilol 12.5 mg BID Entresto 24/26 mg BID Spironolactone 12.5 mg daily Furosemide 20 mg daily  Has the patient been experiencing any side effects to the medications prescribed?  no  Does the patient have any problems obtaining medications due to transportation or finances?   No - has Pharmacist, community  Understanding of regimen: good Understanding of indications: good Potential of compliance: good Patient understands to avoid NSAIDs. Patient understands to avoid decongestants.    Pertinent Lab Values: . Serum creatinine 1.21, BUN 19, Potassium 4.5, Sodium 135.  Vital Signs: . Weight: 208 lbs (previous clinic weight: 207 lbs) . Blood pressure: 138/98  . Heart rate: 96   Assessment: 1. Chronic systolic HF due to NICM - Onset 4/20. EF 15-20% cath with normal coronaries - echo 7/20 EF 20-25% -Suspect likely 2/2 long standing poorly controlled HTN - NYHA I  - Euvolemic on exam.  - Vitals: BP elevated at 138/98, HR 96 - Labs: Scr stable at 1.21, K 4.5 - Continue furosemide 20 mg daily given stable renal function and no signs of orthostasis.  - Increase carvedilol to 25 mg BID. - Continue Entresto 24/26 mg BID. Will likely increase at next visit pending tolerability and stable renal function.  - Increase spironolactone to 25 mg daily. Repeat BMET in 1 week.  - Consider future addition of an SGLT2i for benefits in HF and T2DM. - will need cMRI  2. HTN: elevated today,  138/98. -We discussed HTN being the likely cause of his CM and importance of tight control of BP and strict medication compliance. -Increase carvedilol and spironolactone as above. Continue Entresto.   3. DM2. Followed by PCP -Hemoglobin A1C 6.0 (11/26/18) -Continue metformin 500 mg daily -Continue atorvastatin 80 mg daily.  -Stop taking aspirin  81 mg daily given increased risk of bleeding without appreciable benefit for primary prevention of ASCVD.  -Consider future addition of an SGLT2i    Plan: 1) Medication changes: Based on clinical presentation, vital signs and recent labs will increase carvedilol to 25 mg twice daily and spironolactone to 25 mg daily. Stop taking aspirin 81 mg daily. 2) Labs: Scr 1.21, K 4.5 3) Follow-up: Repeat BMET in 1 week. Return to Pharmacy Clinic in 3 weeks.   Audry Riles, PharmD, BCPS, CPP Heart Failure Clinic Pharmacist [phone]  Discussed patient with Dr. Lynelle Smoke.  Agree with assessment and plan.  Increase carvedilol at this visit with plans to increase Entresto in near future.  Monitor VS and renal function.  Bonnita Nasuti Pharm.D. CPP, BCPS Clinical Pharmacist [phone] 06/21/2019 10:14 PM

## 2019-06-22 ENCOUNTER — Encounter (HOSPITAL_COMMUNITY): Payer: No Typology Code available for payment source | Admitting: Internal Medicine

## 2019-06-22 ENCOUNTER — Telehealth (HOSPITAL_COMMUNITY): Payer: Self-pay

## 2019-06-22 NOTE — Telephone Encounter (Signed)
Called Patient to discuss his [facility] Sleep Study. LMOM in detail for patient to return call to office.

## 2019-06-28 ENCOUNTER — Other Ambulatory Visit (HOSPITAL_COMMUNITY): Payer: No Typology Code available for payment source

## 2019-06-28 ENCOUNTER — Other Ambulatory Visit (HOSPITAL_COMMUNITY): Payer: Self-pay | Admitting: *Deleted

## 2019-06-28 DIAGNOSIS — I5022 Chronic systolic (congestive) heart failure: Secondary | ICD-10-CM

## 2019-07-07 ENCOUNTER — Telehealth (HOSPITAL_COMMUNITY): Payer: Self-pay

## 2019-07-07 NOTE — Telephone Encounter (Signed)
[facility] Sleep Study Voided after 3 Attempts to contact.

## 2019-07-10 ENCOUNTER — Inpatient Hospital Stay (HOSPITAL_COMMUNITY)
Admission: RE | Admit: 2019-07-10 | Discharge: 2019-07-10 | Disposition: A | Discharge status: Home / Self Care | Payer: No Typology Code available for payment source | Source: Ambulatory Visit

## 2019-08-04 DIAGNOSIS — E1110 Type 2 diabetes mellitus with ketoacidosis without coma: Secondary | ICD-10-CM

## 2019-08-04 HISTORY — DX: Type 2 diabetes mellitus with ketoacidosis without coma: E11.10

## 2019-08-11 ENCOUNTER — Encounter (HOSPITAL_COMMUNITY): Payer: No Typology Code available for payment source | Admitting: Internal Medicine

## 2019-08-16 ENCOUNTER — Other Ambulatory Visit: Payer: Self-pay

## 2019-08-16 ENCOUNTER — Encounter (HOSPITAL_COMMUNITY): Payer: Self-pay | Admitting: Emergency Medicine

## 2019-08-16 ENCOUNTER — Inpatient Hospital Stay (HOSPITAL_COMMUNITY)
Admission: EM | Admit: 2019-08-16 | Discharge: 2019-08-18 | DRG: 638 | Disposition: A | Discharge status: Home / Self Care | Payer: PRIVATE HEALTH INSURANCE | Attending: Family Medicine | Admitting: Family Medicine

## 2019-08-16 DIAGNOSIS — E782 Mixed hyperlipidemia: Secondary | ICD-10-CM

## 2019-08-16 DIAGNOSIS — E860 Dehydration: Secondary | ICD-10-CM | POA: Diagnosis present

## 2019-08-16 DIAGNOSIS — E1165 Type 2 diabetes mellitus with hyperglycemia: Secondary | ICD-10-CM | POA: Diagnosis present

## 2019-08-16 DIAGNOSIS — Z7984 Long term (current) use of oral hypoglycemic drugs: Secondary | ICD-10-CM

## 2019-08-16 DIAGNOSIS — Z79899 Other long term (current) drug therapy: Secondary | ICD-10-CM

## 2019-08-16 DIAGNOSIS — N179 Acute kidney failure, unspecified: Secondary | ICD-10-CM | POA: Diagnosis present

## 2019-08-16 DIAGNOSIS — Z20822 Contact with and (suspected) exposure to covid-19: Secondary | ICD-10-CM | POA: Diagnosis present

## 2019-08-16 DIAGNOSIS — I5022 Chronic systolic (congestive) heart failure: Secondary | ICD-10-CM | POA: Diagnosis not present

## 2019-08-16 DIAGNOSIS — I110 Hypertensive heart disease with heart failure: Secondary | ICD-10-CM | POA: Diagnosis present

## 2019-08-16 DIAGNOSIS — E1110 Type 2 diabetes mellitus with ketoacidosis without coma: Principal | ICD-10-CM | POA: Diagnosis present

## 2019-08-16 DIAGNOSIS — I10 Essential (primary) hypertension: Secondary | ICD-10-CM | POA: Diagnosis not present

## 2019-08-16 DIAGNOSIS — E785 Hyperlipidemia, unspecified: Secondary | ICD-10-CM | POA: Diagnosis present

## 2019-08-16 DIAGNOSIS — Z880 Allergy status to penicillin: Secondary | ICD-10-CM

## 2019-08-16 HISTORY — DX: Heart failure, unspecified: I50.9

## 2019-08-16 LAB — BASIC METABOLIC PANEL
Anion gap: 18 — ABNORMAL HIGH (ref 5–15)
Anion gap: 18 — ABNORMAL HIGH (ref 5–15)
BUN: 25 mg/dL — ABNORMAL HIGH (ref 6–20)
BUN: 25 mg/dL — ABNORMAL HIGH (ref 6–20)
CO2: 16 mmol/L — ABNORMAL LOW (ref 22–32)
CO2: 16 mmol/L — ABNORMAL LOW (ref 22–32)
Calcium: 9.8 mg/dL (ref 8.9–10.3)
Calcium: 9.8 mg/dL (ref 8.9–10.3)
Chloride: 92 mmol/L — ABNORMAL LOW (ref 98–111)
Chloride: 100 mmol/L (ref 98–111)
Creatinine, Ser: 1.74 mg/dL — ABNORMAL HIGH (ref 0.61–1.24)
Creatinine, Ser: 1.7 mg/dL — ABNORMAL HIGH (ref 0.61–1.24)
GFR calc Af Amer: 52 mL/min — ABNORMAL LOW (ref >60)
GFR calc Af Amer: 53 mL/min — ABNORMAL LOW (ref >60)
GFR calc non Af Amer: 45 mL/min — ABNORMAL LOW (ref >60)
GFR calc non Af Amer: 46 mL/min — ABNORMAL LOW (ref >60)
Glucose, Bld: 725 mg/dL (ref 70–99)
Glucose, Bld: 520 mg/dL (ref 70–99)
Potassium: 5.3 mmol/L — ABNORMAL HIGH (ref 3.5–5.1)
Potassium: 4.4 mmol/L (ref 3.5–5.1)
Sodium: 126 mmol/L — ABNORMAL LOW (ref 135–145)
Sodium: 134 mmol/L — ABNORMAL LOW (ref 135–145)

## 2019-08-16 LAB — CBC
HCT: 46.4 % (ref 39.0–52.0)
Hemoglobin: 15.6 g/dL (ref 13.0–17.0)
MCH: 27.5 pg (ref 26.0–34.0)
MCHC: 33.6 g/dL (ref 30.0–36.0)
MCV: 81.7 fL (ref 80.0–100.0)
Platelets: 315 10*3/uL (ref 150–400)
RBC: 5.68 MIL/uL (ref 4.22–5.81)
RDW: 13.3 % (ref 11.5–15.5)
WBC: 8 10*3/uL (ref 4.0–10.5)
nRBC: 0 % (ref 0.0–0.2)

## 2019-08-16 LAB — URINALYSIS, ROUTINE W REFLEX MICROSCOPIC
Bilirubin Urine: NEGATIVE
Glucose, UA: >=500 mg/dL — AB
Hgb urine dipstick: NEGATIVE
Ketones, ur: 80 mg/dL — AB
Nitrite: NEGATIVE
Protein, ur: NEGATIVE mg/dL
Specific Gravity, Urine: 1.024 (ref 1.005–1.030)
pH: 5 (ref 5.0–8.0)

## 2019-08-16 LAB — POCT I-STAT EG7
Acid-base deficit: 9 mmol/L — ABNORMAL HIGH (ref 0.0–2.0)
Bicarbonate: 17.7 mmol/L — ABNORMAL LOW (ref 20.0–28.0)
Calcium, Ion: 1.22 mmol/L (ref 1.15–1.40)
HCT: 47 % (ref 39.0–52.0)
Hemoglobin: 16 g/dL (ref 13.0–17.0)
O2 Saturation: 81 %
Potassium: 5.8 mmol/L — ABNORMAL HIGH (ref 3.5–5.1)
Sodium: 126 mmol/L — ABNORMAL LOW (ref 135–145)
TCO2: 19 mmol/L — ABNORMAL LOW (ref 22–32)
pCO2, Ven: 41 mmHg — ABNORMAL LOW (ref 44.0–60.0)
pH, Ven: 7.243 — ABNORMAL LOW (ref 7.250–7.430)
pO2, Ven: 52 mmHg — ABNORMAL HIGH (ref 32.0–45.0)

## 2019-08-16 LAB — GLUCOSE, CAPILLARY
Glucose-Capillary: 243 mg/dL — ABNORMAL HIGH (ref 70–99)
Glucose-Capillary: 374 mg/dL — ABNORMAL HIGH (ref 70–99)

## 2019-08-16 LAB — CBG MONITORING, ED
Glucose-Capillary: >600 mg/dL (ref 70–99)
Glucose-Capillary: >600 mg/dL (ref 70–99)
Glucose-Capillary: >600 mg/dL (ref 70–99)
Glucose-Capillary: 514 mg/dL (ref 70–99)

## 2019-08-16 LAB — BETA-HYDROXYBUTYRIC ACID: Beta-Hydroxybutyric Acid: 6.03 mmol/L — ABNORMAL HIGH (ref 0.05–0.27)

## 2019-08-16 LAB — POC SARS CORONAVIRUS 2 AG -  ED: SARS Coronavirus 2 Ag: NEGATIVE

## 2019-08-16 MED ORDER — SODIUM CHLORIDE 0.9 % IV SOLN: INTRAVENOUS | Status: DC

## 2019-08-16 MED ORDER — INSULIN REGULAR(HUMAN) IN NACL 100-0.9 UT/100ML-% IV SOLN
INTRAVENOUS | Status: DC
  Administered 2019-08-16 – 2019-08-17 (×2): 13 [IU]/h via INTRAVENOUS
  Filled 2019-08-16 (×2): qty 100

## 2019-08-16 MED ORDER — DEXTROSE-NACL 5-0.45 % IV SOLN: INTRAVENOUS | Status: DC

## 2019-08-16 MED ORDER — SODIUM CHLORIDE 0.9 % IV BOLUS
1000.0000 mL | INTRAVENOUS | Status: AC
  Administered 2019-08-16: 1000 mL via INTRAVENOUS

## 2019-08-16 MED ORDER — ENOXAPARIN SODIUM 40 MG/0.4ML ~~LOC~~ SOLN
40.0000 mg | SUBCUTANEOUS | Status: DC
  Administered 2019-08-17 – 2019-08-18 (×2): 40 mg via SUBCUTANEOUS
  Filled 2019-08-16 (×2): qty 0.4

## 2019-08-16 MED ORDER — DEXTROSE 50 % IV SOLN: 0.0000 mL | INTRAVENOUS | Status: DC | PRN

## 2019-08-16 MED ORDER — POTASSIUM CHLORIDE 10 MEQ/100ML IV SOLN
10.0000 meq | INTRAVENOUS | Status: AC
  Administered 2019-08-17 (×2): 10 meq via INTRAVENOUS
  Filled 2019-08-16 (×2): qty 100

## 2019-08-16 NOTE — ED Triage Notes (Signed)
Pt reports hyperglycemia for a week with frequent urination, fatigue, dry mouth. CBG reading high

## 2019-08-16 NOTE — H&P (Addendum)
[facility] Admission History and Physical Service Pager: [phone]  Patient name: Nicholas Knight Medical record number: [id number] Date of birth: May 31, 1971 Age: 49 y.o. Gender: male  Primary Care Provider: Cleophas Dunker, DO Consultants: None Code Status: FULL Preferred Emergency Contact: Ihor Gully, significant other, at [phone]  Chief Complaint: DKA  Assessment and Plan: Nicholas Knight is a 49 y.o. male presenting with diabetic ketoacidosis. PMH is significant for type 2 diabetes, hypertension, hyperlipidemia, HFrEF.  DKA/hyperglycemia with history of type 2 diabetes Patient with a history of hyperglycemia and type 2 diabetes that presents in DKA with symptoms starting about 1.5 weeks ago.  Patient recently had some medication changes as his sugars had apparently been well controlled prior to this.  He says that his PCP had stopped his insulin a few months ago, and it appears that his Levemir was stopped on discharge from a hospitalization in April 2020.  He also says that his PCP reduced his metformin from 500 mg BID to 500 mg once daily.  Patient also endorses that he has not adhered to his diet as closely as normal during the holiday season.  In the emergency department patient's glucose was noted to be in excess of 600, potassium initially 5.3, anion gap elevated at 18.  Bicarb reduced at 16.  Initial sodium of 126, corrected to 136.  Urinalysis was significant for glucose in excess of 500, ketones of 80, moderate leukocytes, few bacteria.  Venous blood gas showed pH 7.24 with CO2 of 41 bicarb 17.7.  Most recent A1c 6.0 11/26/2018.  It seems patient's hyperglycemic episode and DKA are most likely related to a combination of some recent changes in medications as well as patient not adhering to his normal diet as closely as he usually does recently.  We will follow DKA protocol and continue to monitor patient status. -Admit to progressive, attending  Dr. Andria Frames -BMP every 4 hours -Monitor CBGs per protocol -Beta hydroxybutyric acid every 8 hours -Normal saline at 75 mL/h and D5 half-normal saline at 75 mL/h along with insulin drip -N.p.o. except for occasional ice chips per patient request -A.m. A1c -Continuous cardiac monitoring -Continuous pulse ox -PT/OT once patient stabilizes from a DKA standpoint -watch respiratory and fluid status given patient's CHF  AKI Most recent creatinine 1.74, patient's baseline appears to be around 1.2-1.4.  Etiology is most likely related to dehydration as patient is currently in DKA.   -Fluids as noted above -Continue to monitor creatinine -Avoid nephrotoxic agents  Hypertension  Most recent blood pressure 128/83.  Currently normotensive.  Home medications include Coreg, Entresto, spironolactone -Hold home medications as patient is currently n.p.o.  Hyperlipidemia Medications include atorvastatin 80 mg -Hold home medications as patient is currently n.p.o.   HFrEF Most recent echocardiogram on 02/14/2019 shows ejection fraction of 20-25%.  Previous echo on 11/27/2018 shows ejection fraction 10-15%, moderately dilated cavity, medications include Coreg, Lasix, Entresto, Aldactone -Hold home medications as patient is currently n.p.o. -Closely monitor patient's fluid status -Fluids as noted above -may need to continue holding Entresto and diuretics for a few days d/t AKI and dehydration  FEN/GI: N.p.o. Prophylaxis: Lovenox  Disposition: Admit to progressive  History of Present Illness:  Nicholas Knight is a 49 y.o. male presenting with hyperglycemia and DKA.  He notes that he began to feel fatigued, dizzy, nauseated, and noticed very high sugars on his glucometer (>500) over the past week.  He also notes polyuria and polydypsia.  He also notes that he has  had some blurry vision which was the same as he experienced when he was first diagnosed with diabetes.  He says that he didn't eat as carefully as  he should over the holidays but continued to take his medications.  He was taking metformin twice daily when he started feeling bad about 1.5 weeks ago but did not notice much difference.  He does not smoke or consume alcohol.  Works as a Engineer, structural.  Review Of Systems: Per HPI with the following additions:   Review of Systems  Constitutional: Negative for fever.  HENT: Negative for congestion and sore throat.   Eyes: Positive for blurred vision.  Respiratory: Negative for cough and shortness of breath.   Cardiovascular: Negative for chest pain, orthopnea and leg swelling.  Gastrointestinal: Positive for nausea. Negative for abdominal pain and vomiting.  Genitourinary: Positive for frequency.  Musculoskeletal: Negative for myalgias.  Neurological: Positive for dizziness. Negative for headaches.    Patient Active Problem List   Diagnosis Date Noted  . Severe hyperglycemia due to diabetes mellitus ([place]) 08/16/2019  . DKA, type 2 ([place]) 08/16/2019  . Dysuria 01/02/2019  . Hyperlipidemia 12/05/2018  . New onset of congestive heart failure ([place])   . Heart failure with preserved ejection fraction ([place]) 11/28/2018  . Chronic systolic congestive heart failure ([place]) 11/28/2018  . Exertional dyspnea   . Nonischemic dilated cardiomyopathy ([place])   . Sciatica of right side 02/09/2018  . Diabetes mellitus type 2 in obese ([place]) 09/12/2015  . Diabetic ketoacidosis without coma associated with type 2 diabetes mellitus ([place])   . Hypertension 07/07/2012    Past Medical History: Past Medical History:  Diagnosis Date  . Diabetes mellitus type 2 in obese ([place]) 09/12/2015  . Exertional dyspnea   . Hypertension   . Nonischemic dilated cardiomyopathy [facility])     Past Surgical History: Past Surgical History:  Procedure Laterality Date  . RIGHT/LEFT HEART CATH AND CORONARY ANGIOGRAPHY N/A 11/28/2018   Procedure: RIGHT/LEFT HEART CATH AND CORONARY ANGIOGRAPHY;  Surgeon: Lorretta Harp, MD;   Location: [facility];  Service: Cardiovascular;  Laterality: N/A;    Social History: Social History   Tobacco Use  . Smoking status: Never Smoker  . Smokeless tobacco: Never Used  Substance Use Topics  . Alcohol use: No  . Drug use: No   Additional social history: None Please also refer to relevant sections of EMR.  Family History: Family History  Problem Relation Age of Onset  . Cancer Mother   . Lupus Maternal Grandmother     Allergies and Medications: Allergies  Allergen Reactions  . Penicillins Other (See Comments)    Did it involve swelling of the face/tongue/throat, SOB, or low BP? N/A Did it involve sudden or severe rash/hives, skin peeling, or any reaction on the inside of your mouth or nose?N/A Did you need to seek medical attention at a hospital or doctor's office? N/A When did it last happen?Childhood  If all above answers are "NO", may proceed with cephalosporin use.   No current facility-administered medications on file prior to encounter.   Current Outpatient Medications on File Prior to Encounter  Medication Sig Dispense Refill  . atorvastatin (LIPITOR) 80 MG tablet Take 1 tablet (80 mg total) by mouth daily at 6 PM. 90 tablet 3  . carvedilol (COREG) 25 MG tablet Take 1 tablet (25 mg total) by mouth 2 (two) times daily with a meal. 180 tablet 3  . furosemide (LASIX) 20 MG tablet Take 1 tablet (  20 mg total) by mouth daily. 90 tablet 3  . metFORMIN (GLUCOPHAGE) 500 MG tablet Take 1 tablet (500 mg total) by mouth daily with breakfast. 60 tablet 11  . sacubitril-valsartan (ENTRESTO) 24-26 MG Take 1 tablet by mouth 2 (two) times daily. 60 tablet 3  . spironolactone (ALDACTONE) 25 MG tablet Take 1 tablet (25 mg total) by mouth daily. 90 tablet 3    Objective: BP 109/75   Pulse 94   Temp 98.6 F (37 C) (Oral)   Resp 19   Ht 5\' 9"  (1.753 m)   Wt 86.2 kg   SpO2 99%   BMI 28.06 kg/m  Exam: General: Alert and oriented, no apparent  distress  Eyes: EOM intact ENTM: No pharyengeal erythema, dry appearing mouth. Neck: nontender Cardiovascular: RRR with no murmurs noted Respiratory: CTA bilaterally  Gastrointestinal: Bowel sounds present. No abdominal pain Derm: No rashes noted Psych: Behavior and speech appropriate to situation  Labs and Imaging: CBC BMET  Recent Labs  Lab 08/16/19 1647 08/16/19 1944  WBC 8.0  --   HGB 15.6 16.0  HCT 46.4 47.0  PLT 315  --    Recent Labs  Lab 08/16/19 1647 08/16/19 1944  NA 126* 126*  K 5.3* 5.8*  CL 92*  --   CO2 16*  --   BUN 25*  --   CREATININE 1.74*  --   GLUCOSE 725*  --   CALCIUM 9.8  --       Lurline Del, DO 08/16/2019, 9:56 PM PGY-1, [place] Intern pager: [phone], text pages welcome  FPTS Upper-Level Resident Addendum   I have independently interviewed and examined the patient. I have discussed the above with the original author and agree with their documentation. My edits for correction/addition/clarification are in blue. Please see also any attending notes.    Kathrene Alu, MD PGY-3, [facility] 08/16/2019 10:03 PM  FPTS Service pager: [phone] (text pages welcome through [place])

## 2019-08-16 NOTE — ED Notes (Signed)
Dr Laverta Baltimore aware Glucose 725

## 2019-08-16 NOTE — Discharge Summary (Addendum)
[facility] Discharge Summary  Patient name: Nicholas Knight Medical record number: [id number] Date of birth: 1970/08/05 Age: 49 y.o. Gender: male Date of Admission: 08/16/2019  Date of Discharge: 08/18/19 Admitting Physician: Lyndee Hensen, MD  Primary Care Provider: Cleophas Dunker, DO Consultants: None  Indication for Hospitalization: DKA  Discharge Diagnoses/Problem List:  Active Problems:   AKI (acute kidney injury) ([place])   Diabetic ketoacidosis without coma associated with type 2 diabetes mellitus ([place])   Chronic systolic congestive heart failure ([facility])   Severe hyperglycemia due to diabetes mellitus ([place])   DKA, type 2 ([place])   DKA (diabetic ketoacidoses) ([place])  Disposition: discharge home   Discharge Condition: stable and improved   Discharge Exam:  General: male appearing stated age lying in bed in NAD  Cardiovascular: RRR without murmurs or friction rub Respiratory: CTAB without wheezing or crackles, stable on RA  Abdomen: soft, NT, bowel sounds present  Extremities: No LE edema   Brief Hospital Course:   Mild DKA Patient was admitted after some nausea, urinary frequency, and fatigue for 1.5 weeks.  On initial presentation found to have glucose in excess of 600, AG of 18, pH 7.2.  In the emergency department patient was started on DKA protocol.  BMPs were monitored every 4 hours to watch his potassium and anion gap, as well as his glucose level.  Patient's anion gap had closed by 08/17/19 and was transitioned off the insulin drip at that time as well as given a carb modified diet.  On discharge patient's anion gap had remained closed and his glucose upon discharge was still elevated at 376. Patient's diabetic medications on admission included Metformin 500 mg which would be contiuned.  On discharge his diabetic medications were Metformin 500 daily, Levemir 30 units at night.   HFrEF Patient with past medical history significant for HFrEF  with most recent echo showing ejection fraction of 20-25%.  During the patient's admission his fluid status was monitored closely as he was quite dehydrated due to his DKA.  He was started on fluids in the emergency department and this was continued throughout the first day of his hospitalization while he was on insulin drip. Fluids were discontinued and patient remained euvolemic throughout admission. Patient's Delene [name] was held in acute setting of AKI and restarted prior to discharge. His Lasix and spirolactone were held as patient's Cr increased prior to discharge.   AKI  Patient was admitted with Cr. Elevated at 1.7 and decreased to 1.1 with another increase to 1.2. This is around his baseline Cr.   HTN and HLD were managed with patient's home medications once he was tolerating PO medications. BP meds were held as detailed above.   Issues for Follow Up:  Sent home with Levemir 30 units and home Metformin 500 mg daily, please ensure he is tolerating this well. It is likely he will remain a insulin-dependent diabetic (suspect his diabetes progression was the underlying etiology of DKA presentation).  Please obtain a BMP on follow-up to ensure creatinine/GFR stability, then recommend titrating up his Metformin and Levemir as appropriate. Please consider adding SGLT2 inhibitor as outpatient management for DM pending normal electrolyte/creatinine levels.  Please use BMP to determine if patient should be restarted on Lasix and spironolactone during follow up appointment (restarted several potentially nephrotoxic medications prior to DC which is why it was decided to stepwise reinitiate these medications).   Significant Procedures:   none  Significant Labs and Imaging:  Recent Labs  Lab 08/16/19  1647 08/16/19 1944  WBC 8.0  --   HGB 15.6 16.0  HCT 46.4 47.0  PLT 315  --    Recent Labs  Lab 08/17/19 0216 08/17/19 0216 08/17/19 0604 08/17/19 0604 08/17/19 1026 08/17/19 1026 08/17/19 1459  08/18/19 0617  NA 137  --  137  --  134*  --  135 136  K 4.2   < > 4.0   < > 4.0   < > 4.3 4.4  CL 103  --  104  --  101  --  102 103  CO2 22  --  23  --  24  --  22 23  GLUCOSE 166*  --  172*  --  189*  --  293* 344*  BUN 22*  --  21*  --  19  --  19 15  CREATININE 1.26*  --  1.12  --  1.10  --  1.20 1.26*  CALCIUM 9.4  --  9.0  --  8.9  --  8.8* 8.9  ALKPHOS  --   --   --   --   --   --   --  50  AST  --   --   --   --   --   --   --  21  ALT  --   --   --   --   --   --   --  27  ALBUMIN  --   --   --   --   --   --   --  2.9*   < > = values in this interval not displayed.    Results/Tests Pending at Time of Discharge: none  Discharge Medications:  Allergies as of 08/18/2019       Reactions   Penicillins Other (See Comments)   Did it involve swelling of the face/tongue/throat, SOB, or low BP? N/A Did it involve sudden or severe rash/hives, skin peeling, or any reaction on the inside of your mouth or nose?N/A Did you need to seek medical attention at a hospital or doctor's office? N/A When did it last happen? Childhood    If all above answers are "NO", may proceed with cephalosporin use.        Medication List     STOP taking these medications    furosemide 20 MG tablet Commonly known as: LASIX   spironolactone 25 MG tablet Commonly known as: ALDACTONE       TAKE these medications    atorvastatin 80 MG tablet Commonly known as: LIPITOR Take 1 tablet (80 mg total) by mouth daily at 6 PM.   carvedilol 25 MG tablet Commonly known as: COREG Take 1 tablet (25 mg total) by mouth 2 (two) times daily with a meal.   Entresto 24-26 MG Generic drug: sacubitril-valsartan Take 1 tablet by mouth 2 (two) times daily.   insulin detemir 100 UNIT/ML injection Commonly known as: Levemir Inject 0.3 mLs (30 Units total) into the skin at bedtime.   metFORMIN 500 MG tablet Commonly known as: GLUCOPHAGE Take 1 tablet (500 mg total) by mouth daily with breakfast.   Pen  Needles 3/16" 31G X 5 MM Misc Use to inject insulin at bed time        Discharge Instructions: Please refer to Patient Instructions section of EMR for full details.  Patient was counseled important signs and symptoms that should prompt return to medical care, changes in medications, dietary instructions, activity restrictions, and follow  up appointments.   Follow-Up Appointments: Follow-up Information     Jerline Pain, MD .   Specialty: Cardiology Contact information: [phone] [address] [address] [phone]         [place]. Go on 08/21/2019.   Why: Appointment at 11:00 AM, please arrive 10 minutes early.  It is of the utmost importance that you make this follow-up to titrate your diabetes medicines. Contact information: [place]           [name], MD 08/18/2019, 3:00 PM PGY-1, [place] Upper-Level Resident Addendum   I have discussed the above with the original author and agree with their documentation. Please see also any attending notes.    Patriciaann Clan, DO  Family Medicine PGY-2

## 2019-08-16 NOTE — ED Notes (Signed)
CBG Results of " HI >600 "  Reported to [place], Therapist, sports.

## 2019-08-16 NOTE — ED Provider Notes (Signed)
[facility] EMERGENCY DEPARTMENT Provider Note   CSN: [id number] Arrival date & time: 08/16/19  1555     History Chief Complaint  Patient presents with  . Hyperglycemia    Nicholas Knight is a 49 y.o. male the past medical history of diabetes, dilated cardiomyopathy.  He presents the emergency department with a chief complaint of elevated blood sugars.  Patient states about 2 to 3 months ago his blood sugars were doing so well all the time that his primary care doctor took him off of his insulin.  It continued to do well so she backed him down from twice a day Metformin to once a day Metformin.  Patient states about 3 or 4 weeks ago he started noticing that his blood sugars were trending upward.  He began taking this his second dose of Metformin however it was not working well.  Patient states that this morning when he awoke his blood sugar was about 500.  He ate and then checked it again later and it was unreadable a high so he decided to come in.  He has noticed over the past 3 weeks that he has had polyuria, polydipsia, mild nausea after eating.  He denies abdominal pain, chest pain, shortness of breath.  HPI     Past Medical History:  Diagnosis Date  . Diabetes mellitus type 2 in obese ([place]) 09/12/2015  . Exertional dyspnea   . Hypertension   . Nonischemic dilated cardiomyopathy [facility])     Patient Active Problem List   Diagnosis Date Noted  . Severe hyperglycemia due to diabetes mellitus ([place]) 08/16/2019  . DKA, type 2 ([place]) 08/16/2019  . Dysuria 01/02/2019  . Hyperlipidemia 12/05/2018  . New onset of congestive heart failure ([place])   . Heart failure with preserved ejection fraction ([place]) 11/28/2018  . Chronic systolic congestive heart failure ([place]) 11/28/2018  . Exertional dyspnea   . Nonischemic dilated cardiomyopathy ([place])   . Sciatica of right side 02/09/2018  . Diabetes mellitus type 2 in obese ([place]) 09/12/2015  . Diabetic ketoacidosis without coma  associated with type 2 diabetes mellitus ([place])   . Hypertension 07/07/2012    Past Surgical History:  Procedure Laterality Date  . RIGHT/LEFT HEART CATH AND CORONARY ANGIOGRAPHY N/A 11/28/2018   Procedure: RIGHT/LEFT HEART CATH AND CORONARY ANGIOGRAPHY;  Surgeon: Lorretta Harp, MD;  Location: [facility];  Service: Cardiovascular;  Laterality: N/A;       Family History  Problem Relation Age of Onset  . Cancer Mother   . Lupus Maternal Grandmother     Social History   Tobacco Use  . Smoking status: Never Smoker  . Smokeless tobacco: Never Used  Substance Use Topics  . Alcohol use: No  . Drug use: No    Home Medications Prior to Admission medications   Medication Sig Start Date End Date Taking? Authorizing Provider  atorvastatin (LIPITOR) 80 MG tablet Take 1 tablet (80 mg total) by mouth daily at 6 PM. 02/22/19  Yes Skains, Thana Farr, MD  carvedilol (COREG) 25 MG tablet Take 1 tablet (25 mg total) by mouth 2 (two) times daily with a meal. 06/20/19  Yes Bensimhon, Shaune Pascal, MD  furosemide (LASIX) 20 MG tablet Take 1 tablet (20 mg total) by mouth daily. 02/22/19  Yes Jerline Pain, MD  metFORMIN (GLUCOPHAGE) 500 MG tablet Take 1 tablet (500 mg total) by mouth daily with breakfast. 06/20/19  Yes Bensimhon, Shaune Pascal, MD  sacubitril-valsartan (ENTRESTO) 24-26 MG Take  1 tablet by mouth 2 (two) times daily. 05/09/19  Yes Bensimhon, Shaune Pascal, MD  spironolactone (ALDACTONE) 25 MG tablet Take 1 tablet (25 mg total) by mouth daily. 06/20/19  Yes Bensimhon, Shaune Pascal, MD    Allergies    Penicillins  Review of Systems   Review of Systems Ten systems reviewed and are negative for acute change, except as noted in the HPI. \  Physical Exam Updated Vital Signs BP 113/78 (BP Location: Left Arm)   Pulse (!) 120   Temp 98 F (36.7 C) (Axillary)   Resp 16   Ht 5\' 9"  (1.753 m)   Wt 86.3 kg   SpO2 100%   BMI 28.09 kg/m   Physical Exam Vitals and nursing note reviewed.    Constitutional:      General: He is not in acute distress.    Appearance: He is well-developed. He is not diaphoretic.  HENT:     Head: Normocephalic and atraumatic.  Eyes:     General: No scleral icterus.    Conjunctiva/sclera: Conjunctivae normal.  Cardiovascular:     Rate and Rhythm: Normal rate and regular rhythm.     Heart sounds: Normal heart sounds.  Pulmonary:     Effort: Pulmonary effort is normal. No respiratory distress.     Breath sounds: Normal breath sounds.  Abdominal:     Palpations: Abdomen is soft.     Tenderness: There is no abdominal tenderness.  Musculoskeletal:     Cervical back: Normal range of motion and neck supple.  Skin:    General: Skin is warm and dry.  Neurological:     Mental Status: He is alert.  Psychiatric:        Behavior: Behavior normal.     ED Results / Procedures / Treatments   Labs (all labs ordered are listed, but only abnormal results are displayed) Labs Reviewed  BASIC METABOLIC PANEL - Abnormal; Notable for the following components:      Result Value   Sodium 126 (*)    Potassium 5.3 (*)    Chloride 92 (*)    CO2 16 (*)    Glucose, Bld 725 (*)    BUN 25 (*)    Creatinine, Ser 1.74 (*)    GFR calc non Af Amer 45 (*)    GFR calc Af Amer 52 (*)    Anion gap 18 (*)    All other components within normal limits  URINALYSIS, ROUTINE W REFLEX MICROSCOPIC - Abnormal; Notable for the following components:   Color, Urine COLORLESS (*)    Glucose, UA >=500 (*)    Ketones, ur 80 (*)    Leukocytes,Ua MODERATE (*)    Bacteria, UA FEW (*)    All other components within normal limits  BETA-HYDROXYBUTYRIC ACID - Abnormal; Notable for the following components:   Beta-Hydroxybutyric Acid 6.03 (*)    All other components within normal limits  BASIC METABOLIC PANEL - Abnormal; Notable for the following components:   Sodium 134 (*)    CO2 16 (*)    Glucose, Bld 520 (*)    BUN 25 (*)    Creatinine, Ser 1.70 (*)    GFR calc non Af Amer  46 (*)    GFR calc Af Amer 53 (*)    Anion gap 18 (*)    All other components within normal limits  GLUCOSE, CAPILLARY - Abnormal; Notable for the following components:   Glucose-Capillary 374 (*)    All other components within normal  limits  CBG MONITORING, ED - Abnormal; Notable for the following components:   Glucose-Capillary >600 (*)    All other components within normal limits  CBG MONITORING, ED - Abnormal; Notable for the following components:   Glucose-Capillary >600 (*)    All other components within normal limits  POCT I-STAT EG7 - Abnormal; Notable for the following components:   pH, Ven 7.243 (*)    pCO2, Ven 41.0 (*)    pO2, Ven 52.0 (*)    Bicarbonate 17.7 (*)    TCO2 19 (*)    Acid-base deficit 9.0 (*)    Sodium 126 (*)    Potassium 5.8 (*)    All other components within normal limits  CBG MONITORING, ED - Abnormal; Notable for the following components:   Glucose-Capillary >600 (*)    All other components within normal limits  CBG MONITORING, ED - Abnormal; Notable for the following components:   Glucose-Capillary 514 (*)    All other components within normal limits  CBC  BETA-HYDROXYBUTYRIC ACID  HEMOGLOBIN 123XX123  BASIC METABOLIC PANEL  BASIC METABOLIC PANEL  BASIC METABOLIC PANEL  BETA-HYDROXYBUTYRIC ACID  I-STAT VENOUS BLOOD GAS, ED  POC SARS CORONAVIRUS 2 AG -  ED    EKG EKG Interpretation  Date/Time:  Wednesday August 16 2019 19:20:48 EST Ventricular Rate:  94 PR Interval:    QRS Duration: 88 QT Interval:  334 QTC Calculation: 418 R Axis:   17 Text Interpretation: Sinus rhythm No STEMI Confirmed by Nanda Quinton [phone]) on 08/16/2019 8:00:17 PM   Radiology No results found.  Procedures .Critical Care Performed by: Margarita Mail, PA-C Authorized by: Margarita Mail, PA-C   Critical care provider statement:    Critical care time (minutes):  45   Critical care was necessary to treat or prevent imminent or life-threatening deterioration  of the following conditions:  Metabolic crisis   Critical care was time spent personally by me on the following activities:  Discussions with consultants, evaluation of patient's response to treatment, examination of patient, ordering and performing treatments and interventions, ordering and review of laboratory studies, ordering and review of radiographic studies, pulse oximetry, re-evaluation of patient's condition, obtaining history from patient or surrogate and review of old charts   (including critical care time)  Medications Ordered in ED Medications  insulin regular, human (MYXREDLIN) 100 units/ 100 mL infusion (10 Units/hr Intravenous Rate/Dose Change 08/16/19 2247)  0.9 %  sodium chloride infusion ( Intravenous New Bag/Given 08/16/19 2016)  dextrose 5 %-0.45 % sodium chloride infusion (has no administration in time range)  dextrose 50 % solution 0-50 mL (has no administration in time range)  sodium chloride 0.9 % bolus 1,000 mL (1,000 mLs Intravenous Not Given 08/16/19 2220)  enoxaparin (LOVENOX) injection 40 mg (has no administration in time range)    ED Course  I have reviewed the triage vital signs and the nursing notes.  Pertinent labs & imaging results that were available during my care of the patient were reviewed by me and considered in my medical decision making (see chart for details).    MDM Rules/Calculators/A&P                      Is a 49 year old male with a past medical history of diabetes who has been off of his insulin for about 3 months.  He has had slowly elevating blood sugar levels.  Review of the patient's labs show markedly elevated blood glucose level.  Slight elevation in the  patient serum creatinine and BUN.  Anion gap of 18 with ketones in the urine.  Patient does have elevated potassium and decreased sodium level which should clear with fluids and insulin.  Patient's urine appears contaminated.  He has no symptoms of urinary tract infection.  CBC shows no  elevated white blood cell count.  Covid test point-of-care is negative.  Patient placed on fluids, given IV insulin.  He will be admitted to the family medicine service.   Final Clinical Impression(s) / ED Diagnoses Final diagnoses:  Diabetic ketoacidosis without coma associated with type 2 diabetes mellitus [facility])    Rx / DC Orders ED Discharge Orders    None       Margarita Mail, PA-C 08/16/19 2327    Margette Fast, MD 08/17/19 1250

## 2019-08-17 ENCOUNTER — Encounter (HOSPITAL_COMMUNITY): Payer: Self-pay | Admitting: Family Medicine

## 2019-08-17 DIAGNOSIS — N179 Acute kidney failure, unspecified: Secondary | ICD-10-CM | POA: Diagnosis present

## 2019-08-17 DIAGNOSIS — Z79899 Other long term (current) drug therapy: Secondary | ICD-10-CM | POA: Diagnosis not present

## 2019-08-17 DIAGNOSIS — Z880 Allergy status to penicillin: Secondary | ICD-10-CM | POA: Diagnosis not present

## 2019-08-17 DIAGNOSIS — E860 Dehydration: Secondary | ICD-10-CM | POA: Diagnosis present

## 2019-08-17 DIAGNOSIS — Z20822 Contact with and (suspected) exposure to covid-19: Secondary | ICD-10-CM | POA: Diagnosis present

## 2019-08-17 DIAGNOSIS — E1110 Type 2 diabetes mellitus with ketoacidosis without coma: Secondary | ICD-10-CM | POA: Diagnosis present

## 2019-08-17 DIAGNOSIS — E1165 Type 2 diabetes mellitus with hyperglycemia: Secondary | ICD-10-CM | POA: Diagnosis not present

## 2019-08-17 DIAGNOSIS — I5022 Chronic systolic (congestive) heart failure: Secondary | ICD-10-CM | POA: Diagnosis present

## 2019-08-17 DIAGNOSIS — Z7984 Long term (current) use of oral hypoglycemic drugs: Secondary | ICD-10-CM | POA: Diagnosis not present

## 2019-08-17 DIAGNOSIS — I110 Hypertensive heart disease with heart failure: Secondary | ICD-10-CM | POA: Diagnosis present

## 2019-08-17 DIAGNOSIS — E785 Hyperlipidemia, unspecified: Secondary | ICD-10-CM | POA: Diagnosis present

## 2019-08-17 LAB — BASIC METABOLIC PANEL
Anion gap: 12 (ref 5–15)
Anion gap: 10 (ref 5–15)
Anion gap: 9 (ref 5–15)
Anion gap: 11 (ref 5–15)
BUN: 22 mg/dL — ABNORMAL HIGH (ref 6–20)
BUN: 21 mg/dL — ABNORMAL HIGH (ref 6–20)
BUN: 19 mg/dL (ref 6–20)
BUN: 19 mg/dL (ref 6–20)
CO2: 22 mmol/L (ref 22–32)
CO2: 23 mmol/L (ref 22–32)
CO2: 24 mmol/L (ref 22–32)
CO2: 22 mmol/L (ref 22–32)
Calcium: 9.4 mg/dL (ref 8.9–10.3)
Calcium: 9 mg/dL (ref 8.9–10.3)
Calcium: 8.9 mg/dL (ref 8.9–10.3)
Calcium: 8.8 mg/dL — ABNORMAL LOW (ref 8.9–10.3)
Chloride: 103 mmol/L (ref 98–111)
Chloride: 104 mmol/L (ref 98–111)
Chloride: 101 mmol/L (ref 98–111)
Chloride: 102 mmol/L (ref 98–111)
Creatinine, Ser: 1.26 mg/dL — ABNORMAL HIGH (ref 0.61–1.24)
Creatinine, Ser: 1.12 mg/dL (ref 0.61–1.24)
Creatinine, Ser: 1.1 mg/dL (ref 0.61–1.24)
Creatinine, Ser: 1.2 mg/dL (ref 0.61–1.24)
GFR calc Af Amer: >60 mL/min (ref >60)
GFR calc Af Amer: >60 mL/min (ref >60)
GFR calc Af Amer: >60 mL/min (ref >60)
GFR calc Af Amer: >60 mL/min (ref >60)
GFR calc non Af Amer: >60 mL/min (ref >60)
GFR calc non Af Amer: >60 mL/min (ref >60)
GFR calc non Af Amer: >60 mL/min (ref >60)
GFR calc non Af Amer: >60 mL/min (ref >60)
Glucose, Bld: 166 mg/dL — ABNORMAL HIGH (ref 70–99)
Glucose, Bld: 172 mg/dL — ABNORMAL HIGH (ref 70–99)
Glucose, Bld: 189 mg/dL — ABNORMAL HIGH (ref 70–99)
Glucose, Bld: 293 mg/dL — ABNORMAL HIGH (ref 70–99)
Potassium: 4.2 mmol/L (ref 3.5–5.1)
Potassium: 4 mmol/L (ref 3.5–5.1)
Potassium: 4 mmol/L (ref 3.5–5.1)
Potassium: 4.3 mmol/L (ref 3.5–5.1)
Sodium: 137 mmol/L (ref 135–145)
Sodium: 137 mmol/L (ref 135–145)
Sodium: 134 mmol/L — ABNORMAL LOW (ref 135–145)
Sodium: 135 mmol/L (ref 135–145)

## 2019-08-17 LAB — GLUCOSE, CAPILLARY
Glucose-Capillary: 141 mg/dL — ABNORMAL HIGH (ref 70–99)
Glucose-Capillary: 142 mg/dL — ABNORMAL HIGH (ref 70–99)
Glucose-Capillary: 153 mg/dL — ABNORMAL HIGH (ref 70–99)
Glucose-Capillary: 172 mg/dL — ABNORMAL HIGH (ref 70–99)
Glucose-Capillary: 175 mg/dL — ABNORMAL HIGH (ref 70–99)
Glucose-Capillary: 179 mg/dL — ABNORMAL HIGH (ref 70–99)
Glucose-Capillary: 183 mg/dL — ABNORMAL HIGH (ref 70–99)
Glucose-Capillary: 183 mg/dL — ABNORMAL HIGH (ref 70–99)
Glucose-Capillary: 191 mg/dL — ABNORMAL HIGH (ref 70–99)
Glucose-Capillary: 195 mg/dL — ABNORMAL HIGH (ref 70–99)
Glucose-Capillary: 308 mg/dL — ABNORMAL HIGH (ref 70–99)
Glucose-Capillary: 312 mg/dL — ABNORMAL HIGH (ref 70–99)
Glucose-Capillary: 336 mg/dL — ABNORMAL HIGH (ref 70–99)
Glucose-Capillary: 422 mg/dL — ABNORMAL HIGH (ref 70–99)

## 2019-08-17 LAB — BETA-HYDROXYBUTYRIC ACID
Beta-Hydroxybutyric Acid: 2.69 mmol/L — ABNORMAL HIGH (ref 0.05–0.27)
Beta-Hydroxybutyric Acid: 0.88 mmol/L — ABNORMAL HIGH (ref 0.05–0.27)

## 2019-08-17 MED ORDER — CARVEDILOL 25 MG PO TABS
25.0000 mg | ORAL_TABLET | Freq: Two times a day (BID) | ORAL | Status: DC
  Administered 2019-08-17 – 2019-08-18 (×3): 25 mg via ORAL
  Filled 2019-08-17 (×3): qty 1

## 2019-08-17 MED ORDER — ATORVASTATIN CALCIUM 80 MG PO TABS
80.0000 mg | ORAL_TABLET | Freq: Every day | ORAL | Status: DC
  Administered 2019-08-17 – 2019-08-18 (×2): 80 mg via ORAL
  Filled 2019-08-17 (×2): qty 1

## 2019-08-17 MED ORDER — INFLUENZA VAC SPLIT QUAD 0.5 ML IM SUSY: 0.5000 mL | PREFILLED_SYRINGE | INTRAMUSCULAR | Status: DC

## 2019-08-17 MED ORDER — INSULIN ASPART 100 UNIT/ML ~~LOC~~ SOLN
0.0000 [IU] | Freq: Every day | SUBCUTANEOUS | Status: DC
  Administered 2019-08-17: 4 [IU] via SUBCUTANEOUS

## 2019-08-17 MED ORDER — INSULIN GLARGINE 100 UNIT/ML ~~LOC~~ SOLN
10.0000 [IU] | Freq: Every day | SUBCUTANEOUS | Status: DC
  Administered 2019-08-17: 10 [IU] via SUBCUTANEOUS
  Filled 2019-08-17 (×2): qty 0.1

## 2019-08-17 MED ORDER — POTASSIUM CHLORIDE 10 MEQ/100ML IV SOLN
10.0000 meq | INTRAVENOUS | Status: AC
  Administered 2019-08-17 (×2): 10 meq via INTRAVENOUS
  Filled 2019-08-17 (×2): qty 100

## 2019-08-17 MED ORDER — INSULIN ASPART 100 UNIT/ML ~~LOC~~ SOLN
0.0000 [IU] | Freq: Three times a day (TID) | SUBCUTANEOUS | Status: DC
  Administered 2019-08-17: 15 [IU] via SUBCUTANEOUS
  Administered 2019-08-17: 11 [IU] via SUBCUTANEOUS
  Administered 2019-08-18: 15 [IU] via SUBCUTANEOUS
  Administered 2019-08-18: 11 [IU] via SUBCUTANEOUS
  Administered 2019-08-18: 15 [IU] via SUBCUTANEOUS

## 2019-08-17 MED ORDER — INSULIN GLARGINE 100 UNIT/ML ~~LOC~~ SOLN
5.0000 [IU] | Freq: Once | SUBCUTANEOUS | Status: AC
  Administered 2019-08-17: 5 [IU] via SUBCUTANEOUS
  Filled 2019-08-17: qty 0.05

## 2019-08-17 NOTE — Progress Notes (Addendum)
Family Medicine Teaching Service Daily Progress Note Intern Pager: [phone]  Patient name: Nicholas Knight Medical record number: [id number] Date of birth: [birth year] Age: 49 y.o. Gender: male  Primary Care Provider: Cleophas Dunker, DO Consultants: None Code Status: Full Code   Pt Overview and Major Events to Date:  1/13- admitted for DKA, insulin gtt started  1/14: transitioned to subcutaneous insulin  Assessment and Plan: Nicholas Knight is a 49 y.o. male presenting with diabetic ketoacidosis. PMH is significant for type 2 diabetes, hypertension, hyperlipidemia, HFrEF.  DKA with hyperglycemia, type 2 diabetes Patient improved overnight from symptom standpoint, patient denies nausea or dizziness this morning.  Patient would like to eat. Most recent BMP with glucose 183, anion gap 10, beta hydroxybutyrate 2.69 from 7 -Monitor CBGs per protocol -Continue to monitor beta hydroxybutyrate -Continuous cardiac monitoring -Continuous pulse ox -PT/OT  -Discontinue IV fluids -Transition to subcutaneous insulin -Start basal insulin at 10 units as anion gap has been closed for 2 BMP measurements -Continue to monitor BMP  AKI, improving  Cr improved overnight to 1.26 from 1.7.  -monitoring BMP  -patient on NS and D5 with 1/2 NS for DKA protocol   Heart failure reduced ejection fraction Most recent echocardiogram on 02/14/2019 shows ejection fraction of 20-25%. Home medications include Coreg, Lasix, Entresto, Aldactone Weight 86.3kg  -will restart home meds except Entresto and lasix, will consider restart after 24 hours  -Closely monitor patient's fluid status -Fluids as noted above -strict I/Os  -daily weights   Hypertension Blood pressure within normal ranges overnight. -Continue home Entresto, Coreg, spironolactone once patient is tolerating p.o.  Hyperlipidemia Medications include atorvastatin 80 mg -Hold home medications as patient is currently n.p.o.   FEN/GI:  carb/heart healthy  PPx: Lovenox  Disposition: Discharge pending insulin regimen and toleration   Subjective:  Patient reports feeling well, denies headache, dizziness, nausea.  Patient is interested in being started on a diet.  He reports no shortness of breath and feels much improved from admission.   Objective: Temp:  [98 F (36.7 C)-98.6 F (37 C)] 98.4 F (36.9 C) (01/14 0740) Pulse Rate:  [89-120] 95 (01/14 1146) Resp:  [14-25] 15 (01/14 1146) BP: (86-128)/(57-89) 106/69 (01/14 1146) SpO2:  [98 %-100 %] 100 % (01/14 1146) Weight:  [86.2 kg-86.3 kg] 86.3 kg (01/13 2200)  Physical Exam: General: Male appearing stated age lying in bed in no acute distress, non ill looking Cardiovascular: Regular rate and rhythm without friction rubs or gallops, normal S1-S2 Respiratory: Clear to auscultation bilaterally without wheezing or crackles Abdomen: Soft, nontender bowel sounds present throughout Extremities: No lower extremity edema  Laboratory: Recent Labs  Lab 08/16/19 1647 08/16/19 1944  WBC 8.0  --   HGB 15.6 16.0  HCT 46.4 47.0  PLT 315  --    Recent Labs  Lab 08/17/19 0216 08/17/19 0604 08/17/19 1026  NA 137 137 134*  K 4.2 4.0 4.0  CL 103 104 101  CO2 22 23 24   BUN 22* 21* 19  CREATININE 1.26* 1.12 1.10  CALCIUM 9.4 9.0 8.9  GLUCOSE 166* 172* 189*    Imaging/Diagnostic Tests: No results found.   Nicholas Klein, MD 08/17/2019, 11:52 AM PGY-1, [place] Intern pager: [phone], text pages welcome

## 2019-08-17 NOTE — Progress Notes (Signed)
Inpatient Diabetes Program Recommendations  AACE/ADA: New Consensus Statement on Inpatient Glycemic Control   Target Ranges:  Prepandial:   less than 140 mg/dL      Peak postprandial:   less than 180 mg/dL (1-2 hours)      Critically ill patients:  140 - 180 mg/dL   Results for Nicholas Knight, Nicholas Knight (MRN [id number]) as of 08/17/2019 15:10  Ref. Range 08/17/2019 07:40 08/17/2019 09:28 08/17/2019 10:28 08/17/2019 11:38 08/17/2019 12:24  Glucose-Capillary Latest Ref Range: 70 - 99 mg/dL 183 (H) 191 (H) 179 (H) 312 (H) 422 (H)  Results for Nicholas Knight, Nicholas Knight (MRN [id number]) as of 08/17/2019 15:10  Ref. Range 08/17/2019 02:16  Hemoglobin A1C Latest Ref Range: 4.8 - 5.6 % 16.6 (H)   Review of Glycemic Control  Diabetes history: DM2 Outpatient Diabetes medications: Metformin 500 mg daily Current orders for Inpatient glycemic control: Lantus 10 units daily, Novolog 0-15 units TID with meals, Novolog 0-5 units QHS  Inpatient Diabetes Program Recommendations:   Insulin - Basal: Noted patient given Lantus 10 units at 10:19 at transition from IV to SQ insulin and then given another Lantus 5 units at 13:58 today. Please consider increasing Lantus to 26 units daily to start 08/18/19. If agreeable, please given an additional Lantus 11 units x1 now.  Insulin - Meal Coverage: Please consider ordering Novolog 5 units TID with meals for meal coverage if patient eats at least 50% of meals.  HgbA1C: A1C 16.6% on 08/17/19 indicating an average glucose of 430 mg/dl over the past 2-3 months. Patient will need insulin prescribed at time of discharge. Have asked patient to call insurance to see which insulins are preferred.  NOTE: Spoke with patient about diabetes and home regimen for diabetes control. Patient reports being followed by PCP for diabetes management and he has been taking Metformin 500 mg daily for DM control since April 2020. Patient confirms that when he was discharged from the hospital in April 2020, Levemir was  stopped and he has not taken insulin since then. Patient reports that he has been checking glucose daily and it had been running 90-130's mg/dl up until about 2 weeks ago and all of sudden it started running high and sometimes "Hi" on glucometer.  Discussed A1C results (16.6% on 08/17/19 ) and explained that current A1C indicates an average glucose of 430 mg/dl over the past 2-3 months. Discussed glucose and A1C goals. Explained that he will need to start taking insulin again as an outpatient.  Encouraged patient to call his insurance company to ask which insulins are preferred so that the correct insulin can be prescribed at time of discharge.  Encouraged patient to get established with an Endocrinologist for DM management and perhaps to have additional labs to determine if his pancreas is able to make insulin (check c-peptide).  Patient verbalized understanding of information discussed and reports no further questions at this time related to diabetes.  Thanks, Barnie Alderman, RN, MSN, CDE Diabetes Coordinator Inpatient Diabetes Program [phone] (Team Pager)

## 2019-08-18 LAB — GLUCOSE, CAPILLARY
Glucose-Capillary: 315 mg/dL — ABNORMAL HIGH (ref 70–99)
Glucose-Capillary: 461 mg/dL — ABNORMAL HIGH (ref 70–99)
Glucose-Capillary: 376 mg/dL — ABNORMAL HIGH (ref 70–99)
Glucose-Capillary: 371 mg/dL — ABNORMAL HIGH (ref 70–99)

## 2019-08-18 LAB — COMPREHENSIVE METABOLIC PANEL
ALT: 27 U/L (ref 0–44)
AST: 21 U/L (ref 15–41)
Albumin: 2.9 g/dL — ABNORMAL LOW (ref 3.5–5.0)
Alkaline Phosphatase: 50 U/L (ref 38–126)
Anion gap: 10 (ref 5–15)
BUN: 15 mg/dL (ref 6–20)
CO2: 23 mmol/L (ref 22–32)
Calcium: 8.9 mg/dL (ref 8.9–10.3)
Chloride: 103 mmol/L (ref 98–111)
Creatinine, Ser: 1.26 mg/dL — ABNORMAL HIGH (ref 0.61–1.24)
GFR calc Af Amer: >60 mL/min (ref >60)
GFR calc non Af Amer: >60 mL/min (ref >60)
Glucose, Bld: 344 mg/dL — ABNORMAL HIGH (ref 70–99)
Potassium: 4.4 mmol/L (ref 3.5–5.1)
Sodium: 136 mmol/L (ref 135–145)
Total Bilirubin: 1.1 mg/dL (ref 0.3–1.2)
Total Protein: 5.8 g/dL — ABNORMAL LOW (ref 6.5–8.1)

## 2019-08-18 LAB — HEMOGLOBIN A1C
Hgb A1c MFr Bld: >15.5 % — ABNORMAL HIGH (ref 4.8–5.6)
Mean Plasma Glucose: >398 mg/dL

## 2019-08-18 MED ORDER — "PEN NEEDLES 3/16"" 31G X 5 MM MISC": 0 refills | Status: DC

## 2019-08-18 MED ORDER — METFORMIN HCL 500 MG PO TABS: 500.0000 mg | ORAL_TABLET | Freq: Every day | ORAL | Status: DC

## 2019-08-18 MED ORDER — INSULIN GLARGINE 100 UNIT/ML ~~LOC~~ SOLN
30.0000 [IU] | Freq: Every day | SUBCUTANEOUS | Status: DC
  Administered 2019-08-18: 30 [IU] via SUBCUTANEOUS
  Filled 2019-08-18: qty 0.3

## 2019-08-18 MED ORDER — INSULIN GLARGINE 100 UNIT/ML ~~LOC~~ SOLN: 25.0000 [IU] | Freq: Every day | SUBCUTANEOUS | Status: DC

## 2019-08-18 MED ORDER — INSULIN GLARGINE 100 UNIT/ML ~~LOC~~ SOLN
10.0000 [IU] | Freq: Every day | SUBCUTANEOUS | Status: DC
  Filled 2019-08-18: qty 0.1

## 2019-08-18 MED ORDER — SACUBITRIL-VALSARTAN 24-26 MG PO TABS
1.0000 | ORAL_TABLET | Freq: Two times a day (BID) | ORAL | Status: DC
  Administered 2019-08-18: 1 via ORAL
  Filled 2019-08-18 (×2): qty 1

## 2019-08-18 MED ORDER — METFORMIN HCL 500 MG PO TABS
500.0000 mg | ORAL_TABLET | Freq: Every day | ORAL | Status: DC
  Administered 2019-08-18: 500 mg via ORAL
  Filled 2019-08-18: qty 1

## 2019-08-18 MED ORDER — INSULIN DETEMIR 100 UNIT/ML ~~LOC~~ SOLN: 30.0000 [IU] | Freq: Every day | SUBCUTANEOUS | 0 refills | Status: DC

## 2019-08-18 NOTE — Progress Notes (Signed)
Spoke with patient on the phone about his insulin. States that he does not have his insurance card with him, so cannot call the insurance company. States that he will go with the Levemir that he was prescribed last year. He did not take Humalog or Novolog at that time. Will continue to monitor blood sugars while in the hospital. Will follow up to make patient can get the insulin that he needs at discharge.   Harvel Ricks RN BSN CDE Diabetes Coordinator Pager: [phone]  8am-5pm

## 2019-08-18 NOTE — Discharge Instructions (Signed)
You were admitted to the hospital for Diabetic Ketoacidosis. This was treated with insulin and fluids.   New Medication Regimen  1. Please continue to administer insulin therapy at home. You will be prescribed 30 units of insulin at night and continue taking your metformin 500 mg in the morning. Please check your blood glucose prior to eating breakfast and before meals.  It is very important that you keep a log of these numbers and bring these to your follow-up visit on 1/18.  We will continue to titrate up your medicines so that you can have better diabetes control.   3. Your primary care doctor may suggest starting an oral medication for your diabetes as well so please make sure to follow up for your hospital follow up visit.   We are holding your Lasix and Sprionolactone for now as we give your kidneys a chance to normalize. Please discuss re-starting these medications at your follow up appointment after lab work to check your kidneys.   If you start experiencing worsening lethargy, frequent vomiting, severe abdominal pain>> please seek medical care right away.   Diabetic Ketoacidosis Diabetic ketoacidosis is a serious complication of diabetes. This condition develops when there is not enough insulin in the body. Insulin is an hormone that regulates blood sugar levels in the body. Normally, insulin allows glucose to enter the cells in the body. The cells break down glucose for energy. Without enough insulin, the body cannot break down glucose, so it breaks down fats instead. This leads to high blood glucose levels in the body and the production of acids that are called ketones. Ketones are poisonous at high levels. If diabetic ketoacidosis is not treated, it can cause severe dehydration and can lead to a coma or death. What are the causes? This condition develops when a lack of insulin causes the body to break down fats instead of glucose. This may be triggered by:  Stress on the body. This  stress is brought on by an illness.  Infection.  Medicines that raise blood glucose levels.  Not taking diabetes medicine.  New onset of type 1 diabetes mellitus. What are the signs or symptoms? Symptoms of this condition include:  Fatigue.  Weight loss.  Excessive thirst.  Light-headedness.  Fruity or sweet-smelling breath.  Excessive urination.  Vision changes.  Confusion or irritability.  Nausea.  Vomiting.  Rapid breathing.  Abdominal pain.  Feeling flushed. How is this diagnosed? This condition is diagnosed based on your medical history, a physical exam, and blood tests. You may also have a urine test to check for ketones. How is this treated? This condition may be treated with:  Fluid replacement. This may be done to correct dehydration.  Insulin injections. These may be given through the skin or through an IV tube.  Electrolyte replacement. Electrolytes are minerals in your blood. Electrolytes such as potassium and sodium may be given in pill form or through an IV tube.  Antibiotic medicines. These may be prescribed if your condition was caused by an infection. Diabetic ketoacidosis is a serious medical condition. You may need emergency treatment in the hospital to monitor your condition. Follow these instructions at home: Eating and drinking  Drink enough fluids to keep your urine clear or pale yellow.  If you are not able to eat, drink clear fluids in small amounts as you are able. Clear fluids include water, ice chips, fruit juice with water added (diluted), and low-calorie sports drinks. You may also have sugar-free jello or  popsicles.  If you are able to eat, follow your usual diet and drink sugar-free liquids, such as water. Medicines  Take over-the-counter and prescription medicines only as told by your health care provider.  Continue to take insulin and other diabetes medicines as told by your health care provider.  If you were prescribed  an antibiotic, take it as told by your health care provider. Do not stop taking the antibiotic even if you start to feel better. General instructions   Check your urine for ketones when you are ill and as told by your health care provider. ? If your blood glucose is 240 mg/dL (13.3 mmol/L) or higher, check your urine ketones every 4-6 hours.  Check your blood glucose every day, as often as told by your health care provider. ? If your blood glucose is high, drink plenty of fluids. This helps to flush out ketones. ? If your blood glucose is above your target for 2 tests in a row, contact your health care provider.  Carry a medical alert card or wear medical alert jewelry that says that you have diabetes.  Rest and exercise only as told by your health care provider. Do not exercise when your blood glucose is high and you have ketones in your urine.  If you get sick, call your health care provider and begin treatment quickly. Your body often needs extra insulin to fight an illness. Check your blood glucose every 4-6 hours when you are sick.  Keep all follow-up visits as told by your health care provider. This is important. Contact a health care provider if:  Your blood glucose level is higher than 240 mg/dL (13.3 mmol/L) for 2 days in a row.  You have moderate or large ketones in your urine.  You have a fever.  You cannot eat or drink without vomiting.  You have been vomiting for more than 2 hours.  You continue to have symptoms of diabetic ketoacidosis.  You develop new symptoms. Get help right away if:  Your blood glucose monitor reads "high" even when you are taking insulin.  You faint.  You have chest pain.  You have trouble breathing.  You have sudden trouble speaking or swallowing.  You have vomiting or diarrhea that gets worse after 3 hours.  You are unable to stay awake.  You have trouble thinking.  You are severely dehydrated. Symptoms of severe dehydration  include: ? Extreme thirst. ? Dry mouth. ? Rapid breathing. These symptoms may represent a serious problem that is an emergency. Do not wait to see if the symptoms will go away. Get medical help right away. Call your local emergency services (911 in the [place] Do not drive yourself to the hospital. Summary  Diabetic ketoacidosis is a serious complication of diabetes. This condition develops when there is not enough insulin in the body.  This condition is diagnosed based on your medical history, a physical exam, and blood tests. You may also have a urine test to check for ketones.  Diabetic ketoacidosis is a serious medical condition. You may need emergency treatment in the hospital to monitor your condition.  Contact your health care provider if your blood glucose is higher than 240 mg/dl for 2 days in a row or if you have moderate or large ketones in your urine. This information is not intended to replace advice given to you by your health care provider. Make sure you discuss any questions you have with your health care provider. Document Revised: 09/04/2016 Document Reviewed:  08/24/2016 Elsevier Patient Education  [employer].

## 2019-08-18 NOTE — Progress Notes (Signed)
CRITICAL VALUE ALERT  Critical Value:  461  Date & Time Notied:  08/18/2019 1222  Provider Notified: Dr. Rosita Fire  Orders Received/Actions taken: Give 15 units novolog at this time.     Will continue to monitor

## 2019-08-18 NOTE — Progress Notes (Addendum)
Family Medicine Teaching Service Daily Progress Note Intern Pager: [phone]  Patient name: Nicholas Knight Medical record number: [id number] Date of birth: February 09, 1971 Age: 49 y.o. Gender: male  Primary Care Provider: Cleophas Dunker, DO Consultants: None Code Status: Full Code   Pt Overview and Major Events to Date:  1/13- admitted for DKA, insulin gtt started  1/14: transitioned to subcutaneous insulin  Assessment and Plan: Nicholas Knight is a 49 y.o. male presenting with diabetic ketoacidosis, now resolved and undergoing management of hyperglycemia. PMH is significant for type 2 diabetes, hypertension, hyperlipidemia, HFrEF.  DKA with hyperglycemia(resolved), type 2 diabetes, hyperglycemia  Patient transitioned to subcutaneous insulin on 08/17/19. Patient continued to have hyper glycemia. CBG ranged from 308-422 overnight, 315 today. Patient received 30 units of FA insulin and 15 units Lantus yesterday. Patient will discharge on insulin therapy. Will continue to titrate. Patient continues to be asymptomatic and no hypoglycemia.  Most recent CMP with K 4.4 and Cr 1.2 from 1.0. -Monitor CBGs per protocol -Continue to monitor beta hydroxybutyrate -Continuous cardiac monitoring -Continuous pulse ox -PT/OT  -Increase basal insulin from 10 units to 25 units  -continue sliding scale at moderate  -Continue to monitor BMP -will restart lantus 30 units after discharge   AKI, increasing  Cr up to 1.2 From 1.0  -monitoring BMP  -IVF fluids discontinued for HFrEF precaution  -holding Entresto and Lasix  -  Heart failure reduced ejection fraction Most recent echocardiogram on 02/14/2019 shows ejection fraction of 20-25%. Home medications include Coreg, Lasix, Entresto, Aldactone Last Weight 86.3kg  Net + 1 liter since admission On exam, patient appears to not be volume overloaded.  -continue home Coreg -restart Entresto prior to discharge  -hold lasix while recovering from  AKI -Closely monitor patient's fluid status -Fluids as noted above -strict I/Os  -daily weights   Hypertension Blood pressure continue to be within normal ranges overnight. -Vitals per floor protocol  -will restart Entresto if Cr has continued to improve  -hold  lasix & spironolactone    Hyperlipidemia Medications include atorvastatin 80 mg -continue home atorvastatin  FEN/GI: carb/heart healthy  PPx: Lovenox  Disposition: Discharge pending insulin regimen and toleration   Subjective:  Patient reports no nausea, GI upset of HA. Feeling well and is ready for discharge.      Objective: Temp:  [98.3 F (36.8 C)-98.7 F (37.1 C)] 98.7 F (37.1 C) (01/15 1211) Pulse Rate:  [56-98] 95 (01/15 1211) Resp:  [13-28] 16 (01/15 1211) BP: (110-119)/(70-81) 110/70 (01/15 1211) SpO2:  [98 %-100 %] 98 % (01/15 1211)  Physical Exam: General: male appearing stated age lying in bed in NAD  Cardiovascular: RRR without murmurs or friction rub Respiratory: CTAB without wheezing or crackles, stable on RA  Abdomen: soft, NT, bowel sounds present  Extremities: No LE edema   Laboratory: Recent Labs  Lab 08/16/19 1647 08/16/19 1944  WBC 8.0  --   HGB 15.6 16.0  HCT 46.4 47.0  PLT 315  --    Recent Labs  Lab 08/17/19 1026 08/17/19 1459 08/18/19 0617  NA 134* 135 136  K 4.0 4.3 4.4  CL 101 102 103  CO2 24 22 23   BUN 19 19 15   CREATININE 1.10 1.20 1.26*  CALCIUM 8.9 8.8* 8.9  PROT  --   --  5.8*  BILITOT  --   --  1.1  ALKPHOS  --   --  50  ALT  --   --  27  AST  --   --  21  GLUCOSE 189* 293* 344*    Imaging/Diagnostic Tests: No results found.   [name], MD 08/18/2019, 2:03 PM PGY-1, [place] Intern pager: [phone], text pages welcome

## 2019-08-18 NOTE — TOC Benefit Eligibility Note (Signed)
Transition of Care [facility]) Benefit Eligibility Note    Patient Details  Name: Nicholas Knight MRN: [id number] Date of Birth: 1971/02/23   Medication/Dose: Ernest Mallick  Covered?: Yes  Tier: 3 Drug  Prescription Coverage Preferred Pharmacy: [facility]    AND [facility]  Spoke with Person/Company/Phone Number:: PALO   @  [employer] #  [id number]  Co-Pay: $ 50.00 PEN    /   VIAL; $45.00     Deductible: (OUT-OF - POCKET : NOT MET)  Additional Notes: HUMALOY  $50.00 PEN  / VIAL $45.00    , NOVOLOG   PEN $ 50.00   / VIAL $45.00    Memory Argue Phone Number: 08/18/2019, 12:24 PM

## 2019-08-18 NOTE — Progress Notes (Signed)
IV and telemetry discontinued. Discharge instructions reviewed with patient. All questions answered. Dr. Rosita Fire notified of patients BG of 371. Ok to discharge.

## 2019-08-18 NOTE — Plan of Care (Signed)
Continue to monitor

## 2019-08-21 ENCOUNTER — Other Ambulatory Visit: Payer: Self-pay

## 2019-08-21 ENCOUNTER — Ambulatory Visit (INDEPENDENT_AMBULATORY_CARE_PROVIDER_SITE_OTHER): Payer: PRIVATE HEALTH INSURANCE | Admitting: Student in an Organized Health Care Education/Training Program

## 2019-08-21 VITALS — BP 110/68 | HR 98 | Wt 197.6 lb

## 2019-08-21 DIAGNOSIS — E1169 Type 2 diabetes mellitus with other specified complication: Secondary | ICD-10-CM | POA: Diagnosis not present

## 2019-08-21 DIAGNOSIS — Z23 Encounter for immunization: Secondary | ICD-10-CM

## 2019-08-21 DIAGNOSIS — E669 Obesity, unspecified: Secondary | ICD-10-CM | POA: Diagnosis not present

## 2019-08-21 DIAGNOSIS — E1110 Type 2 diabetes mellitus with ketoacidosis without coma: Secondary | ICD-10-CM

## 2019-08-21 LAB — GLUCOSE, POCT (MANUAL RESULT ENTRY): POC Glucose: 220 mg/dl — AB (ref 70–99)

## 2019-08-21 NOTE — Patient Instructions (Signed)
It was a pleasure to see you today!  To summarize our discussion for this visit:  I am glad to hear that you are feeling better from your hospitalization.  Today, we discussed different options for getting your blood sugars under better control as they are still higher than we would like to see your goal be.  Please increase your Metformin to 500 mg twice per day.  You may experience some stomach upset from this increase but that should resolve after your body adjusts to the new dose.  We will also increase your insulin dose to 33 units per night.  This dose may need to be further adjusted so please make an appointment by the end of the week to discuss with the provider your blood sugars.  We also discussed the addition of other medications that can help control your blood sugars but you declined this today so we can further that discussion in the future if you wish.  We will check your kidney function today in blood work.  Some additional health maintenance measures we should update are: Health Maintenance Due  Topic Date Due  . OPHTHALMOLOGY EXAM  02/28/1979  . URINE MICROALBUMIN  02/28/1979  . TETANUS/TDAP  02/28/1988  . FOOT EXAM  11/03/2016  . COLONOSCOPY  02/28/2019  . INFLUENZA VACCINE  03/04/2019  .    Please return to our clinic to see [place] by the end of the week.  Call the clinic at [phone] if your symptoms worsen or you have any concerns.   Thank you for allowing me to take part in your care,  Dr. Doristine Mango   Sitagliptin oral tablet What is this medicine? SITAGLIPTIN (sit a GLIP tin) helps to treat type 2 diabetes. It helps to control blood sugar. Treatment is combined with diet and exercise. This medicine may be used for other purposes; ask your health care provider or pharmacist if you have questions. COMMON BRAND NAME(S): Januvia What should I tell my health care provider before I take this medicine? They need to know if you have any of these  conditions:  diabetic ketoacidosis  kidney disease  pancreatitis  previous swelling of the tongue, face, or lips with difficulty breathing, difficulty swallowing, hoarseness, or tightening of the throat  type 1 diabetes  an unusual or allergic reaction to sitagliptin, other medicines, foods, dyes, or preservatives  pregnant or trying to get pregnant  breast-feeding How should I use this medicine? Take this medicine by mouth with a glass of water. Follow the directions on the prescription label. You can take it with or without food. Do not cut, crush or chew this medicine. Take your dose at the same time each day. Do not take more often than directed. Do not stop taking except on your doctor's advice. A special MedGuide will be given to you by the pharmacist with each prescription and refill. Be sure to read this information carefully each time. Talk to your pediatrician regarding the use of this medicine in children. Special care may be needed. Overdosage: If you think you have taken too much of this medicine contact a poison control center or emergency room at once. NOTE: This medicine is only for you. Do not share this medicine with others. What if I miss a dose? If you miss a dose, take it as soon as you can. If it is almost time for your next dose, take only that dose. Do not take double or extra doses. What may interact with this medicine?  Do not take this medicine with any of the following medications:  gatifloxacin This medicine may also interact with the following medications:  alcohol  digoxin  insulin  sulfonylureas like glimepiride, glipizide, glyburide This list may not describe all possible interactions. Give your health care provider a list of all the medicines, herbs, non-prescription drugs, or dietary supplements you use. Also tell them if you smoke, drink alcohol, or use illegal drugs. Some items may interact with your medicine. What should I watch for while  using this medicine? Visit your doctor or health care professional for regular checks on your progress. A test called the HbA1C (A1C) will be monitored. This is a simple blood test. It measures your blood sugar control over the last 2 to 3 months. You will receive this test every 3 to 6 months. Learn how to check your blood sugar. Learn the symptoms of low and high blood sugar and how to manage them. Always carry a quick-source of sugar with you in case you have symptoms of low blood sugar. Examples include hard sugar candy or glucose tablets. Make sure others know that you can choke if you eat or drink when you develop serious symptoms of low blood sugar, such as seizures or unconsciousness. They must get medical help at once. Tell your doctor or health care professional if you have high blood sugar. You might need to change the dose of your medicine. If you are sick or exercising more than usual, you might need to change the dose of your medicine. Do not skip meals. Ask your doctor or health care professional if you should avoid alcohol. Many nonprescription cough and cold products contain sugar or alcohol. These can affect blood sugar. Wear a medical ID bracelet or chain, and carry a card that describes your disease and details of your medicine and dosage times. What side effects may I notice from receiving this medicine? Side effects that you should report to your doctor or health care professional as soon as possible:  allergic reactions like skin rash, itching or hives, swelling of the face, lips, or tongue  breathing problems  general ill feeling or flu-like symptoms  joint pain  loss of appetite  redness, blistering, peeling or loosening of the skin, including inside the mouth  signs and symptoms of heart failure like breathing problems, fast, irregular heartbeat, sudden weight gain; swelling of the ankles, feet, hands; unusually weak or tired  signs and symptoms of low blood sugar  such as feeling anxious, confusion, dizziness, increased hunger, unusually weak or tired, sweating, shakiness, cold, irritable, headache, blurred vision, fast heartbeat, loss of consciousness  signs and symptoms of muscle injury like dark urine; trouble passing urine or change in the amount of urine; unusually weak or tired; muscle pain; back pain  unusual stomach upset or pain  vomiting Side effects that usually do not require medical attention (report to your doctor or health care professional if they continue or are bothersome):  diarrhea  headache  sore throat  stomach upset  stuffy or runny nose This list may not describe all possible side effects. Call your doctor for medical advice about side effects. You may report side effects to FDA at 1-800-FDA-1088. Where should I keep my medicine? Keep out of the reach of children. Store at room temperature between 15 and 30 degrees C (59 and 86 degrees F). Throw away any unused medicine after the expiration date. NOTE: This sheet is a summary. It may not cover all possible information.  If you have questions about this medicine, talk to your doctor, pharmacist, or health care provider.  2020 Elsevier/Gold Standard (2018-02-22 16:38:43)  Empagliflozin oral tablets What is this medicine? EMPAGLIFLOZIN (EM pa gli FLOE zin) helps to treat type 2 diabetes. It helps to control blood sugar. This drug may also reduce the risk of heart attack or stroke if you have type 2 diabetes and risk factors for heart disease. Treatment is combined with diet and exercise. This medicine may be used for other purposes; ask your health care provider or pharmacist if you have questions. COMMON BRAND NAME(S): Jardiance What should I tell my health care provider before I take this medicine? They need to know if you have any of these conditions:  dehydration  diabetic ketoacidosis  diet low in salt  eating less due to illness, surgery, dieting, or any other  reason  having surgery  high cholesterol  high levels of potassium in the blood  history of pancreatitis or pancreas problems  history of yeast infection of the penis or vagina  if you often drink alcohol  infections in the bladder, kidneys, or urinary tract  kidney disease  liver disease  low blood pressure  on hemodialysis  problems urinating  type 1 diabetes  uncircumcised male  an unusual or allergic reaction to empagliflozin, other medicines, foods, dyes, or preservatives  pregnant or trying to get pregnant  breast-feeding How should I use this medicine? Take this medicine by mouth with a glass of water. Follow the directions on the prescription label. Take it in the morning, with or without food. Take your dose at the same time each day. Do not take more often than directed. Do not stop taking except on your doctor's advice. Talk to your pediatrician regarding the use of this medicine in children. Special care may be needed. Overdosage: If you think you have taken too much of this medicine contact a poison control center or emergency room at once. NOTE: This medicine is only for you. Do not share this medicine with others. What if I miss a dose? If you miss a dose, take it as soon as you can. If it is almost time for your next dose, take only that dose. Do not take double or extra doses. What may interact with this medicine? Do not take this medicine with any of the following medications:  gatifloxacin This medicine may also interact with the following medications:  alcohol  certain medicines for blood pressure, heart disease  diuretics This list may not describe all possible interactions. Give your health care provider a list of all the medicines, herbs, non-prescription drugs, or dietary supplements you use. Also tell them if you smoke, drink alcohol, or use illegal drugs. Some items may interact with your medicine. What should I watch for while using  this medicine? Visit your doctor or health care professional for regular checks on your progress. This medicine can cause a serious condition in which there is too much acid in the blood. If you develop nausea, vomiting, stomach pain, unusual tiredness, or breathing problems, stop taking this medicine and call your doctor right away. If possible, use a ketone dipstick to check for ketones in your urine. A test called the HbA1C (A1C) will be monitored. This is a simple blood test. It measures your blood sugar control over the last 2 to 3 months. You will receive this test every 3 to 6 months. Learn how to check your blood sugar. Learn the symptoms of low  and high blood sugar and how to manage them. Always carry a quick-source of sugar with you in case you have symptoms of low blood sugar. Examples include hard sugar candy or glucose tablets. Make sure others know that you can choke if you eat or drink when you develop serious symptoms of low blood sugar, such as seizures or unconsciousness. They must get medical help at once. Tell your doctor or health care professional if you have high blood sugar. You might need to change the dose of your medicine. If you are sick or exercising more than usual, you might need to change the dose of your medicine. Do not skip meals. Ask your doctor or health care professional if you should avoid alcohol. Many nonprescription cough and cold products contain sugar or alcohol. These can affect blood sugar. Wear a medical ID bracelet or chain, and carry a card that describes your disease and details of your medicine and dosage times. What side effects may I notice from receiving this medicine? Side effects that you should report to your doctor or health care professional as soon as possible:  allergic reactions like skin rash, itching or hives, swelling of the face, lips, or tongue  breathing problems  dizziness  feeling faint or lightheaded, falls  muscle  weakness  nausea, vomiting, unusual stomach upset or pain  penile discharge, itching, or pain in men  signs and symptoms of a genital infection, such as fever; tenderness, redness, or swelling in the genitals or area from the genitals to the back of the rectum  signs and symptoms of low blood sugar such as feeling anxious, confusion, dizziness, increased hunger, unusually weak or tired, sweating, shakiness, cold, irritable, headache, blurred vision, fast heartbeat, loss of consciousness  signs and symptoms of a urinary tract infection, such as fever, chills, a burning feeling when urinating, blood in the urine, back pain  trouble passing urine or change in the amount of urine, including an urgent need to urinate more often, in larger amounts, or at night  unusual tiredness  vaginal discharge, itching, or odor in women Side effects that usually do not require medical attention (report to your doctor or health care professional if they continue or are bothersome):  mild increase in urination  thirsty This list may not describe all possible side effects. Call your doctor for medical advice about side effects. You may report side effects to FDA at 1-800-FDA-1088. Where should I keep my medicine? Keep out of the reach of children. Store at room temperature between 20 and 25 degrees C (68 and 77 degrees F). Throw away any unused medicine after the expiration date. NOTE: This sheet is a summary. It may not cover all possible information. If you have questions about this medicine, talk to your doctor, pharmacist, or health care provider.  2020 Elsevier/Gold Standard (2017-04-01 10:25:34)

## 2019-08-21 NOTE — Progress Notes (Signed)
   Subjective:    Patient ID: Cristal Kinzer, male    DOB: 1971-06-11, 49 y.o.   MRN: [id number]  CC: hosp f/u from DKA  HPI:  Patient states that he has been doing well since discharge. No more N/V, abdominal pain etc. He has been compliant with his metformin 500mg  daily and 30 units levemir. Fasting blood sugars at home have continued to be in 200s or low 300s.   Smoking status reviewed   ROS: pertinent noted in the HPI   I have personally reviewed pertinent past medical history, surgical, family, and social history as appropriate. Objective:  BP 110/68   Pulse 98   Wt 197 lb 9.6 oz (89.6 kg)   SpO2 97%   BMI 29.18 kg/m   Vitals and nursing note reviewed  General: NAD, pleasant, able to participate in exam Extremities: no edema or cyanosis. Skin: warm and dry, no rashes noted Neuro: alert, no obvious focal deficits Psych: Normal affect and mood  Assessment & Plan:   DKA (diabetic ketoacidoses) ([place]) Patient asymptomatic and tolerating treatment well but has poorly controlled blood sugars still. 220 at time of visit. Discussed with patient options of increasing metformin, increasing insulin, and adding on another agent such as GLP-1 or SGLT-2.  We discussed in detail benefits/pitfalls of all options.  Patient decided to increase insulin and metformin.  - metformin 500mg  BID.- discussed possible temporary GI upset - insulin to 33units per night.  - continue to check blood sugars and follow up later in the week for medication changes. Consider changing to morning insulin dosing - provided patient with medication information on other oral agents to consider  Orders Placed This Encounter  Procedures  . Flu Vaccine QUAD 36+ mos IM  . Basic Metabolic Panel  . Glucose (CBG)    Doristine Mango, [place] PGY-2

## 2019-08-22 LAB — BASIC METABOLIC PANEL
BUN/Creatinine Ratio: 22 — ABNORMAL HIGH (ref 9–20)
BUN: 22 mg/dL (ref 6–24)
CO2: 24 mmol/L (ref 20–29)
Calcium: 9.2 mg/dL (ref 8.7–10.2)
Chloride: 101 mmol/L (ref 96–106)
Creatinine, Ser: 0.99 mg/dL (ref 0.76–1.27)
GFR calc Af Amer: 102 mL/min/{1.73_m2} (ref >59)
GFR calc non Af Amer: 88 mL/min/{1.73_m2} (ref >59)
Glucose: 207 mg/dL — ABNORMAL HIGH (ref 65–99)
Potassium: 4.6 mmol/L (ref 3.5–5.2)
Sodium: 138 mmol/L (ref 134–144)

## 2019-08-23 NOTE — Assessment & Plan Note (Addendum)
Patient asymptomatic and tolerating treatment well but has poorly controlled blood sugars still. 220 at time of visit. Discussed with patient options of increasing metformin, increasing insulin, and adding on another agent such as GLP-1 or SGLT-2.  We discussed in detail benefits/pitfalls of all options.  Patient decided to increase insulin and metformin.  - metformin 500mg  BID.- discussed possible temporary GI upset - insulin to 33units per night.  - continue to check blood sugars and follow up later in the week for medication changes. Consider changing to morning insulin dosing - provided patient with medication information on other oral agents to consider

## 2019-08-25 ENCOUNTER — Other Ambulatory Visit: Payer: Self-pay

## 2019-08-25 ENCOUNTER — Telehealth (INDEPENDENT_AMBULATORY_CARE_PROVIDER_SITE_OTHER): Payer: PRIVATE HEALTH INSURANCE | Admitting: Family Medicine

## 2019-08-25 DIAGNOSIS — E669 Obesity, unspecified: Secondary | ICD-10-CM | POA: Diagnosis not present

## 2019-08-25 DIAGNOSIS — E1165 Type 2 diabetes mellitus with hyperglycemia: Secondary | ICD-10-CM | POA: Diagnosis not present

## 2019-08-25 DIAGNOSIS — I5020 Unspecified systolic (congestive) heart failure: Secondary | ICD-10-CM | POA: Diagnosis not present

## 2019-08-25 DIAGNOSIS — E1169 Type 2 diabetes mellitus with other specified complication: Secondary | ICD-10-CM | POA: Diagnosis not present

## 2019-08-25 MED ORDER — METFORMIN HCL 500 MG PO TABS: 1000.0000 mg | ORAL_TABLET | Freq: Every day | ORAL | 11 refills | Status: DC

## 2019-08-25 MED ORDER — LIRAGLUTIDE 18 MG/3ML ~~LOC~~ SOPN: 0.6000 mg | PEN_INJECTOR | Freq: Every day | SUBCUTANEOUS | 3 refills | Status: DC

## 2019-08-25 MED ORDER — FUROSEMIDE 20 MG PO TABS: 20.0000 mg | ORAL_TABLET | Freq: Every day | ORAL | 3 refills | Status: DC

## 2019-08-25 NOTE — Assessment & Plan Note (Signed)
Not controlled.  Will increase metformin to 1000mg  BID and start GLP-1 victoza.  Patient advised to take 0.6mg  x 1 week, then increase to 1.2mg .  Will return in 2-4 weeks and recheck BMP.  He denies family history of medullary thyroid cancer.  Advised of hypoglycemia symptoms and to seek medical attention if this occurs.  He voiced understanding.  In the next few months, could also consider adding SGLT-2 and try to decrease insulin usage, as would also help with HFrEF.  F/U 2-4 weeks.

## 2019-08-25 NOTE — Progress Notes (Signed)
[place] Telemedicine Visit  Patient consented to have virtual visit. Method of visit: Video  Encounter participants: Patient: Nicholas Knight - located at home Provider: Cleophas Dunker - located at [facility] Others (if applicable): none  Chief Complaint: Diabetes F/u  HPI:  Nicholas Knight is a 49 y.o. male who presents to discuss the following:  Diabetes f/u Recently admitted to hospital for DKA Seen by Dr. Ouida Sills on 1/18 She discussed alternative DM treatments at that time Increased insulin from 30 to 33u daily at that time CBGs are still in the 200s Fasting was 221 this AM, after eating went up to [place] is a little blurred, but otherwise feels fine No polyuria, polydipsia, vomiting Metformin 500mg  BID Levemir 33u QHS Reports compliance with his medications Denies chest pain or shortness of breath  HFrEF No chest pain or shortness of breath, hasn't noticed new edema Lasix and Spironolactone were held on d/c due to AKI and restarting multiple neprhotoxic medications Last Cr recheck was 0.99 on 1/18  ROS: per HPI  Pertinent PMHx: T2DM, HFrEF  Exam:  Respiratory: Speaking in complete sentences, no evidence of respiratory distress  Assessment/Plan:  Heart failure with reduced ejection fraction ([place]) Last EF 20-25% in July 2020.  Cr was at baseline on recheck.  Given significantly reduced EF, will restart previous Lasix at 20mg  QD.  Patient notes he had good UOP on this.  Will hold off on restarting spironolactone now and plan to restart at f/u visit in 2-4 weeks pending stable Cr and K WNL.  Diabetes mellitus type 2 in obese [facility]) Not controlled.  Will increase metformin to 1000mg  BID and start GLP-1 victoza.  Patient advised to take 0.6mg  x 1 week, then increase to 1.2mg .  Will return in 2-4 weeks and recheck BMP.  He denies family history of medullary thyroid cancer.  Advised of hypoglycemia symptoms and to seek medical attention if  this occurs.  He voiced understanding.  In the next few months, could also consider adding SGLT-2 and try to decrease insulin usage, as would also help with HFrEF.  F/U 2-4 weeks.    Time spent during visit with patient: 13 minutes

## 2019-08-25 NOTE — Assessment & Plan Note (Signed)
Last EF 20-25% in July 2020.  Cr was at baseline on recheck.  Given significantly reduced EF, will restart previous Lasix at 20mg  QD.  Patient notes he had good UOP on this.  Will hold off on restarting spironolactone now and plan to restart at f/u visit in 2-4 weeks pending stable Cr and K WNL.

## 2019-08-28 ENCOUNTER — Telehealth: Payer: Self-pay | Admitting: *Deleted

## 2019-08-28 NOTE — Telephone Encounter (Signed)
Received fax stating that pts rx for victoza is not covered by insurance plan.Nicholas Knight, CMA

## 2019-08-29 NOTE — Telephone Encounter (Signed)
I checked my inbox and didn't see the fax.  Did they say what medications they would cover?  The only thing it says for his insurance coverage is generic commercial.  Thanks!

## 2019-08-30 NOTE — Telephone Encounter (Signed)
Called pharmacy and she said that they did not give them any options as to what was covered. She said it just said that the Victoza was not covered. I can try and locate his insurance card and contact them to see what may be covered if you would like.Nicholas Knight, CMA

## 2019-08-31 NOTE — Telephone Encounter (Signed)
That would be great!  Thank you so much!

## 2019-09-01 ENCOUNTER — Other Ambulatory Visit: Payer: Self-pay | Admitting: Family Medicine

## 2019-09-01 DIAGNOSIS — E1169 Type 2 diabetes mellitus with other specified complication: Secondary | ICD-10-CM

## 2019-09-01 MED ORDER — OZEMPIC (0.25 OR 0.5 MG/DOSE) 2 MG/1.5ML ~~LOC~~ SOPN: 0.2500 mg | PEN_INJECTOR | SUBCUTANEOUS | 2 refills | Status: DC

## 2019-09-01 NOTE — Progress Notes (Signed)
Rx sent for ozempic, as victoza not covered.

## 2019-09-01 NOTE — Telephone Encounter (Signed)
Contacted pt pharmacy and she said that Coleta is the only one that his insurance will not cover.  She said in other medicine in that class should work said there may be some that need prior auth but she gave me a list of ones she believes would be covered without prior auth. THe ones she gave me are 1. Bydureon, 2. Byetta, 3. Ozempic, and 4. Trulicity. Bonetta Mostek Zimmerman Rumple, CMA

## 2019-09-01 NOTE — Telephone Encounter (Signed)
Rx sent for ozempic.

## 2019-09-11 ENCOUNTER — Other Ambulatory Visit (HOSPITAL_COMMUNITY): Payer: Self-pay | Admitting: Internal Medicine

## 2019-10-02 ENCOUNTER — Other Ambulatory Visit: Payer: Self-pay | Admitting: Family Medicine

## 2019-10-19 ENCOUNTER — Other Ambulatory Visit: Payer: Self-pay | Admitting: Family Medicine

## 2019-10-20 ENCOUNTER — Ambulatory Visit: Payer: PRIVATE HEALTH INSURANCE | Admitting: Cardiology

## 2019-11-09 ENCOUNTER — Ambulatory Visit (INDEPENDENT_AMBULATORY_CARE_PROVIDER_SITE_OTHER): Payer: PRIVATE HEALTH INSURANCE | Admitting: Cardiology

## 2019-11-09 ENCOUNTER — Other Ambulatory Visit: Payer: Self-pay

## 2019-11-09 ENCOUNTER — Encounter: Payer: Self-pay | Admitting: Cardiology

## 2019-11-09 VITALS — BP 140/100 | HR 98 | Ht 69.0 in | Wt 204.0 lb

## 2019-11-09 DIAGNOSIS — I10 Essential (primary) hypertension: Secondary | ICD-10-CM | POA: Diagnosis not present

## 2019-11-09 DIAGNOSIS — I5022 Chronic systolic (congestive) heart failure: Secondary | ICD-10-CM | POA: Diagnosis not present

## 2019-11-09 MED ORDER — SACUBITRIL-VALSARTAN 49-51 MG PO TABS: 1.0000 | ORAL_TABLET | Freq: Two times a day (BID) | ORAL | 11 refills | Status: DC

## 2019-11-09 NOTE — Patient Instructions (Signed)
Medication Instructions:  Please increase your Entresto to 49-51 mg twice a day.  Continue all other medications as listed.  *If you need a refill on your cardiac medications before your next appointment, please call your pharmacy*  Follow-Up: At [facility], you and your health needs are our priority.  As part of our continuing mission to provide you with exceptional heart care, we have created designated Provider Care Teams.  These Care Teams include your primary Cardiologist (physician) and Advanced Practice Providers (APPs -  Physician Assistants and Nurse Practitioners) who all work together to provide you with the care you need, when you need it.  We recommend signing up for the patient portal called "MyChart".  Sign up information is provided on this After Visit Summary.  MyChart is used to connect with patients for Virtual Visits (Telemedicine).  Patients are able to view lab/test results, encounter notes, upcoming appointments, etc.  Non-urgent messages can be sent to your provider as well.   To learn more about what you can do with MyChart, go to [email or web address].    Your next appointment:   4 week(s)  The format for your next appointment:   In Person  Provider:   You may see Candee Furbish, MD or one of the following Advanced Practice Providers on your designated Care Team:    Truitt Merle, NP  Cecilie Kicks, NP  Kathyrn Drown, NP    Thank you for choosing [facility]!!

## 2019-11-09 NOTE — Progress Notes (Signed)
Cardiology Office Note:    Date:  11/09/2019   ID:  Nicholas Knight, DOB November 09, 1970, MRN [id number]  PCP:  Cleophas Dunker, DO  Cardiologist:  Candee Furbish, MD  Electrophysiologist:  None   Referring MD: Cleophas Dunker, *    History of Present Illness:    Nicholas Knight is a 49 y.o. male here for follow-up of nonischemic cardiomyopathy.  EF 10 to 15% diagnosed in April 2020.  He has seen Dr. Haroldine Laws in consultation in October.  Repeat echocardiogram in July 20 to 25%.  Overall been doing quite well.  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  Can run 1-1/2 miles without any limitations.  Father had MI but no sudden cardiac death.  No tobacco use.  No familial disorders.  Blood pressures have been under good control.  Right and left heart catheterization in April showed normal coronary arteries.  Overall he still feels well, NYHA class I.  No symptoms.  Cost with Delene [name] has been an issue.  See below.  No fevers chills nausea vomiting syncope.  Past Medical History:  Diagnosis Date  . CHF (congestive heart failure) ([place])   . Diabetes mellitus type 2 in obese ([place]) 09/12/2015  . Diabetic ketoacidosis ([place]) 08/2019  . Exertional dyspnea   . Hypertension   . Nonischemic dilated cardiomyopathy [facility])     Past Surgical History:  Procedure Laterality Date  . RIGHT/LEFT HEART CATH AND CORONARY ANGIOGRAPHY N/A 11/28/2018   Procedure: RIGHT/LEFT HEART CATH AND CORONARY ANGIOGRAPHY;  Surgeon: Lorretta Harp, MD;  Location: [facility];  Service: Cardiovascular;  Laterality: N/A;    Current Medications: Current Meds  Medication Sig  . atorvastatin (LIPITOR) 80 MG tablet Take 1 tablet (80 mg total) by mouth daily at 6 PM.  . carvedilol (COREG) 25 MG tablet Take 1 tablet (25 mg total) by mouth 2 (two) times daily with a meal.  . furosemide (LASIX) 20 MG tablet Take 1 tablet (20 mg total) by mouth daily.  . Insulin Pen Needle (B-D UF III MINI PEN NEEDLES) 31G X 5 MM  MISC USE TO INJECT INSULIN AT BED TIME  . LEVEMIR FLEXTOUCH 100 UNIT/ML Pen INJECT 30 UNITS INTO SKIN AT BEDTIME  . metFORMIN (GLUCOPHAGE) 500 MG tablet Take 2 tablets (1,000 mg total) by mouth daily with breakfast.  . Semaglutide,0.25 or 0.5MG /DOS, (OZEMPIC, 0.25 OR 0.5 MG/DOSE,) 2 MG/1.5ML SOPN Inject 0.25 mg into the skin once a week. Inject 0.25mg  once weekly for 4 weeks, then increase to 0.5mg  once weekly.  . [DISCONTINUED] ENTRESTO 24-26 MG TAKE 1 TABLET BY MOUTH TWICE A DAY     Allergies:   Penicillins   Social History   Socioeconomic History  . Marital status: Single    Spouse name: Not on file  . Number of children: Not on file  . Years of education: Not on file  . Highest education level: Not on file  Occupational History  . Not on file  Tobacco Use  . Smoking status: Never Smoker  . Smokeless tobacco: Never Used  Substance and Sexual Activity  . Alcohol use: No  . Drug use: No  . Sexual activity: Yes  Other Topics Concern  . Not on file  Social History Narrative  . Not on file   Social Determinants of Health   Financial Resource Strain:   . Difficulty of Paying Living Expenses:   Food Insecurity:   . Worried About Charity fundraiser in the Last Year:   .  Ran Out of Food in the Last Year:   Transportation Needs:   . Film/video editor (Medical):   Marland Kitchen Lack of Transportation (Non-Medical):   Physical Activity:   . Days of Exercise per Week:   . Minutes of Exercise per Session:   Stress:   . Feeling of Stress :   Social Connections:   . Frequency of Communication with Friends and Family:   . Frequency of Social Gatherings with Friends and Family:   . Attends Religious Services:   . Active Member of Clubs or Organizations:   . Attends Archivist Meetings:   Marland Kitchen Marital Status:      Family History: The patient's family history includes Cancer in his mother; Lupus in his maternal grandmother.  ROS:   Please see the history of present illness.      All other systems reviewed and are negative.  EKGs/Labs/Other Studies Reviewed:    The following studies were reviewed today:   EKG:  EKG is not ordered today.    Recent Labs: 11/26/2018: TSH 2.542 11/27/2018: B Natriuretic Peptide 1,015.2 08/16/2019: Hemoglobin 16.0; Platelets 315 08/18/2019: ALT 27 08/21/2019: BUN 22; Creatinine, Ser 0.99; Potassium 4.6; Sodium 138  Recent Lipid Panel    Component Value Date/Time   CHOL 115 12/15/2018 0925   TRIG 84 12/15/2018 0925   HDL 34 (L) 12/15/2018 0925   CHOLHDL 3.4 12/15/2018 0925   CHOLHDL 4.3 11/27/2018 0217   VLDL 22 11/27/2018 0217   LDLCALC 64 12/15/2018 0925    Physical Exam:    VS:  BP (!) 140/100   Pulse 98   Ht 5\' 9"  (1.753 m)   Wt 204 lb (92.5 kg)   SpO2 97%   BMI 30.13 kg/m     Wt Readings from Last 3 Encounters:  11/09/19 204 lb (92.5 kg)  08/21/19 197 lb 9.6 oz (89.6 kg)  08/16/19 190 lb 3.2 oz (86.3 kg)     GEN:  Well nourished, well developed in no acute distress HEENT: Normal NECK: No JVD; No carotid bruits LYMPHATICS: No lymphadenopathy CARDIAC: RRR, no murmurs, rubs, gallops RESPIRATORY:  Clear to auscultation without rales, wheezing or rhonchi  ABDOMEN: Soft, non-tender, non-distended MUSCULOSKELETAL:  No edema; No deformity  SKIN: Warm and dry NEUROLOGIC:  Alert and oriented x 3 PSYCHIATRIC:  Normal affect   ASSESSMENT:    1. Chronic systolic heart failure ([place])   2. Essential hypertension    PLAN:    In order of problems listed above:  Nonischemic cardiomyopathy, chronic systolic heart failure -EF from 15 to 25%.  The suspicion that Dr. Haroldine Laws had was that this was likely secondary to difficult to control hypertension.  Currently NYHA class I.  He stopped his lisinopril and started him on Entresto.  Continued his spironolactone 25 and carvedilol 12.5.  Considered addition of SGLT2 inhibitor. -Given his blood pressure elevation today 140/100, we will go ahead and increase his Entresto  to 49/51 twice daily.  He has been having trouble with cost.  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  We will go ahead and give him a co-pay card. -In 1 month we will have him come back in to see how his blood pressure is.  We need to get this under good control.  Hypertensive heart disease with heart failure -Blood pressure was 152/93 at Dr. Clayborne Dana visit.  This was discussed.  He was switched to [facility].  Diabetes with hypertension -Consider SGLT2 inhibitor.  He was having  trouble with the cost of Ozempic    Medication Adjustments/Labs and Tests Ordered: Current medicines are reviewed at length with the patient today.  Concerns regarding medicines are outlined above.  No orders of the defined types were placed in this encounter.  Meds ordered this encounter  Medications  . sacubitril-valsartan (ENTRESTO) 49-51 MG    Sig: Take 1 tablet by mouth 2 (two) times daily.    Dispense:  60 tablet    Refill:  11    Patient Instructions  Medication Instructions:  Please increase your Entresto to 49-51 mg twice a day.  Continue all other medications as listed.  *If you need a refill on your cardiac medications before your next appointment, please call your pharmacy*  Follow-Up: At [facility], you and your health needs are our priority.  As part of our continuing mission to provide you with exceptional heart care, we have created designated Provider Care Teams.  These Care Teams include your primary Cardiologist (physician) and Advanced Practice Providers (APPs -  Physician Assistants and Nurse Practitioners) who all work together to provide you with the care you need, when you need it.  We recommend signing up for the patient portal called "MyChart".  Sign up information is provided on this After Visit Summary.  MyChart is used to connect with patients for Virtual Visits (Telemedicine).  Patients are able to view lab/test results, encounter notes, upcoming  appointments, etc.  Non-urgent messages can be sent to your provider as well.   To learn more about what you can do with MyChart, go to [email or web address].    Your next appointment:   4 week(s)  The format for your next appointment:   In Person  Provider:   You may see Candee Furbish, MD or one of the following Advanced Practice Providers on your designated Care Team:    Truitt Merle, NP  Cecilie Kicks, NP  Kathyrn Drown, NP    Thank you for choosing [facility]!!         Signed, Candee Furbish, MD  11/09/2019 10:35 AM    [place]

## 2019-11-11 IMAGING — CT CT ANGIOGRAPHY CHEST
1 of 7 series · 3 of 16 positions shown · IV contrast (omnipaque)
Comparison: None.

CLINICAL DATA: Short of breath

EXAM:
CT ANGIOGRAPHY CHEST WITH CONTRAST
TECHNIQUE: Multidetector CT imaging of the chest was performed using the
standard protocol during bolus administration of intravenous
contrast. Multiplanar CT image reconstructions and MIPs were
obtained to evaluate the vascular anatomy.
CONTRAST:  75mL OMNIPAQUE IOHEXOL 350 MG/ML SOLN

[Series 7: pe thins · axial · 0.94mm/px · z∈[+1156,+1328]mm · 3 of 492 slices shown]
[im 123/492  lung]
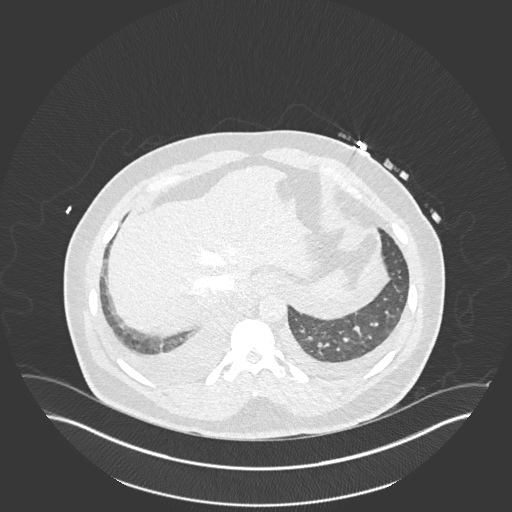
[im 246/492  soft-tissue]
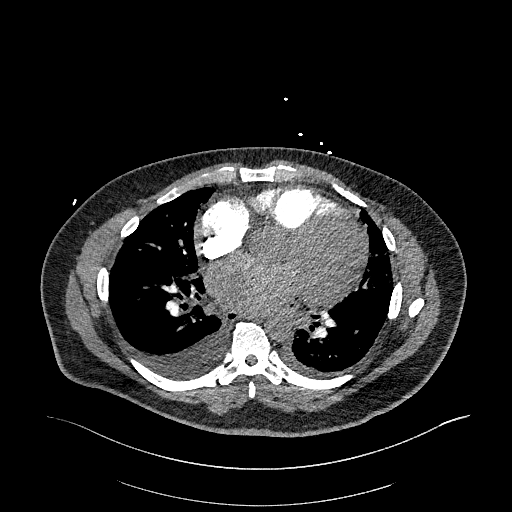
[im 369/492  lung]
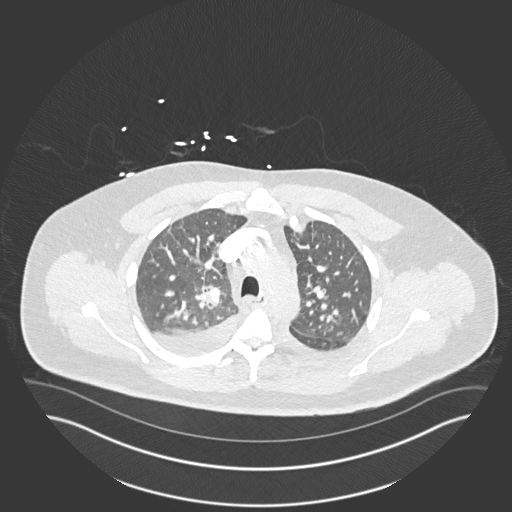

[3 of 16 positions shown; findings below may reference images not displayed]

FINDINGS: Cardiovascular: There are no filling defects in the pulmonary
arterial tree to suggest acute pulmonary thromboembolism. The left
ventricle is dilated. There is reflux of contrast into the hepatic
veins.

Mediastinum/Nodes: No abnormal mediastinal adenopathy. No
pericardial effusion. Thyroid is unremarkable esophagus is
unremarkable.

Lungs/Pleura: Small bilateral pleural effusions and associated
dependent atelectasis. There is peribronchovascular soft tissue
prominence in the hilar regions. Scattered interlobular septal
thickening is noted.

Upper Abdomen: No acute abnormality.

Musculoskeletal: No vertebral compression deformity.

Review of the MIP images confirms the above findings.
IMPRESSION: No evidence of acute pulmonary thromboembolism.

There is interlobular septal thickening which may represent
interstitial edema associated with bilateral pleural effusions, left
ventricular dilatation, and hilar soft tissue prominence. These
findings can be associated with CHF.

## 2019-12-03 ENCOUNTER — Other Ambulatory Visit: Payer: Self-pay | Admitting: Family Medicine

## 2019-12-03 DIAGNOSIS — E1169 Type 2 diabetes mellitus with other specified complication: Secondary | ICD-10-CM

## 2019-12-03 DIAGNOSIS — E669 Obesity, unspecified: Secondary | ICD-10-CM

## 2019-12-06 NOTE — Progress Notes (Deleted)
CARDIOLOGY OFFICE NOTE  Date:  12/06/2019    Nicholas Knight Date of Birth: [birth year] Medical Record #[id number]  PCP:  Nicholas Dunker, DO  Cardiologist:  Nicholas Knight No chief complaint on file.   History of Present Illness: Nicholas Knight is a 49 y.o. male who presents today for a one month check. Seen for Dr. Marlou Knight.   He has a history of NICM, EF 10 to 15% in 11/2018 - has seen Dr. Haroldine Knight - follow up echo with EF of 25% in July of 2020. Right and left heart catheterization in April showed normal coronary arteries. Looks like he was to have cardiac MRI - I do not see that this was ever completed.   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  Can run 1-1/2 miles without any limitations.  Father had MI but no sudden cardiac death.  No tobacco use.  No familial disorders.  Blood pressures have been under good control.  Last seen a month ago - noted cost of Delene [name] was an issue. Otherwise, doing ok but BP was up - Entresto was increased and coupon given.    The patient {does/does not:200015} have symptoms concerning for COVID-19 infection (fever, chills, cough, or new shortness of breath).   Comes in today. Here with   Past Medical History:  Diagnosis Date  . CHF (congestive heart failure) ([place])   . Diabetes mellitus type 2 in obese ([place]) 09/12/2015  . Diabetic ketoacidosis ([place]) 08/2019  . Exertional dyspnea   . Hypertension   . Nonischemic dilated cardiomyopathy [facility])     Past Surgical History:  Procedure Laterality Date  . RIGHT/LEFT HEART CATH AND CORONARY ANGIOGRAPHY N/A 11/28/2018   Procedure: RIGHT/LEFT HEART CATH AND CORONARY ANGIOGRAPHY;  Surgeon: Nicholas Harp, MD;  Location: [facility];  Service: Cardiovascular;  Laterality: N/A;     Medications: No outpatient medications have been marked as taking for the 12/11/19 encounter (Appointment) with Nicholas Junes, NP.     Allergies: Allergies  Allergen Reactions  . Penicillins Other (See  Comments)    Did it involve swelling of the face/tongue/throat, SOB, or low BP? N/A Did it involve sudden or severe rash/hives, skin peeling, or any reaction on the inside of your mouth or nose?N/A Did you need to seek medical attention at a hospital or doctor's office? N/A When did it last happen?Childhood  If all above answers are "NO", may proceed with cephalosporin use.    Social History: The patient  reports that he has never smoked. He has never used smokeless tobacco. He reports that he does not drink alcohol or use drugs.   Family History: The patient's ***family history includes Cancer in his mother; Lupus in his maternal grandmother.   Review of Systems: Please see the history of present illness.   All other systems are reviewed and negative.   Physical Exam: VS:  There were no vitals taken for this visit. Marland Kitchen  BMI There is no height or weight on file to calculate BMI.  Wt Readings from Last 3 Encounters:  11/09/19 204 lb (92.5 kg)  08/21/19 197 lb 9.6 oz (89.6 kg)  08/16/19 190 lb 3.2 oz (86.3 kg)    General: Pleasant. Well developed, well nourished and in no acute distress.   HEENT: Normal.  Neck: Supple, no JVD, carotid bruits, or masses noted.  Cardiac: ***Regular rate and rhythm. No murmurs, rubs, or gallops. No edema.  Respiratory:  Lungs are clear to auscultation bilaterally with  normal work of breathing.  GI: Soft and nontender.  MS: No deformity or atrophy. Gait and ROM intact.  Skin: Warm and dry. Color is normal.  Neuro:  Strength and sensation are intact and no gross focal deficits noted.  Psych: Alert, appropriate and with normal affect.   LABORATORY DATA:  EKG:  EKG {ACTION; IS/IS ZOX:[id number]} ordered today.  Personally reviewed by me. This demonstrates ***.  Lab Results  Component Value Date   WBC 8.0 08/16/2019   HGB 16.0 08/16/2019   HCT 47.0 08/16/2019   PLT 315 08/16/2019   GLUCOSE 207 (H) 08/21/2019   CHOL 115 12/15/2018   TRIG 84  12/15/2018   HDL 34 (L) 12/15/2018   LDLCALC 64 12/15/2018   ALT 27 08/18/2019   AST 21 08/18/2019   NA 138 08/21/2019   K 4.6 08/21/2019   CL 101 08/21/2019   CREATININE 0.99 08/21/2019   BUN 22 08/21/2019   CO2 24 08/21/2019   TSH 2.542 11/26/2018   INR 1.2 11/26/2018   HGBA1C >15.5 (H) 08/17/2019     BNP (last 3 results) No results for input(s): BNP in the last 8760 hours.  ProBNP (last 3 results) No results for input(s): PROBNP in the last 8760 hours.   Other Studies Reviewed Today:  ECHO IMPRESSIONS 02/2019  1. The left ventricle has severely reduced systolic function, with an  ejection fraction of 20-25%. The cavity size was moderately dilated. There  is mild concentric left ventricular hypertrophy. Left ventricular  diastolic Doppler parameters are  consistent with impaired relaxation. Left ventricular diffuse hypokinesis.  2. The right ventricle has moderately reduced systolic function. The  cavity was moderately enlarged. There is no increase in right ventricular  wall thickness. Right ventricular systolic pressure is normal.  3. Left atrial size was mildly dilated.  4. Right atrial size was mildly dilated.  5. No stenosis of the aortic valve.   CARDIAC CATH 11/2018 IMPRESSION: Nicholas Knight has nonischemic cardiomyopathy with clean coronary arteries, a dilated left ventricle with an EF of 15%.  He will need pharmacologic optimization including carvedilol, Entresto if his blood pressure allows, spironolactone.  He may be a candidate for a LifeVest as well.  The sheaths were removed and a TR band was placed on the right wrist to achieve patent hemostasis.  The patient left lab in stable condition.  Nicholas Knight. MD, [facility] 11/28/2018 2:19 PM  ASSESSMENT & PLAN:    1. NICM with chronic systolic HF  2. HTN  3. DM Nonischemic cardiomyopathy, chronic systolic heart failure -EF from 15 to 25%.  The suspicion that Dr. Haroldine Knight had was that this was likely  secondary to difficult to control hypertension.  Currently NYHA class I.  He stopped his lisinopril and started him on Entresto.  Continued his spironolactone 25 and carvedilol 12.5.  Considered addition of SGLT2 inhibitor. -Given his blood pressure elevation today 140/100, we will go ahead and increase his Entresto to 49/51 twice daily.  He has been having trouble with cost.  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  We will go ahead and give him a co-pay card. -In 1 month we will have him come back in to see how his blood pressure is.  We need to get this under good control.  Hypertensive heart disease with heart failure -Blood pressure was 152/93 at Dr. Clayborne Dana visit.  This was discussed.  He was switched to [facility].  Diabetes with hypertension -Consider SGLT2 inhibitor.  He was  having trouble with the cost of Ozempic    . COVID-19 Education: The signs and symptoms of COVID-19 were discussed with the patient and how to seek care for testing (follow up with PCP or arrange E-visit).  The importance of social distancing, staying at home, hand hygiene and wearing a mask when out in public were discussed today.  Current medicines are reviewed with the patient today.  The patient does not have concerns regarding medicines other than what has been noted above.  The following changes have been made:  See above.  Labs/ tests ordered today include:   No orders of the defined types were placed in this encounter.    Disposition:   FU with *** in {gen number 6-28:638177} {Days to years:10300}.   Patient is agreeable to this plan and will call if any problems develop in the interim.   SignedTruitt Merle, NP  12/06/2019 12:31 PM  [facility] [address] [place], [place]  [address] Phone: [phone] Fax: [phone]

## 2019-12-11 ENCOUNTER — Ambulatory Visit: Payer: PRIVATE HEALTH INSURANCE | Admitting: Nurse Practitioner

## 2019-12-27 NOTE — Progress Notes (Signed)
CARDIOLOGY OFFICE NOTE  Date:  01/02/2020     Nicholas Knight Date of Birth: [birth year] Medical Record #[id number]  PCP:  Nicholas Dunker, DO  Cardiologist:  [facility]  Chief Complaint  Patient presents with  . Follow-up    Seen for Dr. Marlou Knight    History of Present Illness: Nicholas Knight is a 49 y.o. male who presents today for a follow up visit. Seen for Dr. Marlou Knight.   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  He has seen Dr. Haroldine Knight in consultation in October.  Repeat echocardiogram in July showed EF of 20 to 25%.  He has had normal coronaries by prior cath. He has had uncontrolled DM.   Last seen in April - NYHA I - cost of Delene [name] was an issue. Otherwise, doing well.   The patient does not have symptoms concerning for COVID-19 infection (fever, chills, cough, or new shortness of breath).   Comes in today. Here alone. He says he is fine. Feels good. Not short of breath. No swelling. 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 Tells me he had his cardiac MRI - I do not see this. Did not get sleep study arranged but says he is willing to do. Reports much lower BP at home - was 120/80 two days ago. Says he is taking his medicines.   Past Medical History:  Diagnosis Date  . CHF (congestive heart failure) ([place])   . Diabetes mellitus type 2 in obese ([name]) 09/12/2015  . Diabetic ketoacidosis ([place]) 08/2019  . Exertional dyspnea   . Hypertension   . Nonischemic dilated cardiomyopathy [facility])     Past Surgical History:  Procedure Laterality Date  . RIGHT/LEFT HEART CATH AND CORONARY ANGIOGRAPHY N/A 11/28/2018   Procedure: RIGHT/LEFT HEART CATH AND CORONARY ANGIOGRAPHY;  Surgeon: Nicholas Harp, MD;  Location: [place] [facility];  Service: Cardiovascular;  Laterality: N/A;     Medications: Current Meds  Medication Sig  . atorvastatin (LIPITOR) 80 MG tablet Take 1 tablet (80 mg total) by  mouth daily at 6 PM.  . carvedilol (COREG) 25 MG tablet Take 1 tablet (25 mg total) by mouth 2 (two) times daily with a meal.  . furosemide (LASIX) 20 MG tablet Take 1 tablet (20 mg total) by mouth daily.  . Insulin Pen Needle (B-D UF III MINI PEN NEEDLES) 31G X 5 MM MISC USE TO INJECT INSULIN AT BED TIME  . LEVEMIR FLEXTOUCH 100 UNIT/ML Pen INJECT 30 UNITS INTO SKIN AT BEDTIME  . metFORMIN (GLUCOPHAGE) 500 MG tablet Take 2 tablets (1,000 mg total) by mouth daily with breakfast.  . [DISCONTINUED] OZEMPIC, 0.25 OR 0.5 MG/DOSE, 2 MG/1.5ML SOPN INJECT 0.25MG  INTO THE SKIN ONCE WEEK FOR 4 WEEKS THEN INCREASE TO 0.5MG  ONCE WEEKLY  . [DISCONTINUED] sacubitril-valsartan (ENTRESTO) 49-51 MG Take 1 tablet by mouth 2 (two) times daily.     Allergies: Allergies  Allergen Reactions  . Penicillins Other (See Comments)    Did it involve swelling of the face/tongue/throat, SOB, or low BP? N/A Did it involve sudden or severe rash/hives, skin peeling, or any reaction on the inside of your mouth or nose?N/A Did you need to seek medical attention at a hospital or doctor's office? N/A When did it last happen?Childhood  If all above answers are "NO", may proceed with cephalosporin use.    Social History: The  patient  reports that he has never smoked. He has never used smokeless tobacco. He reports that he does not drink alcohol or use drugs.   Family History: The patient's family history includes Cancer in his mother; Lupus in his maternal grandmother.   Review of Systems: Please see the history of present illness.   All other systems are reviewed and negative.   Physical Exam: VS:  BP (!) 150/106 Comment: 144/106 in left arm  Pulse 83   Ht 5\' 9"  (1.753 m)   Wt 206 lb (93.4 kg)   SpO2 97%   BMI 30.42 kg/m  .  BMI Body mass index is 30.42 kg/m.  Wt Readings from Last 3 Encounters:  01/02/20 206 lb (93.4 kg)  11/09/19 204 lb (92.5 kg)  08/21/19 197 lb 9.6 oz (89.6 kg)    General: Alert and  in no acute distress.   Cardiac: Regular rate and rhythm. No murmurs, rubs, or gallops. No edema.  Respiratory:  Lungs are clear to auscultation bilaterally with normal work of breathing.  GI: Soft and nontender.  MS: No deformity or atrophy. Gait and ROM intact.  Skin: Warm and dry. Color is normal.  Neuro:  Strength and sensation are intact and no gross focal deficits noted.  Psych: Alert, appropriate and with normal affect.   LABORATORY DATA:  EKG:  EKG is not ordered today.    Lab Results  Component Value Date   WBC 8.0 08/16/2019   HGB 16.0 08/16/2019   HCT 47.0 08/16/2019   PLT 315 08/16/2019   GLUCOSE 207 (H) 08/21/2019   CHOL 115 12/15/2018   TRIG 84 12/15/2018   HDL 34 (L) 12/15/2018   LDLCALC 64 12/15/2018   ALT 27 08/18/2019   AST 21 08/18/2019   NA 138 08/21/2019   K 4.6 08/21/2019   CL 101 08/21/2019   CREATININE 0.99 08/21/2019   BUN 22 08/21/2019   CO2 24 08/21/2019   TSH 2.542 11/26/2018   INR 1.2 11/26/2018   HGBA1C >15.5 (H) 08/17/2019     BNP (last 3 results) No results for input(s): BNP in the last 8760 hours.  ProBNP (last 3 results) No results for input(s): PROBNP in the last 8760 hours.   Other Studies Reviewed Today:  ECHO IMPRESSIONS July 2020  1. The left ventricle has severely reduced systolic function, with an  ejection fraction of 20-25%. The cavity size was moderately dilated. There  is mild concentric left ventricular hypertrophy. Left ventricular  diastolic Doppler parameters are  consistent with impaired relaxation. Left ventricular diffuse hypokinesis.  2. The right ventricle has moderately reduced systolic function. The  cavity was moderately enlarged. There is no increase in right ventricular  wall thickness. Right ventricular systolic pressure is normal.  3. Left atrial size was mildly dilated.  4. Right atrial size was mildly dilated.  5. No stenosis of the aortic valve.    CARDIAC CATH 11/2018 IMPRESSION: Mr.  Nicholas Knight has nonischemic cardiomyopathy with clean coronary arteries, a dilated left ventricle with an EF of 15%.  He will need pharmacologic optimization including carvedilol, Entresto if his blood pressure allows, spironolactone.  He may be a candidate for a LifeVest as well.  The sheaths were removed and a TR band was placed on the right wrist to achieve patent hemostasis.  The patient left lab in stable condition.  Quay Burow. MD, [facility] 11/28/2018 2:19 PM  ASSESSMENT & PLAN:    1. Chronic systolic HF/NICM - on Entresto, beta blocker and  diuretic - I am increasing the Entresto to 97/103 BID today. I do not see where he had cardiac MRI but he says he has, nor did the sleep study get completed - will see about getting these studies. Would favor adding back Aldactone on return - I think compliance is an issue. Salt restriction is encouraged - difficult with his job.   2. HTN - BP recheck by me is 130/100 - see above.   3. Uncontrolled DM - recheck A1C today. He was not able to afford Ozempic.   4. Prior cath with normal coronaries.   5. COVID-19 Education: The signs and symptoms of COVID-19 were discussed with the patient and how to seek care for testing (follow up with PCP or arrange E-visit).  The importance of social distancing, staying at home, hand hygiene and wearing a mask when out in public were discussed today.  Current medicines are reviewed with the patient today.  The patient does not have concerns regarding medicines other than what has been noted above.  The following changes have been made:  See above.  Labs/ tests ordered today include:    Orders Placed This Encounter  Procedures  . Basic metabolic panel  . Hepatic function panel  . Hemoglobin A1c     Disposition:   FU with [place] in about 3 weeks. He is to bring his BP cuff with him.    Patient is agreeable to this plan and will call if any problems develop in the interim.   SignedTruitt Merle, NP  01/02/2020  3:23 PM  [place] [address] [place], [place]  [address] Phone: [phone] Fax: [phone]

## 2020-01-02 ENCOUNTER — Other Ambulatory Visit: Payer: Self-pay

## 2020-01-02 ENCOUNTER — Ambulatory Visit (INDEPENDENT_AMBULATORY_CARE_PROVIDER_SITE_OTHER): Payer: PRIVATE HEALTH INSURANCE | Admitting: Nurse Practitioner

## 2020-01-02 ENCOUNTER — Encounter: Payer: Self-pay | Admitting: Nurse Practitioner

## 2020-01-02 VITALS — BP 150/106 | HR 83 | Ht 69.0 in | Wt 206.0 lb

## 2020-01-02 DIAGNOSIS — E1169 Type 2 diabetes mellitus with other specified complication: Secondary | ICD-10-CM

## 2020-01-02 DIAGNOSIS — R0683 Snoring: Secondary | ICD-10-CM

## 2020-01-02 DIAGNOSIS — I10 Essential (primary) hypertension: Secondary | ICD-10-CM

## 2020-01-02 DIAGNOSIS — I420 Dilated cardiomyopathy: Secondary | ICD-10-CM

## 2020-01-02 DIAGNOSIS — E669 Obesity, unspecified: Secondary | ICD-10-CM

## 2020-01-02 DIAGNOSIS — I5022 Chronic systolic (congestive) heart failure: Secondary | ICD-10-CM | POA: Diagnosis not present

## 2020-01-02 MED ORDER — SACUBITRIL-VALSARTAN 97-103 MG PO TABS: 1.0000 | ORAL_TABLET | Freq: Two times a day (BID) | ORAL | 6 refills | Status: DC

## 2020-01-02 NOTE — Patient Instructions (Addendum)
After Visit Summary:  We will be checking the following labs today - BMET, HPF and A1c   Medication Instructions:    Continue with your current medicines. BUT  I am increasing the Entresto to the 97/103 mg dose to take twice a day - this is at your pharmacy.    If you need a refill on your cardiac medications before your next appointment, please call your pharmacy.     Testing/Procedures To Be Arranged:  N/A  Follow-Up:   See me or Dr. Marlou Porch in about 3 to 4 weeks. Bring your BP cuff with you to this visit.     At [facility], you and your health needs are our priority.  As part of our continuing mission to provide you with exceptional heart care, we have created designated Provider Care Teams.  These Care Teams include your primary Cardiologist (physician) and Advanced Practice Providers (APPs -  Physician Assistants and Nurse Practitioners) who all work together to provide you with the care you need, when you need it.  Special Instructions:  . Stay safe, stay home, wash your hands for at least 20 seconds and wear a mask when out in public.  . It was good to talk with you today.  . We will try to get your sleep study arranged.    Call the [place] office at [phone] if you have any questions, problems or concerns.

## 2020-01-03 ENCOUNTER — Telehealth: Payer: Self-pay | Admitting: Cardiology

## 2020-01-03 LAB — BASIC METABOLIC PANEL
BUN/Creatinine Ratio: 12 (ref 9–20)
BUN: 15 mg/dL (ref 6–24)
CO2: 25 mmol/L (ref 20–29)
Calcium: 9.7 mg/dL (ref 8.7–10.2)
Chloride: 104 mmol/L (ref 96–106)
Creatinine, Ser: 1.3 mg/dL — ABNORMAL HIGH (ref 0.76–1.27)
GFR calc Af Amer: 74 mL/min/{1.73_m2} (ref >59)
GFR calc non Af Amer: 64 mL/min/{1.73_m2} (ref >59)
Glucose: 160 mg/dL — ABNORMAL HIGH (ref 65–99)
Potassium: 4.1 mmol/L (ref 3.5–5.2)
Sodium: 143 mmol/L (ref 134–144)

## 2020-01-03 LAB — HEPATIC FUNCTION PANEL
ALT: 42 IU/L (ref 0–44)
AST: 27 IU/L (ref 0–40)
Albumin: 4.4 g/dL (ref 4.0–5.0)
Alkaline Phosphatase: 64 IU/L (ref 48–121)
Bilirubin Total: 0.3 mg/dL (ref 0.0–1.2)
Bilirubin, Direct: 0.11 mg/dL (ref 0.00–0.40)
Total Protein: 7.1 g/dL (ref 6.0–8.5)

## 2020-01-03 LAB — HEMOGLOBIN A1C
Est. average glucose Bld gHb Est-mCnc: 154 mg/dL
Hgb A1c MFr Bld: 7 % — ABNORMAL HIGH (ref 4.8–5.6)

## 2020-01-03 NOTE — Telephone Encounter (Signed)
Patient returning Daneille's call in regards to lab results.

## 2020-01-03 NOTE — Telephone Encounter (Signed)
Patient returning Nicholas Knight's call.

## 2020-01-08 ENCOUNTER — Other Ambulatory Visit: Payer: Self-pay

## 2020-01-08 ENCOUNTER — Ambulatory Visit (INDEPENDENT_AMBULATORY_CARE_PROVIDER_SITE_OTHER): Payer: PRIVATE HEALTH INSURANCE | Admitting: Family Medicine

## 2020-01-08 ENCOUNTER — Encounter: Payer: Self-pay | Admitting: Family Medicine

## 2020-01-08 VITALS — BP 132/80 | HR 70 | Ht 69.0 in | Wt 203.0 lb

## 2020-01-08 DIAGNOSIS — Z1211 Encounter for screening for malignant neoplasm of colon: Secondary | ICD-10-CM | POA: Diagnosis not present

## 2020-01-08 DIAGNOSIS — I5020 Unspecified systolic (congestive) heart failure: Secondary | ICD-10-CM | POA: Diagnosis not present

## 2020-01-08 DIAGNOSIS — E119 Type 2 diabetes mellitus without complications: Secondary | ICD-10-CM | POA: Diagnosis not present

## 2020-01-08 MED ORDER — EMPAGLIFLOZIN 10 MG PO TABS: 10.0000 mg | ORAL_TABLET | Freq: Every day | ORAL | 3 refills | Status: DC

## 2020-01-08 NOTE — Patient Instructions (Signed)
Thank you for coming to see me today. It was a pleasure. Today we talked about:   We will start the new medicine for diabetes that also helps your heart.  It should be called Jardiance, but we willsee what your insurance approves.  If you get sick and can't eat or drink, you should stop taking this until you feel better.  Remember to stay well-hydrated.  Please follow-up with me in 4-6 weeks.  If you have any questions or concerns, please do not hesitate to call the office at [phone].  Best,   [place] Constable, DO

## 2020-01-08 NOTE — Assessment & Plan Note (Signed)
Colonoscopy ordered and form given.

## 2020-01-08 NOTE — Progress Notes (Signed)
° ° °  SUBJECTIVE:   CHIEF COMPLAINT / HPI:   Diabetes follow-up Current regimen: Metformin 1000 mg twice daily, Levemir 33 units Was on Ozempic and doing well but off for 3 weeks due to cost as insurance no longer approving On Entresto and Lipitor CBG 120 this AM, highest number has been 200 Last A1c 7.0 on 01/02/2020 Last BMP on 6/1, creatinine about baseline  HFrEF Denies CP and SOB Was seen by cardiology on 6/1 Has an appointment in 3 weeks Entresto was increased  Thinks his last Tdap was 2015  PERTINENT  PMH / PSH: HFrEF, T2DM, hypertension  OBJECTIVE:   BP 132/80    Pulse 70    Ht 5\' 9"  (1.753 m)    Wt 203 lb (92.1 kg)    BMI 29.98 kg/m    Physical Exam:  General: 49 y.o. male in NAD Cardio: RRR no m/r/g Lungs: CTAB, no wheezing, no rhonchi, no crackles, no IWOB on RA Skin: warm and dry Extremities: No edema   ASSESSMENT/PLAN:   Diabetes mellitus without complication (HCC) E5U well controlled on 6/1 at 7.  Patient has been without Ozempic for the last 3 weeks however.  Now having difficulty with affording medication.  At last visit with me, SGLT2 was not started given that he had recently had DKA, now safe to start SGLT2.  Patient just had BMP on 6/1.  Plan to have him return in 6 weeks, will repeat BMP at that time.  Patient educated on SGLT2 inhibitor and propensity for dehydration especially when ill.  Advised that if he is not able to tolerate a p.o. diet due to nausea and vomiting at any time, he should discontinue this medicine while he is feeling ill.  He voiced understanding.  Heart failure with reduced ejection fraction ([place]) Follows with cardiology.  No chest pain or shortness of breath.  Will start patient on SGLT2 for diabetes as well as HFrEF.  Has appointment with cardiology at the end of the month.  Currently on Entresto as well.  Screen for colon cancer Colonoscopy ordered and form given.     Cleophas Dunker, [place]

## 2020-01-08 NOTE — Assessment & Plan Note (Signed)
A1c well controlled on 6/1 at 7.  Patient has been without Ozempic for the last 3 weeks however.  Now having difficulty with affording medication.  At last visit with me, SGLT2 was not started given that he had recently had DKA, now safe to start SGLT2.  Patient just had BMP on 6/1.  Plan to have him return in 6 weeks, will repeat BMP at that time.  Patient educated on SGLT2 inhibitor and propensity for dehydration especially when ill.  Advised that if he is not able to tolerate a p.o. diet due to nausea and vomiting at any time, he should discontinue this medicine while he is feeling ill.  He voiced understanding.

## 2020-01-08 NOTE — Assessment & Plan Note (Signed)
Follows with cardiology.  No chest pain or shortness of breath.  Will start patient on SGLT2 for diabetes as well as HFrEF.  Has appointment with cardiology at the end of the month.  Currently on Entresto as well.

## 2020-01-09 ENCOUNTER — Encounter: Payer: Self-pay | Admitting: Gastroenterology

## 2020-01-23 NOTE — Progress Notes (Signed)
CARDIOLOGY OFFICE NOTE  Date:  01/29/2020    Nicholas Knight Date of Birth: [birth year] Medical Record #[id number]  PCP:  Nicholas Dunker, DO  Cardiologist:  Nicholas Knight   Chief Complaint  Patient presents with  . Follow-up    History of Present Illness: Nicholas Knight is a 49 y.o. male who presents today for a follow up visit. Seen for Nicholas Knight.   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 He has seen Nicholas Knight in consultation in October. Repeat echocardiogram in July showed EF of 20 to 25%. He has had normal coronaries by prior cath. He has had uncontrolled DM.   Last seen in April - NYHA I - cost of Delene [name] was an issue. Otherwise, doing well. 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 He told me he has had his cardiac MRI - I did not see this. Was willing to get a sleep study - this had not been done either.  Reportedly with better BP at home.   The patient does not have symptoms concerning for COVID-19 infection (fever, chills, cough, or new shortness of breath).   Comes in today. Here alone. He feels good. No symptoms. Not dizzy. No chest pain. Breathing is fine. Says he is fine. He is not sure if his BP cuff is accurate - has much lower readings at home.   Past Medical History:  Diagnosis Date  . CHF (congestive heart failure) ([place])   . Diabetes mellitus type 2 in obese ([place]) 09/12/2015  . Diabetic ketoacidosis ([place]) 08/2019  . Exertional dyspnea   . Hypertension   . Nonischemic dilated cardiomyopathy [facility])     Past Surgical History:  Procedure Laterality Date  . RIGHT/LEFT HEART CATH AND CORONARY ANGIOGRAPHY N/A 11/28/2018   Procedure: RIGHT/LEFT HEART CATH AND CORONARY ANGIOGRAPHY;  Surgeon: Nicholas Harp, MD;  Location: [place] [facility];  Service: Cardiovascular;  Laterality: N/A;     Medications: Current Meds  Medication  Sig  . atorvastatin (LIPITOR) 80 MG tablet Take 1 tablet (80 mg total) by mouth daily at 6 PM.  . carvedilol (COREG) 25 MG tablet Take 1 tablet (25 mg total) by mouth 2 (two) times daily with a meal.  . empagliflozin (JARDIANCE) 10 MG TABS tablet Take 1 tablet (10 mg total) by mouth daily.  . furosemide (LASIX) 20 MG tablet Take 1 tablet (20 mg total) by mouth daily.  . Insulin Pen Needle (B-D UF III MINI PEN NEEDLES) 31G X 5 MM MISC USE TO INJECT INSULIN AT BED TIME  . LEVEMIR FLEXTOUCH 100 UNIT/ML Pen INJECT 30 UNITS INTO SKIN AT BEDTIME  . metFORMIN (GLUCOPHAGE) 500 MG tablet Take 2 tablets (1,000 mg total) by mouth daily with breakfast.  . sacubitril-valsartan (ENTRESTO) 97-103 MG Take 1 tablet by mouth 2 (two) times daily.     Allergies: Allergies  Allergen Reactions  . Penicillins Other (See Comments)    Did it involve swelling of the face/tongue/throat, SOB, or low BP? N/A Did it involve sudden or severe rash/hives, skin peeling, or any reaction on the inside of your mouth or nose?N/A Did you need to seek medical attention at a hospital or doctor's office? N/A When did it last happen?Childhood  If all above answers are "NO", may proceed with cephalosporin use.    Social History: The  patient  reports that he has never smoked. He has never used smokeless tobacco. He reports that he does not drink alcohol and does not use drugs.   Family History: The patient's family history includes Cancer in his mother; Lupus in his maternal grandmother.   Review of Systems: Please see the history of present illness.   All other systems are reviewed and negative.   Physical Exam: VS:  BP (!) 160/96   Pulse 92   Ht 5\' 9"  (1.753 m)   Wt 204 lb (92.5 kg)   SpO2 95%   BMI 30.13 kg/m  .  BMI Body mass index is 30.13 kg/m.  Wt Readings from Last 3 Encounters:  01/29/20 204 lb (92.5 kg)  01/08/20 203 lb (92.1 kg)  01/02/20 206 lb (93.4 kg)   BP is 150/100 by me. General: Pleasant.  Well developed, well nourished and in no acute distress.   Cardiac: Regular rate and rhythm. No murmurs, rubs, or gallops. No edema.  Respiratory:  Lungs are clear to auscultation bilaterally with normal work of breathing.  GI: Soft and nontender.  MS: No deformity or atrophy. Gait and ROM intact.  Skin: Warm and dry. Color is normal.  Neuro:  Strength and sensation are intact and no gross focal deficits noted.  Psych: Alert, appropriate and with normal affect.   LABORATORY DATA:  EKG:  EKG is not ordered today.    Lab Results  Component Value Date   WBC 8.0 08/16/2019   HGB 16.0 08/16/2019   HCT 47.0 08/16/2019   PLT 315 08/16/2019   GLUCOSE 160 (H) 01/02/2020   CHOL 115 12/15/2018   TRIG 84 12/15/2018   HDL 34 (L) 12/15/2018   LDLCALC 64 12/15/2018   ALT 42 01/02/2020   AST 27 01/02/2020   NA 143 01/02/2020   K 4.1 01/02/2020   CL 104 01/02/2020   CREATININE 1.30 (H) 01/02/2020   BUN 15 01/02/2020   CO2 25 01/02/2020   TSH 2.542 11/26/2018   INR 1.2 11/26/2018   HGBA1C 7.0 (H) 01/02/2020     BNP (last 3 results) No results for input(s): BNP in the last 8760 hours.  ProBNP (last 3 results) No results for input(s): PROBNP in the last 8760 hours.   Other Studies Reviewed Today:   ECHO IMPRESSIONS July 2020  1. The left ventricle has severely reduced systolic function, with an  ejection fraction of 20-25%. The cavity size was moderately dilated. There  is mild concentric left ventricular hypertrophy. Left ventricular  diastolic Doppler parameters are  consistent with impaired relaxation. Left ventricular diffuse hypokinesis.  2. The right ventricle has moderately reduced systolic function. The  cavity was moderately enlarged. There is no increase in right ventricular  wall thickness. Right ventricular systolic pressure is normal.  3. Left atrial size was mildly dilated.  4. Right atrial size was mildly dilated.  5. No stenosis of the aortic valve.     CARDIAC CATH 11/2018 IMPRESSION:Nicholas Knight has nonischemic cardiomyopathy with clean coronary arteries, a dilated left ventricle with an EF of 15%. He will need pharmacologic optimization including carvedilol, Entresto if his blood pressure allows, spironolactone. He may be a candidate for a LifeVest as well. The sheaths were removed and a TR band was placed on the right wrist to achieve patent hemostasis. The patient left lab in stable condition.  Quay Burow. MD, [facility] 11/28/2018 2:19 PM  ASSESSMENT & PLAN:   1. Chronic systolic HF/NICM - on Entresto,, beta blocker and  diuretic - I am adding back Aldactone today at 12.5 mg a day. BMET today and again in a week. Will get the cardiac MRI rescheduled. He has follow up in CHF next month. NYHA I/II at this time.   2. HTN - see above. I asked him to take his cuff to his next appointment so we could check it. Adding Aldactone back today.  3. DM - was not able to afford Ozempic. His A1C as improved.   4. Prior cath with normal coronaries noted.   Current medicines are reviewed with the patient today.  The patient does not have concerns regarding medicines other than what has been noted above.  The following changes have been made:  See above.  Labs/ tests ordered today include:    Orders Placed This Encounter  Procedures  . Basic metabolic panel  . Basic metabolic panel     Disposition:   FU with CHF clinic next month as planned. Will try to get cardiac MRI rescheduled prior to that visit as well.    Patient is agreeable to this plan and will call if any problems develop in the interim.   SignedTruitt Merle, NP  01/29/2020 2:49 PM  [place] [address] [place], [place]  [address] Phone: [phone] Fax: [phone]

## 2020-01-29 ENCOUNTER — Other Ambulatory Visit: Payer: Self-pay

## 2020-01-29 ENCOUNTER — Encounter: Payer: Self-pay | Admitting: Nurse Practitioner

## 2020-01-29 ENCOUNTER — Ambulatory Visit (INDEPENDENT_AMBULATORY_CARE_PROVIDER_SITE_OTHER): Payer: PRIVATE HEALTH INSURANCE | Admitting: Nurse Practitioner

## 2020-01-29 VITALS — BP 160/96 | HR 92 | Ht 69.0 in | Wt 204.0 lb

## 2020-01-29 DIAGNOSIS — I5022 Chronic systolic (congestive) heart failure: Secondary | ICD-10-CM | POA: Diagnosis not present

## 2020-01-29 DIAGNOSIS — I420 Dilated cardiomyopathy: Secondary | ICD-10-CM

## 2020-01-29 DIAGNOSIS — I10 Essential (primary) hypertension: Secondary | ICD-10-CM | POA: Diagnosis not present

## 2020-01-29 MED ORDER — SPIRONOLACTONE 25 MG PO TABS
12.5000 mg | ORAL_TABLET | Freq: Every day | ORAL | 3 refills | Status: DC
Start: 2020-01-29 — End: 2020-04-25

## 2020-01-29 NOTE — Patient Instructions (Addendum)
After Visit Summary:  We will be checking the following labs today - BMET  BMET in one week   Medication Instructions:    Continue with your current medicines. BUT  I am adding Aldactone 25 mg to take just 1/2 a tablet a day - you probably have this at home - check and make sure the bottle is still in date   If you need a refill on your cardiac medications before your next appointment, please call your pharmacy.     Testing/Procedures To Be Arranged:  We will get your Cardiac MRI set up  Follow-Up:   See the heart failure clinic next month - take your BP cuff with you for them to check and see if it correlates.     At [facility], you and your health needs are our priority.  As part of our continuing mission to provide you with exceptional heart care, we have created designated Provider Care Teams.  These Care Teams include your primary Cardiologist (physician) and Advanced Practice Providers (APPs -  Physician Assistants and Nurse Practitioners) who all work together to provide you with the care you need, when you need it.  Special Instructions:  . Stay safe, wash your hands for at least 20 seconds and wear a mask when needed.  . It was good to talk with you today.    Call the [place] office at [phone] if you have any questions, problems or concerns.

## 2020-01-30 LAB — BASIC METABOLIC PANEL
BUN/Creatinine Ratio: 16 (ref 9–20)
BUN: 21 mg/dL (ref 6–24)
CO2: 24 mmol/L (ref 20–29)
Calcium: 9.8 mg/dL (ref 8.7–10.2)
Chloride: 104 mmol/L (ref 96–106)
Creatinine, Ser: 1.28 mg/dL — ABNORMAL HIGH (ref 0.76–1.27)
GFR calc Af Amer: 75 mL/min/{1.73_m2} (ref >59)
GFR calc non Af Amer: 65 mL/min/{1.73_m2} (ref >59)
Glucose: 136 mg/dL — ABNORMAL HIGH (ref 65–99)
Potassium: 4 mmol/L (ref 3.5–5.2)
Sodium: 141 mmol/L (ref 134–144)

## 2020-02-09 ENCOUNTER — Other Ambulatory Visit: Payer: PRIVATE HEALTH INSURANCE

## 2020-02-29 ENCOUNTER — Encounter (HOSPITAL_COMMUNITY): Payer: PRIVATE HEALTH INSURANCE

## 2020-03-04 ENCOUNTER — Encounter: Payer: PRIVATE HEALTH INSURANCE | Admitting: Gastroenterology

## 2020-03-05 ENCOUNTER — Telehealth: Payer: Self-pay

## 2020-03-05 NOTE — Telephone Encounter (Signed)
**Note De-Identified Kearstyn Avitia Obfuscation** I started a Entresto PA through covermymeds. Key: [id number]

## 2020-03-05 NOTE — Telephone Encounter (Signed)
**Note De-Identified Edi Gorniak Obfuscation** Following message received from covermymeds: [name] Key: [facility] - PA Case ID: [id number] Outcome: Approved today PA Case: [id number] Coverage Starts on: 03/05/2020 12:00:00 AM, Coverage Ends on: 03/05/2021 12:00:00 AM. Drug Entresto 97-103MG  tablets Form [employer] 4-Part NCPDP Electronic PA Form   I have notified [facility] of this approval.

## 2020-03-13 ENCOUNTER — Other Ambulatory Visit: Payer: Self-pay | Admitting: Cardiology

## 2020-03-14 ENCOUNTER — Other Ambulatory Visit: Payer: Self-pay | Admitting: Family Medicine

## 2020-03-18 NOTE — Telephone Encounter (Signed)
Needs appointment

## 2020-03-19 NOTE — Telephone Encounter (Signed)
LVM for pt. Informed the medication he requested to be refilled  Has been sent to the pharmacy and he will need to call and schedule an appointment for future refills. I also reminded him that he will be due for his A1C in a couple of weeks. Ottis Stain, CMA

## 2020-04-02 ENCOUNTER — Telehealth (HOSPITAL_COMMUNITY): Payer: Self-pay | Admitting: Vascular Surgery

## 2020-04-02 NOTE — Telephone Encounter (Signed)
Returned pt call to reschedule canceled appt 9/2

## 2020-04-04 ENCOUNTER — Encounter (HOSPITAL_COMMUNITY): Payer: PRIVATE HEALTH INSURANCE

## 2020-04-05 ENCOUNTER — Ambulatory Visit: Payer: PRIVATE HEALTH INSURANCE | Admitting: Gastroenterology

## 2020-04-25 ENCOUNTER — Ambulatory Visit (HOSPITAL_COMMUNITY)
Admission: RE | Admit: 2020-04-25 | Discharge: 2020-04-25 | Disposition: A | Discharge status: Home / Self Care | Payer: 59 | Source: Ambulatory Visit | Attending: Adult Health | Admitting: Adult Health

## 2020-04-25 ENCOUNTER — Other Ambulatory Visit: Payer: Self-pay

## 2020-04-25 VITALS — BP 130/90 | HR 80 | Wt 199.8 lb

## 2020-04-25 DIAGNOSIS — I5022 Chronic systolic (congestive) heart failure: Secondary | ICD-10-CM

## 2020-04-25 DIAGNOSIS — E1165 Type 2 diabetes mellitus with hyperglycemia: Secondary | ICD-10-CM

## 2020-04-25 DIAGNOSIS — Z79899 Other long term (current) drug therapy: Secondary | ICD-10-CM | POA: Insufficient documentation

## 2020-04-25 DIAGNOSIS — N183 Chronic kidney disease, stage 3 unspecified: Secondary | ICD-10-CM | POA: Insufficient documentation

## 2020-04-25 DIAGNOSIS — I130 Hypertensive heart and chronic kidney disease with heart failure and stage 1 through stage 4 chronic kidney disease, or unspecified chronic kidney disease: Secondary | ICD-10-CM | POA: Diagnosis present

## 2020-04-25 DIAGNOSIS — E669 Obesity, unspecified: Secondary | ICD-10-CM | POA: Diagnosis not present

## 2020-04-25 DIAGNOSIS — E1122 Type 2 diabetes mellitus with diabetic chronic kidney disease: Secondary | ICD-10-CM | POA: Insufficient documentation

## 2020-04-25 DIAGNOSIS — I10 Essential (primary) hypertension: Secondary | ICD-10-CM

## 2020-04-25 DIAGNOSIS — Z880 Allergy status to penicillin: Secondary | ICD-10-CM | POA: Insufficient documentation

## 2020-04-25 DIAGNOSIS — I420 Dilated cardiomyopathy: Secondary | ICD-10-CM

## 2020-04-25 DIAGNOSIS — Z794 Long term (current) use of insulin: Secondary | ICD-10-CM | POA: Insufficient documentation

## 2020-04-25 DIAGNOSIS — I5020 Unspecified systolic (congestive) heart failure: Secondary | ICD-10-CM | POA: Diagnosis not present

## 2020-04-25 LAB — BASIC METABOLIC PANEL
Anion gap: 11 (ref 5–15)
BUN: 22 mg/dL — ABNORMAL HIGH (ref 6–20)
CO2: 22 mmol/L (ref 22–32)
Calcium: 9.2 mg/dL (ref 8.9–10.3)
Chloride: 105 mmol/L (ref 98–111)
Creatinine, Ser: 1.22 mg/dL (ref 0.61–1.24)
GFR calc Af Amer: >60 mL/min (ref >60)
GFR calc non Af Amer: >60 mL/min (ref >60)
Glucose, Bld: 113 mg/dL — ABNORMAL HIGH (ref 70–99)
Potassium: 4.2 mmol/L (ref 3.5–5.1)
Sodium: 138 mmol/L (ref 135–145)

## 2020-04-25 MED ORDER — SPIRONOLACTONE 25 MG PO TABS: 25.0000 mg | ORAL_TABLET | Freq: Every day | ORAL | 3 refills | Status: DC

## 2020-04-25 NOTE — Progress Notes (Signed)
[facility]  Primary Cardiologist: Marlou Porch HF MD: Dr Haroldine Laws  HPI: Nicholas Knight is a 49 y.o. 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 Family history of HTN.   Presented with new onset HF in 4/20. EF 10-15%. Right and left heart catheterization in April 2020 showing normal coronary arteries.  Repeat echocardiogram 02/14/19 EF 20 to 25%.  He has not been in the office since October 2020.   Today he returns for HF follow up.Overall feeling fine. Denies SOB/PND/Orthopnea. Appetite ok. No fever or chills. Weight at home  pounds. Taking all medications. Does not drink alcohol or smoke. Works full time as a Engineer, structural.   Past Medical History:  Diagnosis Date  . CHF (congestive heart failure) ([place])   . Diabetes mellitus type 2 in obese ([place]) 09/12/2015  . Diabetic ketoacidosis ([place]) 08/2019  . Exertional dyspnea   . Hypertension   . Nonischemic dilated cardiomyopathy ([facility])     Current Outpatient Medications  Medication Sig Dispense Refill  . atorvastatin (LIPITOR) 80 MG tablet TAKE 1 TABLET (80 MG TOTAL) BY MOUTH DAILY AT 6 PM. 90 tablet 3  . carvedilol (COREG) 25 MG tablet Take 1 tablet (25 mg total) by mouth 2 (two) times daily with a meal. 180 tablet 3  . empagliflozin (JARDIANCE) 10 MG TABS tablet Take 1 tablet (10 mg total) by mouth daily. 90 tablet 3  . furosemide (LASIX) 20 MG tablet Take 1 tablet (20 mg total) by mouth daily. 30 tablet 3  . Insulin Pen Needle (B-D UF III MINI PEN NEEDLES) 31G X 5 MM MISC USE TO INJECT INSULIN AT BED TIME 100 each 11  . LEVEMIR FLEXTOUCH 100 UNIT/ML FlexPen INJECT 30 UNITS INTO SKIN AT BEDTIME 15 mL 0  . metFORMIN (GLUCOPHAGE) 500 MG tablet Take 2 tablets (1,000 mg total) by mouth daily with breakfast. 60 tablet 11  . sacubitril-valsartan (ENTRESTO) 97-103 MG Take 1 tablet by mouth 2 (two) times daily. 60 tablet 6  . spironolactone (ALDACTONE) 25 MG  tablet Take 0.5 tablets (12.5 mg total) by mouth daily. 90 tablet 3   No current facility-administered medications for this encounter.    Allergies  Allergen Reactions  . Penicillins Other (See Comments)    Did it involve swelling of the face/tongue/throat, SOB, or low BP? N/A Did it involve sudden or severe rash/hives, skin peeling, or any reaction on the inside of your mouth or nose?N/A Did you need to seek medical attention at a hospital or doctor's office? N/A When did it last happen?Childhood  If all above answers are "NO", may proceed with cephalosporin use.      Social History   Socioeconomic History  . Marital status: Single    Spouse name: Not on file  . Number of children: Not on file  . Years of education: Not on file  . Highest education level: Not on file  Occupational History  . Not on file  Tobacco Use  . Smoking status: Never Smoker  . Smokeless tobacco: Never Used  Vaping Use  . Vaping Use: Never used  Substance and Sexual Activity  . Alcohol use: No  . Drug use: No  . Sexual activity: Yes  Other Topics Concern  . Not on file  Social History Narrative  . Not on file   Social Determinants of Health   Financial Resource Strain:   . Difficulty of Paying Living Expenses:  Not on file  Food Insecurity:   . Worried About Charity fundraiser in the Last Year: Not on file  . Ran Out of Food in the Last Year: Not on file  Transportation Needs:   . Lack of Transportation (Medical): Not on file  . Lack of Transportation (Non-Medical): Not on file  Physical Activity:   . Days of Exercise per Week: Not on file  . Minutes of Exercise per Session: Not on file  Stress:   . Feeling of Stress : Not on file  Social Connections:   . Frequency of Communication with Friends and Family: Not on file  . Frequency of Social Gatherings with Friends and Family: Not on file  . Attends Religious Services: Not on file  . Active Member of Clubs or Organizations: Not on  file  . Attends Archivist Meetings: Not on file  . Marital Status: Not on file  Intimate Partner Violence:   . Fear of Current or Ex-Partner: Not on file  . Emotionally Abused: Not on file  . Physically Abused: Not on file  . Sexually Abused: Not on file      Family History  Problem Relation Age of Onset  . Cancer Mother   . Lupus Maternal Grandmother    Vitals:   04/25/20 1344  BP: 130/90  Pulse: 80  SpO2: 98%  Weight: 90.6 kg (199 lb 12.8 oz)    PHYSICAL EXAM: General:  Well appearing. No resp difficulty HEENT: normal Neck: supple. no JVD. Carotids 2+ bilat; no bruits. No lymphadenopathy or thryomegaly appreciated. Cor: PMI nondisplaced. Regular rate & rhythm. No rubs, gallops or murmurs. Lungs: clear Abdomen: soft, nontender, nondistended. No hepatosplenomegaly. No bruits or masses. Good bowel sounds. Extremities: no cyanosis, clubbing, rash, edema Neuro: alert & orientedx3, cranial nerves grossly intact. moves all 4 extremities w/o difficulty. Affect pleasant  ECG: SR 80 bpm    ASSESSMENT & PLAN:  1. Chronic systolic HF due to NICM - onset 4/20. EF 15-20% cath with normal coronaries - echo 7/20 EF 20-25% -Suspect likely 2/2 long standing poorly controlled HTN - On goal carvedilol and entresto.  - Increase spiro 25 mg daily.  - Continue jardiance daily.  - Check BMET today and in 7 days.  - Repeat ECHO next week. IF EF remains low refer to EP. Narrow QRS.   2. HTN: Elevated. Increase spiro as noted above.   3. DM2. Followed by PCP -Consider future addition of an SGLT2i    Follow up with Dr Haroldine Laws in 3-4 months if EF remains low

## 2020-04-25 NOTE — Patient Instructions (Signed)
INCREASE Spironolactone 25mg  Daily  Labs done today, your results will be available in MyChart, we will contact you for abnormal readings.  Your physician recommends that you repeat labs in 7-10 days  Your physician recommends that you return in 7-10 days for an echocardiogram  Your physician has requested that you have an echocardiogram. Echocardiography is a painless test that uses sound waves to create images of your heart. It provides your doctor with information about the size and shape of your heart and how well your heart's chambers and valves are working. This procedure takes approximately one hour. There are no restrictions for this procedure.   Please follow up with our clinic in 3 months  If you have any questions or concerns before your next appointment please send [place] a message through [place] or call our office at [phone].    TO LEAVE A MESSAGE FOR THE NURSE SELECT OPTION 2, PLEASE LEAVE A MESSAGE INCLUDING: . YOUR NAME . DATE OF BIRTH . CALL BACK NUMBER . REASON FOR CALL**this is important as we prioritize the call backs  [place] AS LONG AS YOU CALL BEFORE 4:00 PM  At the [facility], you and your health needs are our priority. As part of our continuing mission to provide you with exceptional heart care, we have created designated Provider Care Teams. These Care Teams include your primary Cardiologist (physician) and Advanced Practice Providers (APPs- Physician Assistants and Nurse Practitioners) who all work together to provide you with the care you need, when you need it.   You may see any of the following providers on your designated Care Team at your next follow up: Marland Kitchen Dr Glori Bickers . Dr Loralie Champagne . Darrick Grinder, NP . Lyda Jester, PA . Audry Riles, PharmD   Please be sure to bring in all your medications bottles to every appointment.

## 2020-05-03 ENCOUNTER — Ambulatory Visit (HOSPITAL_COMMUNITY)
Admission: RE | Admit: 2020-05-03 | Discharge: 2020-05-03 | Disposition: A | Discharge status: Home / Self Care | Payer: 59 | Source: Ambulatory Visit | Attending: Pulmonary Disease | Admitting: Pulmonary Disease

## 2020-05-03 ENCOUNTER — Ambulatory Visit (HOSPITAL_COMMUNITY)
Admission: RE | Admit: 2020-05-03 | Discharge: 2020-05-03 | Disposition: A | Discharge status: Home / Self Care | Payer: 59 | Source: Ambulatory Visit | Attending: Adult Health | Admitting: Adult Health

## 2020-05-03 ENCOUNTER — Other Ambulatory Visit: Payer: Self-pay

## 2020-05-03 ENCOUNTER — Other Ambulatory Visit (HOSPITAL_COMMUNITY): Payer: Self-pay | Admitting: Internal Medicine

## 2020-05-03 DIAGNOSIS — I5020 Unspecified systolic (congestive) heart failure: Secondary | ICD-10-CM

## 2020-05-03 LAB — BASIC METABOLIC PANEL
Anion gap: 8 (ref 5–15)
BUN: 19 mg/dL (ref 6–20)
CO2: 26 mmol/L (ref 22–32)
Calcium: 9.2 mg/dL (ref 8.9–10.3)
Chloride: 104 mmol/L (ref 98–111)
Creatinine, Ser: 1.25 mg/dL — ABNORMAL HIGH (ref 0.61–1.24)
GFR calc Af Amer: >60 mL/min (ref >60)
GFR calc non Af Amer: >60 mL/min (ref >60)
Glucose, Bld: 169 mg/dL — ABNORMAL HIGH (ref 70–99)
Potassium: 4 mmol/L (ref 3.5–5.1)
Sodium: 138 mmol/L (ref 135–145)

## 2020-05-03 LAB — ECHOCARDIOGRAM COMPLETE
Area-P 1/2: 4.06 cm2
S' Lateral: 4.2 cm
Single Plane A2C EF: 43.5 %

## 2020-05-03 NOTE — Progress Notes (Signed)
  Echocardiogram 2D Echocardiogram has been performed.  Nicholas Knight 05/03/2020, 10:50 AM

## 2020-05-06 ENCOUNTER — Other Ambulatory Visit: Payer: Self-pay | Admitting: *Deleted

## 2020-05-07 MED ORDER — LEVEMIR FLEXTOUCH 100 UNIT/ML ~~LOC~~ SOPN
PEN_INJECTOR | SUBCUTANEOUS | 0 refills | Status: DC
Start: 2020-05-07 — End: 2020-05-14

## 2020-05-08 ENCOUNTER — Ambulatory Visit: Payer: 59 | Admitting: Family Medicine

## 2020-05-14 ENCOUNTER — Encounter: Payer: Self-pay | Admitting: Family Medicine

## 2020-05-14 ENCOUNTER — Other Ambulatory Visit: Payer: Self-pay

## 2020-05-14 ENCOUNTER — Ambulatory Visit (INDEPENDENT_AMBULATORY_CARE_PROVIDER_SITE_OTHER): Payer: 59 | Admitting: Family Medicine

## 2020-05-14 VITALS — BP 108/72 | HR 86 | Wt 198.0 lb

## 2020-05-14 DIAGNOSIS — E669 Obesity, unspecified: Secondary | ICD-10-CM | POA: Diagnosis not present

## 2020-05-14 DIAGNOSIS — Z1159 Encounter for screening for other viral diseases: Secondary | ICD-10-CM

## 2020-05-14 DIAGNOSIS — E1169 Type 2 diabetes mellitus with other specified complication: Secondary | ICD-10-CM | POA: Diagnosis not present

## 2020-05-14 DIAGNOSIS — Z125 Encounter for screening for malignant neoplasm of prostate: Secondary | ICD-10-CM | POA: Diagnosis not present

## 2020-05-14 LAB — POCT GLYCOSYLATED HEMOGLOBIN (HGB A1C): HbA1c, POC (controlled diabetic range): 7.6 % — AB (ref 0.0–7.0)

## 2020-05-14 MED ORDER — LEVEMIR FLEXTOUCH 100 UNIT/ML ~~LOC~~ SOPN
PEN_INJECTOR | SUBCUTANEOUS | 6 refills | Status: DC
Start: 2020-05-14 — End: 2021-07-02

## 2020-05-14 NOTE — Progress Notes (Signed)
    SUBJECTIVE:   CHIEF COMPLAINT / HPI:   DIABETES Disease Monitoring: Blood Sugar range-usally in 120s but does not check regularly   Medications Adherence knows his meds Hypoglycemic symptoms- no    Screening - he would like screened for prostate cancer   PERTINENT  PMH / PSH: works as Engineer, structural  OBJECTIVE:   BP 108/72   Pulse 86   Wt 198 lb (89.8 kg)   SpO2 98%   BMI 29.24 kg/m   Heart - Regular rate and rhythm.  No murmurs, gallops or rubs.    Lungs:  Normal respiratory effort, chest expands symmetrically. Lungs are clear to auscultation, no crackles or wheezes. Extremities:  No cyanosis, edema, or deformity noted with good range of motion of all major joints.     ASSESSMENT/PLAN:   Diabetes mellitus type 2 in obese (HCC) Slightly worse control.  We discussed and decided to continue same medications and he will continue to work on weight loss.   Come back in 3 months      Lind Covert, MD [place]

## 2020-05-14 NOTE — Patient Instructions (Addendum)
Good to see you today!  Thanks for coming in.  Follow through on your colonscopy  We will check a PSA and I will send you a letter   Continue medications as you are.  Work on losing about 2 lb a month.  We will recheck your A1c in 3 months

## 2020-05-14 NOTE — Assessment & Plan Note (Signed)
Slightly worse control.  We discussed and decided to continue same medications and he will continue to work on weight loss.   Come back in 3 months

## 2020-05-15 LAB — HEPATITIS C ANTIBODY: Hep C Virus Ab: 0.1 s/co ratio (ref 0.0–0.9)

## 2020-05-15 LAB — PSA: Prostate Specific Ag, Serum: 9.4 ng/mL — ABNORMAL HIGH (ref 0.0–4.0)

## 2020-05-16 ENCOUNTER — Telehealth: Payer: Self-pay | Admitting: Family Medicine

## 2020-05-16 DIAGNOSIS — R972 Elevated prostate specific antigen [PSA]: Secondary | ICD-10-CM | POA: Insufficient documentation

## 2020-05-16 NOTE — Assessment & Plan Note (Signed)
PSA 9.4.  Has not had before.  Is asymptomatic Will refer to urology

## 2020-05-16 NOTE — Telephone Encounter (Signed)
Called about his PSA elevation Recommend referral to urology and he agrees  Asked him to My chart or call if questions  If he has not heard from urology in 2 weeks to call us

## 2020-05-22 ENCOUNTER — Ambulatory Visit: Payer: 59 | Admitting: Family Medicine

## 2020-06-03 ENCOUNTER — Telehealth (HOSPITAL_COMMUNITY): Payer: Self-pay | Admitting: Internal Medicine

## 2020-06-04 ENCOUNTER — Encounter (HOSPITAL_COMMUNITY): Payer: 59

## 2020-06-25 ENCOUNTER — Encounter (HOSPITAL_COMMUNITY): Payer: 59

## 2020-07-25 ENCOUNTER — Encounter (HOSPITAL_COMMUNITY): Payer: PRIVATE HEALTH INSURANCE | Admitting: Internal Medicine

## 2020-08-15 ENCOUNTER — Other Ambulatory Visit (HOSPITAL_COMMUNITY): Payer: Self-pay | Admitting: Internal Medicine

## 2020-08-15 DIAGNOSIS — E1165 Type 2 diabetes mellitus with hyperglycemia: Secondary | ICD-10-CM

## 2020-09-03 ENCOUNTER — Other Ambulatory Visit (HOSPITAL_COMMUNITY): Payer: Self-pay | Admitting: Adult Health

## 2020-09-11 ENCOUNTER — Encounter (HOSPITAL_COMMUNITY): Payer: PRIVATE HEALTH INSURANCE | Admitting: Internal Medicine

## 2020-09-17 ENCOUNTER — Other Ambulatory Visit: Payer: Self-pay | Admitting: Family Medicine

## 2020-09-17 DIAGNOSIS — I5020 Unspecified systolic (congestive) heart failure: Secondary | ICD-10-CM

## 2020-09-17 DIAGNOSIS — E1165 Type 2 diabetes mellitus with hyperglycemia: Secondary | ICD-10-CM

## 2020-09-18 NOTE — Telephone Encounter (Signed)
Needs appointment

## 2020-09-23 NOTE — Telephone Encounter (Signed)
Called and lvm for patient to call back and schedule appointment.  

## 2020-09-30 ENCOUNTER — Other Ambulatory Visit (HOSPITAL_COMMUNITY): Payer: Self-pay | Admitting: Internal Medicine

## 2020-10-17 ENCOUNTER — Ambulatory Visit (INDEPENDENT_AMBULATORY_CARE_PROVIDER_SITE_OTHER): Payer: 59 | Admitting: Family Medicine

## 2020-10-17 ENCOUNTER — Encounter: Payer: Self-pay | Admitting: Family Medicine

## 2020-10-17 ENCOUNTER — Other Ambulatory Visit: Payer: Self-pay

## 2020-10-17 VITALS — BP 118/84 | HR 97 | Ht 70.08 in | Wt 205.8 lb

## 2020-10-17 DIAGNOSIS — I10 Essential (primary) hypertension: Secondary | ICD-10-CM | POA: Diagnosis not present

## 2020-10-17 DIAGNOSIS — I5020 Unspecified systolic (congestive) heart failure: Secondary | ICD-10-CM | POA: Diagnosis not present

## 2020-10-17 DIAGNOSIS — Z1211 Encounter for screening for malignant neoplasm of colon: Secondary | ICD-10-CM

## 2020-10-17 DIAGNOSIS — E119 Type 2 diabetes mellitus without complications: Secondary | ICD-10-CM

## 2020-10-17 LAB — POCT GLYCOSYLATED HEMOGLOBIN (HGB A1C): HbA1c, POC (controlled diabetic range): 7.2 % — AB (ref 0.0–7.0)

## 2020-10-17 MED ORDER — FUROSEMIDE 20 MG PO TABS: 20.0000 mg | ORAL_TABLET | Freq: Every day | ORAL | 1 refills | Status: DC

## 2020-10-17 NOTE — Assessment & Plan Note (Signed)
A1c 7.2 today.  Continue with current regimen.  Obtain lipid panel and BMP.  He is on Entresto and Lipitor.  Could consider working to discontinue his Levemir and start a GLP-1 in the future.  Follow-up in 3 months.  Advised to schedule an eye exam.  Foot exam performed today, no abnormalities.

## 2020-10-17 NOTE — Assessment & Plan Note (Signed)
Well-controlled today.  Labs per above.  Continue current regimen of Coreg, Lasix, Entresto, spironolactone.  Follow-up in 3 months.

## 2020-10-17 NOTE — Progress Notes (Signed)
    SUBJECTIVE:   CHIEF COMPLAINT / HPI:   T2DM with HLD Current regimen: Jardiance 10 mg daily, Metformin 1000 mg daily, Levemir 30 units nightly CBGs 90s-101 On Entresto and Lipitor Last A1c 7.6 on 05/14/2020 Last BMP 05/03/2020, creatinine 1.25 at that time, seems to be stable Last lipid panel 12/15/2018, LDL 64 at that time Eye exam, none recently   HTN Current regimen: Coreg 25 mg twice daily, Lasix 20 mg daily, Entresto 97-23 mg twice daily, spironolactone 25 mg daily BPs at home, doesn't check  HFrEF Follows with cardiology Last EF 30 to 35% on 05/03/2020 Has not been seen with cardiology since September 2021 On Coreg, Lasix, Entresto, spironolactone per above Denies chest pain, shortness of breath, leg edema States that he is still peeing well with his diuretic  Colonoscopy due, has not had one  PERTINENT  PMH / PSH: HTN, nonischemic dilated cardiomyopathy, HFrEF, T2DM, HLD  OBJECTIVE:   BP 118/84   Pulse 97   Ht 5' 10.08" (1.78 m)   Wt 205 lb 12.8 oz (93.4 kg)   SpO2 98%   BMI 29.46 kg/m    Physical Exam:  General: 50 y.o. male in NAD Cardio: RRR no m/r/g Lungs: CTAB, no wheezing, no rhonchi, no crackles, no IWOB on RA Skin: warm and dry Extremities: No edema  Diabetic Foot Check -  Appearance - no lesions, ulcers or calluses Skin - no unusual pallor or redness Monofilament testing - normal bilaterally  Right - Great toe, medial, central, lateral ball and posterior foot intact Left - Great toe, medial, central, lateral ball and posterior foot intact   Results for orders placed or performed in visit on 10/17/20 (from the past 24 hour(s))  HgB A1c     Status: Abnormal   Collection Time: 10/17/20 10:57 AM  Result Value Ref Range   Hemoglobin A1C     HbA1c POC (<> result, manual entry)     HbA1c, POC (prediabetic range)     HbA1c, POC (controlled diabetic range) 7.2 (A) 0.0 - 7.0 %     ASSESSMENT/PLAN:   Diabetes mellitus without complication  (HCC) 123456 7.2 today.  Continue with current regimen.  Obtain lipid panel and BMP.  He is on Entresto and Lipitor.  Could consider working to discontinue his Levemir and start a GLP-1 in the future.  Follow-up in 3 months.  Advised to schedule an eye exam.  Foot exam performed today, no abnormalities.  Heart failure with reduced ejection fraction (HCC) EF is now 30 to 35%.  Advised that he needs to follow-up with cardiology per their last note.  He will call to schedule an appointment.  Continue Coreg, Lasix, Entresto, spironolactone.  No signs of fluid overload.  Hypertension Well-controlled today.  Labs per above.  Continue current regimen of Coreg, Lasix, Entresto, spironolactone.  Follow-up in 3 months.   Colonoscopy needed and ordered.  Given colonoscopy form.  Nicholas Knight, [place]

## 2020-10-17 NOTE — Patient Instructions (Signed)
Thank you for coming to see me today. It was a pleasure. Today we talked about:   We will get some labs today.  If they are abnormal or we need to do something about them, I will call you.  If they are normal, I will send you a message on MyChart (if it is active) or a letter in the mail.  If you don't hear from [place] in 2 weeks, please call the office at the number below.  You are due for a colonoscopy.  Please use the form that we have given you to schedule this at your convenience.   You are due for an eye exam.  Please make sure that you call your eye doctor to have this scheduled and have them fax the results to our office.  Please follow-up with me in 3 months.  If you have any questions or concerns, please do not hesitate to call the office at [phone].  Best,   [place] Constable, DO

## 2020-10-17 NOTE — Assessment & Plan Note (Signed)
EF is now 30 to 35%.  Advised that he needs to follow-up with cardiology per their last note.  He will call to schedule an appointment.  Continue Coreg, Lasix, Entresto, spironolactone.  No signs of fluid overload.

## 2020-10-18 LAB — BASIC METABOLIC PANEL
BUN/Creatinine Ratio: 18 (ref 9–20)
BUN: 22 mg/dL (ref 6–24)
CO2: 21 mmol/L (ref 20–29)
Calcium: 9.2 mg/dL (ref 8.7–10.2)
Chloride: 105 mmol/L (ref 96–106)
Creatinine, Ser: 1.21 mg/dL (ref 0.76–1.27)
Glucose: 160 mg/dL — ABNORMAL HIGH (ref 65–99)
Potassium: 4.4 mmol/L (ref 3.5–5.2)
Sodium: 141 mmol/L (ref 134–144)
eGFR: 72 mL/min/{1.73_m2} (ref >59)

## 2020-10-18 LAB — LIPID PANEL
Chol/HDL Ratio: 3.6 ratio (ref 0.0–5.0)
Cholesterol, Total: 109 mg/dL (ref 100–199)
HDL: 30 mg/dL — ABNORMAL LOW (ref >39)
LDL Chol Calc (NIH): 50 mg/dL (ref 0–99)
Triglycerides: 171 mg/dL — ABNORMAL HIGH (ref 0–149)
VLDL Cholesterol Cal: 29 mg/dL (ref 5–40)

## 2020-10-24 ENCOUNTER — Other Ambulatory Visit: Payer: Self-pay | Admitting: Family Medicine

## 2020-10-24 DIAGNOSIS — E1165 Type 2 diabetes mellitus with hyperglycemia: Secondary | ICD-10-CM

## 2020-11-26 ENCOUNTER — Other Ambulatory Visit: Payer: Self-pay | Admitting: Family Medicine

## 2020-11-26 DIAGNOSIS — I5020 Unspecified systolic (congestive) heart failure: Secondary | ICD-10-CM

## 2020-11-26 DIAGNOSIS — E119 Type 2 diabetes mellitus without complications: Secondary | ICD-10-CM

## 2021-01-03 ENCOUNTER — Other Ambulatory Visit (HOSPITAL_COMMUNITY): Payer: Self-pay

## 2021-01-03 MED ORDER — SACUBITRIL-VALSARTAN 97-103 MG PO TABS: 1.0000 | ORAL_TABLET | Freq: Two times a day (BID) | ORAL | 0 refills | Status: DC

## 2021-01-29 ENCOUNTER — Encounter (HOSPITAL_COMMUNITY): Payer: Self-pay | Admitting: Cardiology

## 2021-01-29 ENCOUNTER — Other Ambulatory Visit (HOSPITAL_COMMUNITY): Payer: Self-pay | Admitting: Internal Medicine

## 2021-03-01 ENCOUNTER — Other Ambulatory Visit: Payer: Self-pay | Admitting: Cardiology

## 2021-03-26 ENCOUNTER — Encounter: Payer: Self-pay | Admitting: Gastroenterology

## 2021-04-16 ENCOUNTER — Other Ambulatory Visit: Payer: Self-pay | Admitting: Family Medicine

## 2021-04-16 DIAGNOSIS — I5020 Unspecified systolic (congestive) heart failure: Secondary | ICD-10-CM

## 2021-05-02 ENCOUNTER — Other Ambulatory Visit: Payer: Self-pay | Admitting: Family Medicine

## 2021-05-02 DIAGNOSIS — E1165 Type 2 diabetes mellitus with hyperglycemia: Secondary | ICD-10-CM

## 2021-05-22 ENCOUNTER — Encounter: Payer: 59 | Admitting: Gastroenterology

## 2021-05-25 ENCOUNTER — Other Ambulatory Visit (HOSPITAL_COMMUNITY): Payer: Self-pay | Admitting: Internal Medicine

## 2021-05-29 ENCOUNTER — Ambulatory Visit: Payer: 59 | Admitting: Nurse Practitioner

## 2021-06-17 ENCOUNTER — Other Ambulatory Visit (HOSPITAL_COMMUNITY): Payer: Self-pay | Admitting: Adult Health

## 2021-06-17 ENCOUNTER — Ambulatory Visit: Payer: 59 | Admitting: Nurse Practitioner

## 2021-07-01 ENCOUNTER — Other Ambulatory Visit: Payer: Self-pay | Admitting: Family Medicine

## 2021-08-06 ENCOUNTER — Other Ambulatory Visit: Payer: Self-pay | Admitting: Family Medicine

## 2021-08-06 DIAGNOSIS — I5020 Unspecified systolic (congestive) heart failure: Secondary | ICD-10-CM

## 2021-08-27 ENCOUNTER — Other Ambulatory Visit (HOSPITAL_COMMUNITY): Payer: Self-pay | Admitting: Internal Medicine

## 2021-09-10 ENCOUNTER — Other Ambulatory Visit: Payer: Self-pay | Admitting: Family Medicine

## 2022-01-06 ENCOUNTER — Encounter: Payer: Self-pay | Admitting: *Deleted

## 2022-07-09 ENCOUNTER — Other Ambulatory Visit: Payer: Self-pay

## 2022-07-09 ENCOUNTER — Inpatient Hospital Stay (HOSPITAL_COMMUNITY)
Admission: EM | Admit: 2022-07-09 | Discharge: 2022-07-13 | DRG: 638 | Disposition: A | Discharge status: Home / Self Care | Payer: No Typology Code available for payment source | Attending: Internal Medicine | Admitting: Internal Medicine

## 2022-07-09 ENCOUNTER — Inpatient Hospital Stay (HOSPITAL_COMMUNITY): Payer: No Typology Code available for payment source

## 2022-07-09 DIAGNOSIS — I10 Essential (primary) hypertension: Secondary | ICD-10-CM | POA: Diagnosis present

## 2022-07-09 DIAGNOSIS — I959 Hypotension, unspecified: Secondary | ICD-10-CM | POA: Diagnosis present

## 2022-07-09 DIAGNOSIS — T383X6A Underdosing of insulin and oral hypoglycemic [antidiabetic] drugs, initial encounter: Secondary | ICD-10-CM | POA: Diagnosis present

## 2022-07-09 DIAGNOSIS — N179 Acute kidney failure, unspecified: Secondary | ICD-10-CM | POA: Diagnosis present

## 2022-07-09 DIAGNOSIS — E782 Mixed hyperlipidemia: Secondary | ICD-10-CM | POA: Diagnosis not present

## 2022-07-09 DIAGNOSIS — I5022 Chronic systolic (congestive) heart failure: Secondary | ICD-10-CM | POA: Diagnosis present

## 2022-07-09 DIAGNOSIS — E119 Type 2 diabetes mellitus without complications: Secondary | ICD-10-CM

## 2022-07-09 DIAGNOSIS — E0810 Diabetes mellitus due to underlying condition with ketoacidosis without coma: Principal | ICD-10-CM

## 2022-07-09 DIAGNOSIS — Z91138 Patient's unintentional underdosing of medication regimen for other reason: Secondary | ICD-10-CM | POA: Diagnosis not present

## 2022-07-09 DIAGNOSIS — N3000 Acute cystitis without hematuria: Secondary | ICD-10-CM | POA: Diagnosis not present

## 2022-07-09 DIAGNOSIS — I420 Dilated cardiomyopathy: Secondary | ICD-10-CM | POA: Diagnosis present

## 2022-07-09 DIAGNOSIS — E1110 Type 2 diabetes mellitus with ketoacidosis without coma: Secondary | ICD-10-CM | POA: Diagnosis present

## 2022-07-09 DIAGNOSIS — E785 Hyperlipidemia, unspecified: Secondary | ICD-10-CM | POA: Diagnosis present

## 2022-07-09 DIAGNOSIS — Z7984 Long term (current) use of oral hypoglycemic drugs: Secondary | ICD-10-CM | POA: Diagnosis not present

## 2022-07-09 DIAGNOSIS — E876 Hypokalemia: Secondary | ICD-10-CM | POA: Diagnosis not present

## 2022-07-09 DIAGNOSIS — Z794 Long term (current) use of insulin: Secondary | ICD-10-CM | POA: Diagnosis not present

## 2022-07-09 DIAGNOSIS — I255 Ischemic cardiomyopathy: Secondary | ICD-10-CM | POA: Diagnosis present

## 2022-07-09 DIAGNOSIS — Z880 Allergy status to penicillin: Secondary | ICD-10-CM | POA: Diagnosis not present

## 2022-07-09 DIAGNOSIS — R112 Nausea with vomiting, unspecified: Secondary | ICD-10-CM | POA: Diagnosis present

## 2022-07-09 DIAGNOSIS — Z79899 Other long term (current) drug therapy: Secondary | ICD-10-CM

## 2022-07-09 DIAGNOSIS — I5020 Unspecified systolic (congestive) heart failure: Secondary | ICD-10-CM | POA: Diagnosis present

## 2022-07-09 DIAGNOSIS — N390 Urinary tract infection, site not specified: Secondary | ICD-10-CM | POA: Diagnosis present

## 2022-07-09 DIAGNOSIS — I428 Other cardiomyopathies: Secondary | ICD-10-CM | POA: Diagnosis not present

## 2022-07-09 DIAGNOSIS — I110 Hypertensive heart disease with heart failure: Secondary | ICD-10-CM | POA: Diagnosis present

## 2022-07-09 DIAGNOSIS — R7989 Other specified abnormal findings of blood chemistry: Secondary | ICD-10-CM | POA: Diagnosis not present

## 2022-07-09 DIAGNOSIS — E860 Dehydration: Secondary | ICD-10-CM | POA: Diagnosis present

## 2022-07-09 DIAGNOSIS — E1165 Type 2 diabetes mellitus with hyperglycemia: Secondary | ICD-10-CM

## 2022-07-09 LAB — COMPREHENSIVE METABOLIC PANEL
ALT: 28 U/L (ref 0–44)
AST: 20 U/L (ref 15–41)
Albumin: 3.8 g/dL (ref 3.5–5.0)
Alkaline Phosphatase: 93 U/L (ref 38–126)
Anion gap: 29 — ABNORMAL HIGH (ref 5–15)
BUN: 39 mg/dL — ABNORMAL HIGH (ref 6–20)
CO2: 9 mmol/L — ABNORMAL LOW (ref 22–32)
Calcium: 8.9 mg/dL (ref 8.9–10.3)
Chloride: 93 mmol/L — ABNORMAL LOW (ref 98–111)
Creatinine, Ser: 2.39 mg/dL — ABNORMAL HIGH (ref 0.61–1.24)
GFR, Estimated: 32 mL/min — ABNORMAL LOW (ref >60)
Glucose, Bld: 597 mg/dL (ref 70–99)
Potassium: 4.5 mmol/L (ref 3.5–5.1)
Sodium: 131 mmol/L — ABNORMAL LOW (ref 135–145)
Total Bilirubin: 1.4 mg/dL — ABNORMAL HIGH (ref 0.3–1.2)
Total Protein: 7.8 g/dL (ref 6.5–8.1)

## 2022-07-09 LAB — BLOOD GAS, VENOUS
Acid-base deficit: 26 mmol/L — ABNORMAL HIGH (ref 0.0–2.0)
Bicarbonate: 8 mmol/L — ABNORMAL LOW (ref 20.0–28.0)
O2 Saturation: 31.5 %
Patient temperature: 37
pCO2, Ven: 45 mmHg (ref 44–60)
pH, Ven: <6.95 — CL (ref 7.25–7.43)
pO2, Ven: <31 mmHg — CL (ref 32–45)

## 2022-07-09 LAB — CBG MONITORING, ED
Glucose-Capillary: 376 mg/dL — ABNORMAL HIGH (ref 70–99)
Glucose-Capillary: 499 mg/dL — ABNORMAL HIGH (ref 70–99)
Glucose-Capillary: 570 mg/dL (ref 70–99)
Glucose-Capillary: >600 mg/dL (ref 70–99)
Glucose-Capillary: >600 mg/dL (ref 70–99)
Glucose-Capillary: >600 mg/dL (ref 70–99)
Glucose-Capillary: >600 mg/dL (ref 70–99)
Glucose-Capillary: >600 mg/dL (ref 70–99)
Glucose-Capillary: >600 mg/dL (ref 70–99)
Glucose-Capillary: >600 mg/dL (ref 70–99)
Glucose-Capillary: >600 mg/dL (ref 70–99)

## 2022-07-09 LAB — CBC
HCT: 59.8 % — ABNORMAL HIGH (ref 39.0–52.0)
Hemoglobin: 18.7 g/dL — ABNORMAL HIGH (ref 13.0–17.0)
MCH: 26.6 pg (ref 26.0–34.0)
MCHC: 31.3 g/dL (ref 30.0–36.0)
MCV: 85.1 fL (ref 80.0–100.0)
Platelets: 426 10*3/uL — ABNORMAL HIGH (ref 150–400)
RBC: 7.03 MIL/uL — ABNORMAL HIGH (ref 4.22–5.81)
RDW: 14.7 % (ref 11.5–15.5)
WBC: 13.6 10*3/uL — ABNORMAL HIGH (ref 4.0–10.5)
nRBC: 0 % (ref 0.0–0.2)

## 2022-07-09 LAB — URINALYSIS, ROUTINE W REFLEX MICROSCOPIC
Bilirubin Urine: NEGATIVE
Glucose, UA: >=500 mg/dL — AB
Ketones, ur: 80 mg/dL — AB
Nitrite: NEGATIVE
Protein, ur: 100 mg/dL — AB
Specific Gravity, Urine: 1.023 (ref 1.005–1.030)
WBC, UA: >50 WBC/hpf — ABNORMAL HIGH (ref 0–5)
pH: 5 (ref 5.0–8.0)

## 2022-07-09 LAB — LIPASE, BLOOD: Lipase: 50 U/L (ref 11–51)

## 2022-07-09 LAB — BETA-HYDROXYBUTYRIC ACID: Beta-Hydroxybutyric Acid: >8 mmol/L — ABNORMAL HIGH (ref 0.05–0.27)

## 2022-07-09 MED ORDER — DEXTROSE IN LACTATED RINGERS 5 % IV SOLN: INTRAVENOUS | Status: DC

## 2022-07-09 MED ORDER — LACTATED RINGERS IV BOLUS
1000.0000 mL | INTRAVENOUS | Status: AC
  Administered 2022-07-09: 1000 mL via INTRAVENOUS

## 2022-07-09 MED ORDER — PROCHLORPERAZINE EDISYLATE 10 MG/2ML IJ SOLN
10.0000 mg | Freq: Four times a day (QID) | INTRAMUSCULAR | Status: DC | PRN
  Administered 2022-07-10 – 2022-07-11 (×2): 10 mg via INTRAVENOUS
  Filled 2022-07-09 (×2): qty 2

## 2022-07-09 MED ORDER — LACTATED RINGERS IV SOLN: INTRAVENOUS | Status: DC

## 2022-07-09 MED ORDER — DEXTROSE 50 % IV SOLN
0.0000 mL | INTRAVENOUS | Status: DC | PRN
  Filled 2022-07-09: qty 50

## 2022-07-09 MED ORDER — INSULIN REGULAR(HUMAN) IN NACL 100-0.9 UT/100ML-% IV SOLN
INTRAVENOUS | Status: DC
  Administered 2022-07-09: 11 [IU]/h via INTRAVENOUS
  Administered 2022-07-10: 8.5 [IU]/h via INTRAVENOUS
  Filled 2022-07-09 (×2): qty 100

## 2022-07-09 NOTE — ED Provider Notes (Signed)
[facility] DEPT Provider Note   CSN: [id number] Arrival date & time: 07/09/22  1316     History  Chief Complaint  Patient presents with   Emesis    Nicholas Knight is a 51 y.o. male.  He has a history of diabetes hypertensi heart failure with reduced ejection fraction.  He said he has been out of his diabetes medication for months.  Complaining of feeling sick for a few weeks nausea no vomiting.  Feeling generally weak.  The history is provided by the patient.  Hyperglycemia Blood sugar level PTA:  >600 Severity:  Severe Onset quality:  Unable to specify Timing:  Unable to specify Progression:  Unchanged Chronicity:  New Diabetes status:  Controlled with oral medications Context: noncompliance   Relieved by:  Nothing Ineffective treatments:  None tried Associated symptoms: dehydration, increased thirst and nausea   Associated symptoms: no abdominal pain, no fever and no vomiting        Home Medications Prior to Admission medications   Medication Sig Start Date End Date Taking? Authorizing Provider  atorvastatin (LIPITOR) 80 MG tablet TAKE 1 TABLET (80 MG TOTAL) BY MOUTH DAILY AT 6 PM. 03/03/21   Jerline Pain, MD  carvedilol (COREG) 25 MG tablet Take 1 tablet (25 mg total) by mouth 2 (two) times daily with a meal. LAST REFILL NEEDS APPOINTMENT 08/27/21   Bensimhon, Shaune Pascal, MD  furosemide (LASIX) 20 MG tablet TAKE 1 TABLET BY MOUTH EVERY DAY 08/06/21   Lurline Del, DO  Insulin Pen Needle (B-D UF III MINI PEN NEEDLES) 31G X 5 MM MISC USE TO INJECT INSULIN AT BED TIME 10/19/19   Meccariello, Bernita Raisin, MD  JARDIANCE 10 MG TABS tablet TAKE 1 TABLET BY MOUTH EVERY DAY 11/26/20   Meccariello, Bernita Raisin, MD  LEVEMIR FLEXTOUCH 100 UNIT/ML FlexTouch Pen INJECT 30 UNITS INTO SKIN AT BEDTIME 09/11/21   Welborn, Ryan, DO  metFORMIN (GLUCOPHAGE) 500 MG tablet TAKE 2 TABLETS BY MOUTH EVERY DAY WITH BREAKFAST 05/04/21   Welborn, Ryan, DO  sacubitril-valsartan  (ENTRESTO) 97-103 MG Take 1 tablet by mouth in the morning and at bedtime. Please call for office visit for additional refills . [phone] 01/29/21   Bensimhon, Shaune Pascal, MD  spironolactone (ALDACTONE) 25 MG tablet TAKE 1 TABLET (25 MG TOTAL) BY MOUTH DAILY. 06/17/21   Bensimhon, Shaune Pascal, MD      Allergies    Penicillins    Review of Systems   Review of Systems  Constitutional:  Negative for fever.  Gastrointestinal:  Positive for nausea. Negative for abdominal pain and vomiting.  Endocrine: Positive for polydipsia.    Physical Exam Updated Vital Signs BP 130/80 (BP Location: Right Arm)   Pulse 68   Temp 98 F (36.7 C) (Oral)   Resp 16   SpO2 99%  Physical Exam Vitals and nursing note reviewed.  Constitutional:      General: He is in acute distress.     Appearance: Normal appearance. He is well-developed.  HENT:     Head: Normocephalic and atraumatic.  Eyes:     Conjunctiva/sclera: Conjunctivae normal.  Cardiovascular:     Rate and Rhythm: Regular rhythm. Tachycardia present.     Pulses: Normal pulses.     Heart sounds: No murmur heard. Pulmonary:     Effort: Tachypnea and accessory muscle usage present. No respiratory distress.     Breath sounds: Normal breath sounds.  Abdominal:     Palpations: Abdomen is soft.  Tenderness: There is no abdominal tenderness. There is no guarding or rebound.  Musculoskeletal:        General: Normal range of motion.     Cervical back: Neck supple.     Right lower leg: No edema.     Left lower leg: No edema.  Skin:    General: Skin is warm and dry.     Capillary Refill: Capillary refill takes less than 2 seconds.  Neurological:     General: No focal deficit present.     Mental Status: He is alert.     ED Results / Procedures / Treatments   Labs (all labs ordered are listed, but only abnormal results are displayed) Labs Reviewed  CBC - Abnormal; Notable for the following components:      Result Value   WBC 13.6 (*)     RBC 7.03 (*)    Hemoglobin 18.7 (*)    HCT 59.8 (*)    Platelets 426 (*)    All other components within normal limits  URINALYSIS, ROUTINE W REFLEX MICROSCOPIC - Abnormal; Notable for the following components:   APPearance HAZY (*)    Glucose, UA >=500 (*)    Hgb urine dipstick SMALL (*)    Ketones, ur 80 (*)    Protein, ur 100 (*)    Leukocytes,Ua MODERATE (*)    WBC, UA >50 (*)    Bacteria, UA RARE (*)    All other components within normal limits  COMPREHENSIVE METABOLIC PANEL - Abnormal; Notable for the following components:   Sodium 131 (*)    Chloride 93 (*)    CO2 9 (*)    Glucose, Bld 597 (*)    BUN 39 (*)    Creatinine, Ser 2.39 (*)    Total Bilirubin 1.4 (*)    GFR, Estimated 32 (*)    Anion gap 29 (*)    All other components within normal limits  BETA-HYDROXYBUTYRIC ACID - Abnormal; Notable for the following components:   Beta-Hydroxybutyric Acid >8.00 (*)    All other components within normal limits  BLOOD GAS, VENOUS - Abnormal; Notable for the following components:   pH, Ven <6.95 (*)    pO2, Ven <31 (*)    Bicarbonate 8.0 (*)    Acid-base deficit 26.0 (*)    All other components within normal limits  BASIC METABOLIC PANEL - Abnormal; Notable for the following components:   CO2 16 (*)    Glucose, Bld 232 (*)    BUN 28 (*)    Creatinine, Ser 1.43 (*)    Calcium 8.4 (*)    GFR, Estimated 59 (*)    All other components within normal limits  BETA-HYDROXYBUTYRIC ACID - Abnormal; Notable for the following components:   Beta-Hydroxybutyric Acid 3.50 (*)    All other components within normal limits  CBC - Abnormal; Notable for the following components:   WBC 13.9 (*)    All other components within normal limits  BASIC METABOLIC PANEL - Abnormal; Notable for the following components:   Sodium 133 (*)    CO2 14 (*)    Glucose, Bld 204 (*)    BUN 25 (*)    Creatinine, Ser 1.30 (*)    Calcium 8.2 (*)    All other components within normal limits  GLUCOSE,  CAPILLARY - Abnormal; Notable for the following components:   Glucose-Capillary 206 (*)    All other components within normal limits  GLUCOSE, CAPILLARY - Abnormal; Notable for the following components:  Glucose-Capillary 179 (*)    All other components within normal limits  GLUCOSE, CAPILLARY - Abnormal; Notable for the following components:   Glucose-Capillary 185 (*)    All other components within normal limits  GLUCOSE, CAPILLARY - Abnormal; Notable for the following components:   Glucose-Capillary 217 (*)    All other components within normal limits  GLUCOSE, CAPILLARY - Abnormal; Notable for the following components:   Glucose-Capillary 301 (*)    All other components within normal limits  GLUCOSE, CAPILLARY - Abnormal; Notable for the following components:   Glucose-Capillary 230 (*)    All other components within normal limits  GLUCOSE, CAPILLARY - Abnormal; Notable for the following components:   Glucose-Capillary 191 (*)    All other components within normal limits  GLUCOSE, CAPILLARY - Abnormal; Notable for the following components:   Glucose-Capillary 203 (*)    All other components within normal limits  GLUCOSE, CAPILLARY - Abnormal; Notable for the following components:   Glucose-Capillary 194 (*)    All other components within normal limits  CBG MONITORING, ED - Abnormal; Notable for the following components:   Glucose-Capillary >600 (*)    All other components within normal limits  CBG MONITORING, ED - Abnormal; Notable for the following components:   Glucose-Capillary >600 (*)    All other components within normal limits  CBG MONITORING, ED - Abnormal; Notable for the following components:   Glucose-Capillary >600 (*)    All other components within normal limits  CBG MONITORING, ED - Abnormal; Notable for the following components:   Glucose-Capillary >600 (*)    All other components within normal limits  CBG MONITORING, ED - Abnormal; Notable for the following  components:   Glucose-Capillary >600 (*)    All other components within normal limits  CBG MONITORING, ED - Abnormal; Notable for the following components:   Glucose-Capillary >600 (*)    All other components within normal limits  CBG MONITORING, ED - Abnormal; Notable for the following components:   Glucose-Capillary >600 (*)    All other components within normal limits  CBG MONITORING, ED - Abnormal; Notable for the following components:   Glucose-Capillary >600 (*)    All other components within normal limits  CBG MONITORING, ED - Abnormal; Notable for the following components:   Glucose-Capillary 570 (*)    All other components within normal limits  CBG MONITORING, ED - Abnormal; Notable for the following components:   Glucose-Capillary 499 (*)    All other components within normal limits  CBG MONITORING, ED - Abnormal; Notable for the following components:   Glucose-Capillary 376 (*)    All other components within normal limits  MRSA NEXT GEN BY PCR, NASAL  URINE CULTURE  CULTURE, BLOOD (ROUTINE X 2)  CULTURE, BLOOD (ROUTINE X 2)  LIPASE, BLOOD  HIV ANTIBODY (ROUTINE TESTING W REFLEX)  BASIC METABOLIC PANEL  BASIC METABOLIC PANEL  BASIC METABOLIC PANEL  BETA-HYDROXYBUTYRIC ACID  BETA-HYDROXYBUTYRIC ACID  HEMOGLOBIN A1C  I-STAT VENOUS BLOOD GAS, ED    EKG None  Radiology DG CHEST PORT 1 VIEW  Result Date: 07/10/2022 CLINICAL DATA:  Leukocytosis EXAM: PORTABLE CHEST 1 VIEW COMPARISON:  Chest x-ray dated November 26, 2018 FINDINGS: Heart size and mediastinal contours are within normal limits for AP technique. Both lungs are clear. The visualized skeletal structures are unremarkable. IMPRESSION: No active disease. Electronically Signed   By: Yetta Glassman M.D.   On: 07/10/2022 09:26   DG Abd 1 View  Result Date: 07/09/2022 CLINICAL  DATA:  Constipation.  Abdominal pain, nausea and vomiting. EXAM: ABDOMEN - 1 VIEW COMPARISON:  CT AP 07/29/2020 FINDINGS: Diffuse gaseous  distension of the gastric lumen is identified. No dilated small bowel loops identified. Moderate stool burden is noted throughout the colon up to the level of the rectum. IMPRESSION: 1. Diffuse gaseous distension of the gastric lumen. 2. Moderate stool burden throughout the colon. Electronically Signed   By: Kerby Moors M.D.   On: 07/09/2022 17:56    Procedures .Critical Care  Performed by: Hayden Rasmussen, MD Authorized by: Hayden Rasmussen, MD   Critical care provider statement:    Critical care time (minutes):  45   Critical care time was exclusive of:  Separately billable procedures and treating other patients   Critical care was necessary to treat or prevent imminent or life-threatening deterioration of the following conditions:  Dehydration, metabolic crisis, renal failure and endocrine crisis   Critical care was time spent personally by me on the following activities:  Development of treatment plan with patient or surrogate, discussions with consultants, evaluation of patient's response to treatment, examination of patient, obtaining history from patient or surrogate, ordering and performing treatments and interventions, ordering and review of laboratory studies, ordering and review of radiographic studies, pulse oximetry and re-evaluation of patient's condition   I assumed direction of critical care for this patient from another provider in my specialty: no       Medications Ordered in ED Medications  insulin regular, human (MYXREDLIN) 100 units/ 100 mL infusion (has no administration in time range)  lactated ringers infusion (has no administration in time range)  dextrose 5 % in lactated ringers infusion (has no administration in time range)  dextrose 50 % solution 0-50 mL (has no administration in time range)  lactated ringers bolus 1,000 mL (has no administration in time range)    ED Course/ Medical Decision Making/ A&P Clinical Course as of 07/10/22 0937  Thu Jul 09, 2022   1600 I discussed with Jerene Pitch from critical care.  She said they will evaluate patient and get back to me with recommendations. [MB]  1620 Patient was seen by critical care team Dr Verlee Monte and they felt he was appropriate for hospitalist admission. [MB]  1629 Discussed with [facility] hospitalist Dr. Marylyn Ishihara.  He said he would evaluate patient and let us know if he would not be accepting. [MB]    Clinical Course User Index [MB] Hayden Rasmussen, MD                           Medical Decision Making Risk Prescription drug management. Decision regarding hospitalization.   This patient complains of weakness dehydration and thirst; this involves an extensive number of treatment Options and is a complaint that carries with it a high risk of complications and morbidity. The differential includes DKA, hyperglycemia, metabolic derangement, renal failure  I ordered, reviewed and interpreted labs, which included CBC with elevated white count, chemistries markedly abnormal with low sodium elevated glucose low bicarb elevated creatinine, VBG with significant acidosis, beta hydroxybutyrate elevated, urinalysis possible signs of infection significant ketones I ordered medication IV fluids IV insulin and reviewed PMP when indicated. I ordered imaging studies which included chest x-ray and I independently    visualized and interpreted imaging which showed no acute findings Previous records obtained and reviewed in epic no recent admissions I consulted critical care Dr. Verlee Monte and [facility] hospitalist Dr. Marylyn Ishihara and discussed lab and  imaging findings and discussed disposition.  Cardiac monitoring reviewed, sinus tachycardia Social determinants considered, no significant barriers Critical Interventions: Initiation of fluids and IV insulin for DKA  After the interventions stated above, I reevaluated the patient and found patient have a clear sensorium and nontoxic-appearing Admission and further testing considered, he  would benefit from mission to the hospital for further management.  Patient in agreement with plan for admission.         Final Clinical Impression(s) / ED Diagnoses Final diagnoses:  Diabetic ketoacidosis without coma associated with diabetes mellitus due to underlying condition ([place])  Dehydration    Rx / DC Orders ED Discharge Orders     None         Hayden Rasmussen, MD 07/10/22 [phone]

## 2022-07-09 NOTE — ED Provider Triage Note (Signed)
Emergency Medicine Provider Triage Evaluation Note  Nicholas Knight , a 51 y.o. male  was evaluated in triage.  Pt complains of presenting with abdominal pain, nausea and vomiting for couple of days.  Reports being type II diabetic and has been out of his medications for "a while."  He says it is likely been multiple months.  Review of Systems  Positive:  Negative:   Physical Exam  BP 130/80 (BP Location: Right Arm)   Pulse 68   Temp 98 F (36.7 C) (Oral)   Resp 16   SpO2 99%  Gen:   Awake, no distress   Resp:  Normal effort  MSK:   Moves extremities without difficulty  Other:  Generalized abd tenderness, pale and uncomfortable in appearance  Medical Decision Making  Medically screening exam initiated at 1:36 PM.  Appropriate orders placed.  Domnick Chervenak was informed that the remainder of the evaluation will be completed by another provider, this initial triage assessment does not replace that evaluation, and the importance of remaining in the ED until their evaluation is complete.  Blood glucose reading "high"   [place]uana Wank A, PA-C 07/09/22 1337

## 2022-07-09 NOTE — ED Triage Notes (Signed)
BIB [facility] for n/v and out of insulin "for months".  Cbg-315 with ems 4mg  Zofran with ems

## 2022-07-09 NOTE — H&P (Signed)
History and Physical    Patient: Nicholas Knight ONG:[id number] DOB: June 03, 1971 DOA: 07/09/2022 DOS: the patient was seen and examined on 07/09/2022 PCP: [name], DO  Patient coming from: Home  Chief Complaint: Polydipsia, fatigue  HPI: Nicholas Knight is a 51 y.o. male with medical history significant of DM2, HTN, chronic HFrEF. Presenting with polydipsia and fatigue. He reports over the last 3 - 4 days he had felt generally fatigued. He has had blurred vision. He had had unquenchable thirst and has been voiding excessively. He has had upset stomach and has been taking a lot of pepto bismol for it. He denies any abdominal pain, chest pain, dyspnea, fevers, sick contacts. When his symptoms did not improve today, he decided to come to the ED for evaluation. Of note, he has been out of his levemir for a year. He says this is due to him not having insurance. He has been taking metformin, but has also run out of that. He has recently acquired insurance through the [place] system and will start with his new PCP in January. He denies any other aggravating or alleviating factors.     Review of Systems: As mentioned in the history of present illness. All other systems reviewed and are negative. Past Medical History:  Diagnosis Date   CHF (congestive heart failure) ([place])    Diabetes mellitus type 2 in obese ([place]) 09/12/2015   Diabetic ketoacidosis ([place]) 08/2019   Exertional dyspnea    Hypertension    Nonischemic dilated cardiomyopathy [facility])    Past Surgical History:  Procedure Laterality Date   RIGHT/LEFT HEART CATH AND CORONARY ANGIOGRAPHY N/A 11/28/2018   Procedure: RIGHT/LEFT HEART CATH AND CORONARY ANGIOGRAPHY;  Surgeon: Lorretta Harp, MD;  Location: [facility];  Service: Cardiovascular;  Laterality: N/A;   Social History:  reports that he has never smoked. He has never used smokeless tobacco. He reports that he does not drink alcohol and does not use drugs.  Allergies  Allergen  Reactions   Penicillins Other (See Comments)    Did it involve swelling of the face/tongue/throat, SOB, or low BP? N/A Did it involve sudden or severe rash/hives, skin peeling, or any reaction on the inside of your mouth or nose?N/A Did you need to seek medical attention at a hospital or doctor's office? N/A When did it last happen? Childhood    If all above answers are "NO", may proceed with cephalosporin use.    Family History  Problem Relation Age of Onset   Cancer Mother    Lupus Maternal Grandmother     Prior to Admission medications   Medication Sig Start Date End Date Taking? Authorizing Provider  atorvastatin (LIPITOR) 80 MG tablet TAKE 1 TABLET (80 MG TOTAL) BY MOUTH DAILY AT 6 PM. 03/03/21   Jerline Pain, MD  carvedilol (COREG) 25 MG tablet Take 1 tablet (25 mg total) by mouth 2 (two) times daily with a meal. LAST REFILL NEEDS APPOINTMENT 08/27/21   Bensimhon, Shaune Pascal, MD  furosemide (LASIX) 20 MG tablet TAKE 1 TABLET BY MOUTH EVERY DAY 08/06/21   Lurline Del, DO  Insulin Pen Needle (B-D UF III MINI PEN NEEDLES) 31G X 5 MM MISC USE TO INJECT INSULIN AT BED TIME 10/19/19   Meccariello, Bernita Raisin, MD  JARDIANCE 10 MG TABS tablet TAKE 1 TABLET BY MOUTH EVERY DAY 11/26/20   Meccariello, Bernita Raisin, MD  LEVEMIR FLEXTOUCH 100 UNIT/ML FlexTouch Pen INJECT 30 UNITS INTO SKIN AT BEDTIME 09/11/21   Welborn,  Ryan, DO  metFORMIN (GLUCOPHAGE) 500 MG tablet TAKE 2 TABLETS BY MOUTH EVERY DAY WITH BREAKFAST 05/04/21   Welborn, Ryan, DO  sacubitril-valsartan (ENTRESTO) 97-103 MG Take 1 tablet by mouth in the morning and at bedtime. Please call for office visit for additional refills . [phone] 01/29/21   Bensimhon, Shaune Pascal, MD  spironolactone (ALDACTONE) 25 MG tablet TAKE 1 TABLET (25 MG TOTAL) BY MOUTH DAILY. 06/17/21   Bensimhon, Shaune Pascal, MD    Physical Exam: Vitals:   07/09/22 1324 07/09/22 1500  BP: 130/80 (!) 118/96  Pulse: 68 (!) 112  Resp: 16 (!) 25  Temp: 98 F (36.7 C)   TempSrc:  Oral   SpO2: 99% 100%   General: 51 y.o. male resting in bed in NAD Eyes: PERRL, normal sclera ENMT: Nares patent w/o discharge, orophaynx clear, dentition normal, ears w/o discharge/lesions/ulcers; mucous membranes are dry Neck: Supple, trachea midline Cardiovascular: tachy, +S1, S2, no m/g/r, equal pulses throughout Respiratory: tachypnic, no w/r/r, slightly increased WOB on RA GI: BS hypoactive, mild distention, soft, NT, no masses noted, no organomegaly noted MSK: No e/c/c Neuro: A&O x 3, no focal deficits Psyc: Appropriate interaction and affect, calm/cooperative  Data Reviewed:  Results for orders placed or performed during the hospital encounter of 07/09/22 (from the past 24 hour(s))  CBG monitoring, ED     Status: Abnormal   Collection Time: 07/09/22  1:36 PM  Result Value Ref Range   Glucose-Capillary >600 (HH) 70 - 99 mg/dL  CBC     Status: Abnormal   Collection Time: 07/09/22  1:50 PM  Result Value Ref Range   WBC 13.6 (H) 4.0 - 10.5 K/uL   RBC 7.03 (H) 4.22 - 5.81 MIL/uL   Hemoglobin 18.7 (H) 13.0 - 17.0 g/dL   HCT 59.8 (H) 39.0 - 52.0 %   MCV 85.1 80.0 - 100.0 fL   MCH 26.6 26.0 - 34.0 pg   MCHC 31.3 30.0 - 36.0 g/dL   RDW 14.7 11.5 - 15.5 %   Platelets 426 (H) 150 - 400 K/uL   nRBC 0.0 0.0 - 0.2 %  Comprehensive metabolic panel     Status: Abnormal   Collection Time: 07/09/22  1:50 PM  Result Value Ref Range   Sodium 131 (L) 135 - 145 mmol/L   Potassium 4.5 3.5 - 5.1 mmol/L   Chloride 93 (L) 98 - 111 mmol/L   CO2 9 (L) 22 - 32 mmol/L   Glucose, Bld 597 (HH) 70 - 99 mg/dL   BUN 39 (H) 6 - 20 mg/dL   Creatinine, Ser 2.39 (H) 0.61 - 1.24 mg/dL   Calcium 8.9 8.9 - 10.3 mg/dL   Total Protein 7.8 6.5 - 8.1 g/dL   Albumin 3.8 3.5 - 5.0 g/dL   AST 20 15 - 41 U/L   ALT 28 0 - 44 U/L   Alkaline Phosphatase 93 38 - 126 U/L   Total Bilirubin 1.4 (H) 0.3 - 1.2 mg/dL   GFR, Estimated 32 (L) >60 mL/min   Anion gap 29 (H) 5 - 15  Lipase, blood     Status: None    Collection Time: 07/09/22  1:50 PM  Result Value Ref Range   Lipase 50 11 - 51 U/L  Beta-hydroxybutyric acid     Status: Abnormal   Collection Time: 07/09/22  1:50 PM  Result Value Ref Range   Beta-Hydroxybutyric Acid >8.00 (H) 0.05 - 0.27 mmol/L  Blood gas, venous (at [facility] and [facility], not at [facility])  Status: Abnormal   Collection Time: 07/09/22  1:52 PM  Result Value Ref Range   pH, Ven <6.95 (LL) 7.25 - 7.43   pCO2, Ven 45 44 - 60 mmHg   pO2, Ven <31 (LL) 32 - 45 mmHg   Bicarbonate 8.0 (L) 20.0 - 28.0 mmol/L   Acid-base deficit 26.0 (H) 0.0 - 2.0 mmol/L   O2 Saturation 31.5 %   Patient temperature 37.0   POC CBG, ED     Status: Abnormal   Collection Time: 07/09/22  3:17 PM  Result Value Ref Range   Glucose-Capillary >600 (HH) 70 - 99 mg/dL  Urinalysis, Routine w reflex microscopic Urine, Clean Catch     Status: Abnormal   Collection Time: 07/09/22  3:44 PM  Result Value Ref Range   Color, Urine YELLOW YELLOW   APPearance HAZY (A) CLEAR   Specific Gravity, Urine 1.023 1.005 - 1.030   pH 5.0 5.0 - 8.0   Glucose, UA >=500 (A) NEGATIVE mg/dL   Hgb urine dipstick SMALL (A) NEGATIVE   Bilirubin Urine NEGATIVE NEGATIVE   Ketones, ur 80 (A) NEGATIVE mg/dL   Protein, ur 100 (A) NEGATIVE mg/dL   Nitrite NEGATIVE NEGATIVE   Leukocytes,Ua MODERATE (A) NEGATIVE   RBC / HPF 21-50 0 - 5 RBC/hpf   WBC, UA >50 (H) 0 - 5 WBC/hpf   Bacteria, UA RARE (A) NONE SEEN   Squamous Epithelial / LPF 0-5 0 - 5   Mucus PRESENT    Hyaline Casts, UA PRESENT   CBG monitoring, ED     Status: Abnormal   Collection Time: 07/09/22  3:57 PM  Result Value Ref Range   Glucose-Capillary >600 (HH) 70 - 99 mg/dL   Assessment and Plan: DKA DM2 uncontrolled     - admit to inpt, SDU     - continue endotool: insulin gtt, fluids     - follow BMP, Beta-hydroxicbutyric acid     - check A1c     - DM coordinator     - may have non-caloric fluids     - anti-emetics  HTN Chronic systolic HF     - continue home  regimen when confirmed  AKI     - renal US     - watch nephrotoxins     - fluids  Pseudohyponatremia     - Na+ corrects to 143; follow  Advance Care Planning:   Code Status: FULL  Consults: None  Family Communication: w/ wife at bedside  Severity of Illness: The appropriate patient status for this patient is INPATIENT. Inpatient status is judged to be reasonable and necessary in order to provide the required intensity of service to ensure the patient's safety. The patient's presenting symptoms, physical exam findings, and initial radiographic and laboratory data in the context of their chronic comorbidities is felt to place them at high risk for further clinical deterioration. Furthermore, it is not anticipated that the patient will be medically stable for discharge from the hospital within 2 midnights of admission.   * I certify that at the point of admission it is my clinical judgment that the patient will require inpatient hospital care spanning beyond 2 midnights from the point of admission due to high intensity of service, high risk for further deterioration and high frequency of surveillance required.*  Author: Jonnie Finner, DO 07/09/2022 5:32 PM  For on call review [email or web address].

## 2022-07-10 ENCOUNTER — Other Ambulatory Visit (HOSPITAL_COMMUNITY): Payer: Self-pay

## 2022-07-10 ENCOUNTER — Inpatient Hospital Stay (HOSPITAL_COMMUNITY): Payer: No Typology Code available for payment source

## 2022-07-10 ENCOUNTER — Encounter (HOSPITAL_COMMUNITY): Payer: Self-pay | Admitting: Internal Medicine

## 2022-07-10 ENCOUNTER — Other Ambulatory Visit: Payer: Self-pay

## 2022-07-10 DIAGNOSIS — I5020 Unspecified systolic (congestive) heart failure: Secondary | ICD-10-CM

## 2022-07-10 DIAGNOSIS — I420 Dilated cardiomyopathy: Secondary | ICD-10-CM

## 2022-07-10 DIAGNOSIS — I428 Other cardiomyopathies: Secondary | ICD-10-CM

## 2022-07-10 DIAGNOSIS — I959 Hypotension, unspecified: Secondary | ICD-10-CM | POA: Insufficient documentation

## 2022-07-10 DIAGNOSIS — E860 Dehydration: Secondary | ICD-10-CM

## 2022-07-10 DIAGNOSIS — N179 Acute kidney failure, unspecified: Secondary | ICD-10-CM | POA: Diagnosis not present

## 2022-07-10 DIAGNOSIS — E782 Mixed hyperlipidemia: Secondary | ICD-10-CM

## 2022-07-10 DIAGNOSIS — E1110 Type 2 diabetes mellitus with ketoacidosis without coma: Secondary | ICD-10-CM | POA: Diagnosis not present

## 2022-07-10 DIAGNOSIS — R7989 Other specified abnormal findings of blood chemistry: Secondary | ICD-10-CM

## 2022-07-10 DIAGNOSIS — I10 Essential (primary) hypertension: Secondary | ICD-10-CM

## 2022-07-10 LAB — BASIC METABOLIC PANEL
Anion gap: 10 (ref 5–15)
Anion gap: 9 (ref 5–15)
Anion gap: 10 (ref 5–15)
Anion gap: 8 (ref 5–15)
Anion gap: 10 (ref 5–15)
BUN: 28 mg/dL — ABNORMAL HIGH (ref 6–20)
BUN: 25 mg/dL — ABNORMAL HIGH (ref 6–20)
BUN: 24 mg/dL — ABNORMAL HIGH (ref 6–20)
BUN: 22 mg/dL — ABNORMAL HIGH (ref 6–20)
BUN: 18 mg/dL (ref 6–20)
CO2: 16 mmol/L — ABNORMAL LOW (ref 22–32)
CO2: 14 mmol/L — ABNORMAL LOW (ref 22–32)
CO2: 17 mmol/L — ABNORMAL LOW (ref 22–32)
CO2: 19 mmol/L — ABNORMAL LOW (ref 22–32)
CO2: 19 mmol/L — ABNORMAL LOW (ref 22–32)
Calcium: 8.4 mg/dL — ABNORMAL LOW (ref 8.9–10.3)
Calcium: 8.2 mg/dL — ABNORMAL LOW (ref 8.9–10.3)
Calcium: 8.4 mg/dL — ABNORMAL LOW (ref 8.9–10.3)
Calcium: 8.4 mg/dL — ABNORMAL LOW (ref 8.9–10.3)
Calcium: 8.5 mg/dL — ABNORMAL LOW (ref 8.9–10.3)
Chloride: 110 mmol/L (ref 98–111)
Chloride: 110 mmol/L (ref 98–111)
Chloride: 113 mmol/L — ABNORMAL HIGH (ref 98–111)
Chloride: 112 mmol/L — ABNORMAL HIGH (ref 98–111)
Chloride: 112 mmol/L — ABNORMAL HIGH (ref 98–111)
Creatinine, Ser: 1.43 mg/dL — ABNORMAL HIGH (ref 0.61–1.24)
Creatinine, Ser: 1.3 mg/dL — ABNORMAL HIGH (ref 0.61–1.24)
Creatinine, Ser: 1.12 mg/dL (ref 0.61–1.24)
Creatinine, Ser: 1.15 mg/dL (ref 0.61–1.24)
Creatinine, Ser: 1.16 mg/dL (ref 0.61–1.24)
GFR, Estimated: 59 mL/min — ABNORMAL LOW (ref >60)
GFR, Estimated: >60 mL/min (ref >60)
GFR, Estimated: >60 mL/min (ref >60)
GFR, Estimated: >60 mL/min (ref >60)
GFR, Estimated: >60 mL/min (ref >60)
Glucose, Bld: 232 mg/dL — ABNORMAL HIGH (ref 70–99)
Glucose, Bld: 204 mg/dL — ABNORMAL HIGH (ref 70–99)
Glucose, Bld: 194 mg/dL — ABNORMAL HIGH (ref 70–99)
Glucose, Bld: 162 mg/dL — ABNORMAL HIGH (ref 70–99)
Glucose, Bld: 168 mg/dL — ABNORMAL HIGH (ref 70–99)
Potassium: 4.2 mmol/L (ref 3.5–5.1)
Potassium: 5.1 mmol/L (ref 3.5–5.1)
Potassium: 3.6 mmol/L (ref 3.5–5.1)
Potassium: 3.4 mmol/L — ABNORMAL LOW (ref 3.5–5.1)
Potassium: 3.1 mmol/L — ABNORMAL LOW (ref 3.5–5.1)
Sodium: 136 mmol/L (ref 135–145)
Sodium: 133 mmol/L — ABNORMAL LOW (ref 135–145)
Sodium: 140 mmol/L (ref 135–145)
Sodium: 139 mmol/L (ref 135–145)
Sodium: 141 mmol/L (ref 135–145)

## 2022-07-10 LAB — BETA-HYDROXYBUTYRIC ACID
Beta-Hydroxybutyric Acid: 3.5 mmol/L — ABNORMAL HIGH (ref 0.05–0.27)
Beta-Hydroxybutyric Acid: 0.55 mmol/L — ABNORMAL HIGH (ref 0.05–0.27)
Beta-Hydroxybutyric Acid: 0.92 mmol/L — ABNORMAL HIGH (ref 0.05–0.27)

## 2022-07-10 LAB — GLUCOSE, CAPILLARY
Glucose-Capillary: 301 mg/dL — ABNORMAL HIGH (ref 70–99)
Glucose-Capillary: 230 mg/dL — ABNORMAL HIGH (ref 70–99)
Glucose-Capillary: 206 mg/dL — ABNORMAL HIGH (ref 70–99)
Glucose-Capillary: 179 mg/dL — ABNORMAL HIGH (ref 70–99)
Glucose-Capillary: 185 mg/dL — ABNORMAL HIGH (ref 70–99)
Glucose-Capillary: 217 mg/dL — ABNORMAL HIGH (ref 70–99)
Glucose-Capillary: 191 mg/dL — ABNORMAL HIGH (ref 70–99)
Glucose-Capillary: 194 mg/dL — ABNORMAL HIGH (ref 70–99)
Glucose-Capillary: 203 mg/dL — ABNORMAL HIGH (ref 70–99)
Glucose-Capillary: 150 mg/dL — ABNORMAL HIGH (ref 70–99)
Glucose-Capillary: 123 mg/dL — ABNORMAL HIGH (ref 70–99)
Glucose-Capillary: 165 mg/dL — ABNORMAL HIGH (ref 70–99)
Glucose-Capillary: 175 mg/dL — ABNORMAL HIGH (ref 70–99)
Glucose-Capillary: 184 mg/dL — ABNORMAL HIGH (ref 70–99)
Glucose-Capillary: 177 mg/dL — ABNORMAL HIGH (ref 70–99)
Glucose-Capillary: 171 mg/dL — ABNORMAL HIGH (ref 70–99)
Glucose-Capillary: 167 mg/dL — ABNORMAL HIGH (ref 70–99)
Glucose-Capillary: 187 mg/dL — ABNORMAL HIGH (ref 70–99)
Glucose-Capillary: 236 mg/dL — ABNORMAL HIGH (ref 70–99)

## 2022-07-10 LAB — CBC
HCT: 45.5 % (ref 39.0–52.0)
Hemoglobin: 15 g/dL (ref 13.0–17.0)
MCH: 26.6 pg (ref 26.0–34.0)
MCHC: 33 g/dL (ref 30.0–36.0)
MCV: 80.8 fL (ref 80.0–100.0)
Platelets: 278 10*3/uL (ref 150–400)
RBC: 5.63 MIL/uL (ref 4.22–5.81)
RDW: 13.2 % (ref 11.5–15.5)
WBC: 13.9 10*3/uL — ABNORMAL HIGH (ref 4.0–10.5)
nRBC: 0 % (ref 0.0–0.2)

## 2022-07-10 LAB — ECHOCARDIOGRAM COMPLETE
Area-P 1/2: 2.94 cm2
Height: 69 in
S' Lateral: 3.1 cm
Weight: 2895.96 oz

## 2022-07-10 LAB — TROPONIN I (HIGH SENSITIVITY)
Troponin I (High Sensitivity): 16 ng/L (ref <18)
Troponin I (High Sensitivity): 15 ng/L (ref <18)

## 2022-07-10 LAB — HIV ANTIBODY (ROUTINE TESTING W REFLEX): HIV Screen 4th Generation wRfx: NONREACTIVE

## 2022-07-10 LAB — MRSA NEXT GEN BY PCR, NASAL: MRSA by PCR Next Gen: NOT DETECTED

## 2022-07-10 MED ORDER — ALBUMIN HUMAN 25 % IV SOLN
25.0000 g | Freq: Four times a day (QID) | INTRAVENOUS | Status: AC
  Administered 2022-07-10 – 2022-07-11 (×3): 25 g via INTRAVENOUS
  Filled 2022-07-10 (×3): qty 100

## 2022-07-10 MED ORDER — LACTATED RINGERS IV BOLUS: 250.0000 mL | Freq: Once | INTRAVENOUS | Status: DC

## 2022-07-10 MED ORDER — ORAL CARE MOUTH RINSE: 15.0000 mL | OROMUCOSAL | Status: DC | PRN

## 2022-07-10 MED ORDER — INSULIN GLARGINE-YFGN 100 UNIT/ML ~~LOC~~ SOLN
18.0000 [IU] | Freq: Every day | SUBCUTANEOUS | Status: DC
  Administered 2022-07-10: 18 [IU] via SUBCUTANEOUS
  Filled 2022-07-10 (×2): qty 0.18

## 2022-07-10 MED ORDER — ENOXAPARIN SODIUM 40 MG/0.4ML IJ SOSY
40.0000 mg | PREFILLED_SYRINGE | INTRAMUSCULAR | Status: DC
  Administered 2022-07-10: 40 mg via SUBCUTANEOUS
  Filled 2022-07-10 (×2): qty 0.4

## 2022-07-10 MED ORDER — INSULIN ASPART 100 UNIT/ML IJ SOLN
0.0000 [IU] | Freq: Three times a day (TID) | INTRAMUSCULAR | Status: DC
  Administered 2022-07-11 (×2): 5 [IU] via SUBCUTANEOUS
  Administered 2022-07-11: 3 [IU] via SUBCUTANEOUS
  Administered 2022-07-12: 7 [IU] via SUBCUTANEOUS
  Administered 2022-07-12: 2 [IU] via SUBCUTANEOUS
  Administered 2022-07-12: 5 [IU] via SUBCUTANEOUS
  Administered 2022-07-13: 2 [IU] via SUBCUTANEOUS
  Administered 2022-07-13: 3 [IU] via SUBCUTANEOUS

## 2022-07-10 MED ORDER — FLEET ENEMA 7-19 GM/118ML RE ENEM
1.0000 | ENEMA | Freq: Once | RECTAL | Status: DC
  Filled 2022-07-10: qty 1

## 2022-07-10 MED ORDER — SODIUM CHLORIDE 0.9 % IV SOLN
2.0000 g | INTRAVENOUS | Status: DC
  Administered 2022-07-10 – 2022-07-12 (×3): 2 g via INTRAVENOUS
  Filled 2022-07-10 (×3): qty 20

## 2022-07-10 MED ORDER — SODIUM BICARBONATE 650 MG PO TABS
650.0000 mg | ORAL_TABLET | Freq: Two times a day (BID) | ORAL | Status: DC
  Administered 2022-07-10 – 2022-07-11 (×3): 650 mg via ORAL
  Filled 2022-07-10 (×3): qty 1

## 2022-07-10 MED ORDER — POTASSIUM CHLORIDE CRYS ER 10 MEQ PO TBCR
40.0000 meq | EXTENDED_RELEASE_TABLET | ORAL | Status: AC
  Administered 2022-07-10 (×2): 40 meq via ORAL
  Filled 2022-07-10 (×2): qty 4

## 2022-07-10 MED ORDER — SODIUM CHLORIDE 0.9 % IV BOLUS: 1000.0000 mL | Freq: Once | INTRAVENOUS | Status: DC

## 2022-07-10 MED ORDER — SODIUM CHLORIDE 0.9 % IV BOLUS
500.0000 mL | Freq: Once | INTRAVENOUS | Status: AC
  Administered 2022-07-10: 500 mL via INTRAVENOUS

## 2022-07-10 MED ORDER — ATORVASTATIN CALCIUM 40 MG PO TABS
80.0000 mg | ORAL_TABLET | Freq: Every day | ORAL | Status: DC
  Administered 2022-07-10 – 2022-07-12 (×3): 80 mg via ORAL
  Filled 2022-07-10 (×3): qty 2

## 2022-07-10 MED ORDER — SORBITOL 70 % SOLN
30.0000 mL | Status: AC
  Administered 2022-07-10 (×2): 30 mL via ORAL
  Filled 2022-07-10 (×2): qty 30

## 2022-07-10 MED ORDER — LACTATED RINGERS IV BOLUS: 500.0000 mL | Freq: Once | INTRAVENOUS | Status: DC

## 2022-07-10 MED ORDER — POTASSIUM CHLORIDE 10 MEQ/100ML IV SOLN
10.0000 meq | INTRAVENOUS | Status: AC
  Administered 2022-07-10 (×2): 10 meq via INTRAVENOUS

## 2022-07-10 MED ORDER — INSULIN ASPART 100 UNIT/ML IJ SOLN
0.0000 [IU] | Freq: Every day | INTRAMUSCULAR | Status: DC
  Administered 2022-07-10: 2 [IU] via SUBCUTANEOUS

## 2022-07-10 MED ORDER — CHLORHEXIDINE GLUCONATE CLOTH 2 % EX PADS
6.0000 | MEDICATED_PAD | Freq: Every day | CUTANEOUS | Status: DC
  Administered 2022-07-10 – 2022-07-13 (×3): 6 via TOPICAL

## 2022-07-10 MED ORDER — SODIUM CHLORIDE 0.9 % IV SOLN: INTRAVENOUS | Status: DC

## 2022-07-10 NOTE — Progress Notes (Signed)
PROGRESS NOTE    Nicholas Knight  HWE:[id number] DOB: 1970-09-10 DOA: 07/09/2022 PCP: [name], DO    Chief Complaint  Patient presents with   Emesis    Brief Narrative:  Patient is a pleasant 51 year old gentleman history of type 2 diabetes poorly controlled, hypertension, nonischemic cardiomyopathy, chronic systolic heart failure, presenting to the ED with polydipsia, generalized fatigue for the past 3 to 4 days with some blurry vision.  Patient noted to have unquenchable thirst, polyuria.  Patient also noted to have an upset stomach taking lots of Pepto-Bismol.  Patient presented to the ED with no chest pain no dyspnea, no fevers no sick contacts.  Did endorse some dysuria.  As patient had no specific clinical improvement presented to the ED.  Patient noted to have not had insurance and as such had been out of his Levemir for over a year.  Patient noted to have been taking his metformin but had recently run out of that.  Patient seen in the ED workup concerning for DKA.  Patient admitted and placed on the Endo tool.    Assessment & Plan:  Principal Problem:   DKA (diabetic ketoacidosis) ([place]) Active Problems:   Hypertension   AKI (acute kidney injury) ([place])   DM2 (diabetes mellitus, type 2) ([place])   Nonischemic dilated cardiomyopathy ([facility])   Hyperlipidemia   Heart failure with reduced ejection fraction ([place])   Diabetes mellitus without complication ([place])   Pseudohyponatremia   Hypotension    Assessment and Plan: * DKA (diabetic ketoacidosis) ([place]) -Patient presented in DKA with polyuria, polydipsia, blurry vision, generalized fatigue, also noted to have some dysuria. -Urinalysis done on admission with glycosuria, moderate leukocytes, 100 protein, rare bacteria, WBC > 50, 80 ketones noted. -Comprehensive metabolic profile done with a sodium of 131, bicarb of 9, chloride of 93, glucose of 597, creatinine of 2.39 with a BUN of 39, anion gap of 29. -CBC with a  leukocytosis. -Beta hydroxybutyrate was > 8. -Patient placed on the Endo tool with serial Bumex, bowel rest. -DKA slowly improving however patient still acidotic with a bicarb of 14 this morning, anion gap of 9, glucose of 204. -Continue Endo tool until acidosis is resolved prior to transitioning to subcutaneous insulin and SSI. -Urinalysis concerning for UTI, check urine cultures and place empirically on IV Rocephin. -EKG with no significant ischemic changes noted however patient presented in DKA, history of nonischemic cardiomyopathy, systolic blood pressures in the 80s and as such we will cycle cardiac enzymes. -Diabetes coordinator consulted and following. -Consult with TOC for help with medication postdischarge.  Hypotension ??  Etiology. -Patient noted overnight to have systolic blood sugars in the mid to high 80s currently with systolic in the mid 89F with a MAP of 55. -Patient with no overt bleeding, hemoglobin stable at 15.0. -Patient with a leukocytosis white count of 13.9, urinalysis concerning for UTI, check urine cultures. -Check blood cultures x 2. -Check a chest x-ray. -Cycle cardiac enzymes, check a 2D echo. -Due to nonischemic cardiomyopathy gentle hydration currently. -Normal saline 500 cc bolus x 1 and then placed on IV albumin every 6 hours x 24 hours. -Patient seems to be mentating well, following commands appropriately. -If no significant improvement with blood pressure despite IV albumin and still hypotensive may need to be started on pressors.  Pseudohyponatremia -Secondary to DKA/hyperglycemia. -Sodium at 133.  Hyperlipidemia -Patient noted to have had a fasting lipid panel 10/17/2020 with a triglycerides of 171, HDL of 30, total cholesterol of 109, LDL of  50. -Repeat fasting lipid panel. -Start Lipitor 40 mg daily.  Nonischemic dilated cardiomyopathy ([place]) -Patient noted with a history of nonischemic dilated cardiomyopathy. -2D echo from 05/03/2020 with EF of  30 to 35%, left ventricular global hypokinesis, mild concentric LVH, grade 1 diastolic dysfunction. -Patient noted to have had a cardiac catheterization done 11/28/2018 with clean coronaries noted, dilated left ventricle with EF of 15%. -Patient presenting in DKA, cycle cardiac enzymes, repeat 2D echo. -Noted on admission with acute renal failure, also hypotensive, hold Entresto, Coreg, Lasix, Aldactone. -If 2D echo is worsened or cardiac enzymes are elevated we will consult with cardiology for further evaluation and management. -Needs outpatient follow-up back with cardiologist once patient is stabilized and discharged.  DM2 (diabetes mellitus, type 2) ([place]) -See DKA above. -Check a hemoglobin A1c. -Patient noted to have been on long-acting insulin prior to admission in addition to metformin however due to insurance issues has been out of his long-acting insulin/Levemir for greater than a year. -Continue treatment as in DKA above. -Diabetes coordinator following. -Consult with TOC for help with medications.  AKI (acute kidney injury) ([place]) -Patient on presentation with acute renal failure with a creatinine of 2.39. -Likely secondary to prerenal azotemia as patient noted to be hypotensive with systolic blood pressures in the 80s also noted in DKA and also patient noted to be on Entresto prior to admission. -Hold Entresto.. -Renal function improving with hydration. -Follow.  Hypertension -Patient with prior history of hypertension however patient with with hypotension overnight with systolic blood pressures in the mid 80s. -Urinalysis concerning for possible UTI, check urine cultures, placed empirically on IV Rocephin. -Patient with no bleeding.  Cycle cardiac enzymes. -Patient however alert, mentating well, noted to have some lightheadedness. -Patient with known ischemic cardiomyopathy, last EF on 2D echo was 30 to 93%, grade 1 diastolic dysfunction and as such need to monitor closely for  volume overload and hesitant to give significant fluid boluses. -Normal saline 500 cc bolus x 1 ordered. -Place on IV albumin every 6 hours x 24 hours and monitor volume status closely. -If blood pressure worsens with no improvement with IV albumin will consult with PCCM may need to be placed on pressors.         DVT prophylaxis: Lovenox Code Status: Full Family Communication: Updated patient.  No family at bedside. Disposition: Likely home once clinically improved.  Status is: Inpatient Remains inpatient appropriate because: Severity of illness   Consultants:  None  Procedures:  Chest x-ray 07/10/2022 Echo pending  Antimicrobials:  IV Rocephin 07/10/2022 >>>>   Subjective: Patient laying in bed.  Denies any chest pain.  No shortness of breath.  No abdominal pain.  Does endorse some dysuria.  A little bit of lightheadedness when sitting up.  Systolic blood pressures noted in the high 80s to low 90s.  Objective: Vitals:   07/10/22 1200 07/10/22 1300 07/10/22 1400 07/10/22 1500  BP: (!) 96/59 111/70 (!) 97/59 108/68  Pulse: (!) 108 96 91 96  Resp: 18 (!) 21 17 16   Temp: 98 F (36.7 C)     TempSrc: Oral     SpO2: 98% 93% 99% 96%  Weight:      Height:        Intake/Output Summary (Last 24 hours) at 07/10/2022 1521 Last data filed at 07/10/2022 1400 Gross per 24 hour  Intake 4364.06 ml  Output 300 ml  Net 4064.06 ml   Filed Weights   07/10/22 0300  Weight: 82.1 kg  Examination:  General exam: NAD. Respiratory system: Lungs clear to auscultation bilaterally.  No wheezes, no crackles, no rhonchi.  Fair air movement.  Speaking in full sentences.   Cardiovascular system: Regular rate rhythm no murmurs rubs or gallops.  No JVD.  No lower extremity edema. Gastrointestinal system: Abdomen is soft, nontender, nondistended, positive bowel sounds.  No rebound.  No guarding.  Central nervous system: Alert and oriented. No focal neurological deficits. Extremities:  Symmetric 5 x 5 power. Skin: No rashes, lesions or ulcers Psychiatry: Judgement and insight appear normal. Mood & affect appropriate.     Data Reviewed:   CBC: Recent Labs  Lab 07/09/22 1350 07/10/22 0248  WBC 13.6* 13.9*  HGB 18.7* 15.0  HCT 59.8* 45.5  MCV 85.1 80.8  PLT 426* 301    Basic Metabolic Panel: Recent Labs  Lab 07/09/22 1350 07/10/22 0248 07/10/22 0439 07/10/22 0840 07/10/22 1234  NA 131* 136 133* 140 139  K 4.5 4.2 5.1 3.6 3.4*  CL 93* 110 110 113* 112*  CO2 9* 16* 14* 17* 19*  GLUCOSE 597* 232* 204* 194* 162*  BUN 39* 28* 25* 24* 22*  CREATININE 2.39* 1.43* 1.30* 1.12 1.15  CALCIUM 8.9 8.4* 8.2* 8.4* 8.4*    GFR: Estimated Creatinine Clearance: 74.3 mL/min (by C-G formula based on SCr of 1.15 mg/dL).  Liver Function Tests: Recent Labs  Lab 07/09/22 1350  AST 20  ALT 28  ALKPHOS 93  BILITOT 1.4*  PROT 7.8  ALBUMIN 3.8    CBG: Recent Labs  Lab 07/10/22 1047 07/10/22 1147 07/10/22 1248 07/10/22 1350 07/10/22 1501  GLUCAP 150* 123* 165* 175* 184*     Recent Results (from the past 240 hour(s))  MRSA Next Gen by PCR, Nasal     Status: None   Collection Time: 07/10/22 12:49 AM   Specimen: Nasal Mucosa; Nasal Swab  Result Value Ref Range Status   MRSA by PCR Next Gen NOT DETECTED NOT DETECTED Final    Comment: (NOTE) The GeneXpert MRSA Assay (FDA approved for NASAL specimens only), is one component of a comprehensive MRSA colonization surveillance program. It is not intended to diagnose MRSA infection nor to guide or monitor treatment for MRSA infections. Test performance is not FDA approved in patients less than 73 years old. Performed at [facility], [place] [address], [place], [place] [address]          Radiology Studies: DG CHEST PORT 1 VIEW  Result Date: 07/10/2022 CLINICAL DATA:  Leukocytosis EXAM: PORTABLE CHEST 1 VIEW COMPARISON:  Chest x-ray dated November 26, 2018 FINDINGS: Heart size and  mediastinal contours are within normal limits for AP technique. Both lungs are clear. The visualized skeletal structures are unremarkable. IMPRESSION: No active disease. Electronically Signed   By: Yetta Glassman M.D.   On: 07/10/2022 09:26   DG Abd 1 View  Result Date: 07/09/2022 CLINICAL DATA:  Constipation.  Abdominal pain, nausea and vomiting. EXAM: ABDOMEN - 1 VIEW COMPARISON:  CT AP 07/29/2020 FINDINGS: Diffuse gaseous distension of the gastric lumen is identified. No dilated small bowel loops identified. Moderate stool burden is noted throughout the colon up to the level of the rectum. IMPRESSION: 1. Diffuse gaseous distension of the gastric lumen. 2. Moderate stool burden throughout the colon. Electronically Signed   By: Kerby Moors M.D.   On: 07/09/2022 17:56        Scheduled Meds:  atorvastatin  80 mg Oral q1800   Chlorhexidine Gluconate Cloth  6 each  Topical Daily   enoxaparin (LOVENOX) injection  40 mg Subcutaneous Q24H   Continuous Infusions:  albumin human Stopped (07/10/22 1345)   cefTRIAXone (ROCEPHIN)  IV Stopped (07/10/22 1436)   dextrose 5% lactated ringers 125 mL/hr at 07/10/22 1400   insulin 4.2 Units/hr (07/10/22 1400)   lactated ringers Stopped (07/10/22 0210)     LOS: 1 day    Time spent: 55 minutes    Irine Seal, MD [facility]   To contact the attending provider between 7A-7P or the covering provider during after hours 7P-7A, please log into the web site [email or web address] and access using universal [place] password for that web site. If you do not have the password, please call the hospital operator.  07/10/2022, 3:21 PM

## 2022-07-10 NOTE — Progress Notes (Addendum)
CBG @ 0100  is 301 CBG@  0212  is 230  CBG @ 0302   is 206 CBG were not crossing over into epic

## 2022-07-10 NOTE — Assessment & Plan Note (Addendum)
??    Etiology. -Patient noted the night of admission, 12 /7-12/ 8 to have systolic blood sugars in the mid to high 80s with a MAP of 55. -Patient with no overt bleeding, hemoglobin stable at 12.1 today. -Patient with a leukocytosis white count of 13.9 initially that resolved by day of discharge.   -Initial urinalysis concerning for UTI, patient also had symptoms of dysuria and placed empirically on IV Rocephin. -Urine cultures with multiple species present. -Blood cultures pending with no growth to date. -Chest x-ray no acute infiltrate. -Cardiac enzymes negative x 2. -2D echo with EF, 40 to 45%, left ventricular global wall hypokinesis -Status post fluid bolus, IV albumin with resolution of hypotension. -Patient completed 3-day course of IV Rocephin, hypotension improved and had resolved by day of discharge.

## 2022-07-10 NOTE — Assessment & Plan Note (Addendum)
-  Patient on presentation with acute renal failure with a creatinine of 2.39. -Likely secondary to prerenal azotemia as patient noted to be hypotensive with systolic blood pressures in the 80s also noted in DKA and also patient noted to be on Entresto prior to admission. -Renal function improved and normalized. -Patient placed back on Entresto at the lowest dose renal function remained stable.   -Outpatient follow-up.

## 2022-07-10 NOTE — Inpatient Diabetes Management (Signed)
Inpatient Diabetes Program Recommendations  AACE/ADA: New Consensus Statement on Inpatient Glycemic Control (2015)  Target Ranges:  Prepandial:   less than 140 mg/dL      Peak postprandial:   less than 180 mg/dL (1-2 hours)      Critically ill patients:  140 - 180 mg/dL   Lab Results  Component Value Date   GLUCAP 184 (H) 07/10/2022   HGBA1C 7.2 (A) 10/17/2020    Review of Glycemic Control  Diabetes history: DM2 Outpatient Diabetes medications: Levemir 30 QHS (not taking), metformin 1000 mg QAM, previously on Jardiance 10 mg QD Current orders for Inpatient glycemic control: IV insulin per EndoTool  HgbA1C pending  Inpatient Diabetes Program Recommendations:    Semglee 18 units QD (give 1-2 H prior to discontinuation of drip  Novolog 0-15 units TID with meals and 0-5 HS  Novolog 5 units TID for meal coverage insulin if eating > 50%  Spoke with pt at bedside regarding his diabetes control. Pt states he could not afford his insulin and was just taking the metformin. Discussed importance of taking insulin as prescribed and getting PCP or Endo to manage his diabetes. Pt states he will need affordable insulin at discharge. Talked about Novolin 70/30 insulin at [facility] ($44 for 5 pens). Pt states he would be able to afford this, if needed.  Awaiting HgbA1C results. Reviewed glucose and A1C goals. Reviewed signs and symptoms of hyperglycemia and hypoglycemia along with treatment for both. Discussed impact of nutrition, exercise, stress, sickness, and medications on diabetes control. Ordered Living Well with diabetes booklet and encouraged patient to read through entire book. Informed patient that he may be prescribed Novolin 70/30 since it is more affordable. Answered diet questions. Pt will need meter to check blood sugars at least 3x/day and take logbook to PCP for review. Answered all questions. Ready to transition off insulin drip. Spoke with MD regarding above.   Will continue to  follow.  Thank you. Lorenda Peck, RD, LDN, [place] Inpatient Diabetes Coordinator [phone]

## 2022-07-10 NOTE — Progress Notes (Signed)
Bp's have been low when in a deep sleep. Notified Raenette Rover, NP to review his bp trends. His bp is now 89/63 map 19. Provider came to bedside to assess him. He is awake and alert/oriented and denies any symptoms. Continuing to monitor. IVF fluid bolus Dc'd b/c of heart function/CHF.

## 2022-07-10 NOTE — Assessment & Plan Note (Signed)
-  Patient noted to have had a fasting lipid panel 10/17/2020 with a triglycerides of 171, HDL of 30, total cholesterol of 109, LDL of 50. -Repeat fasting lipid panel. -Start Lipitor 40 mg daily.

## 2022-07-10 NOTE — Assessment & Plan Note (Signed)
-  Patient presented in DKA with polyuria, polydipsia, blurry vision, generalized fatigue, also noted to have some dysuria. -Urinalysis done on admission with glycosuria, moderate leukocytes, 100 protein, rare bacteria, WBC > 50, 80 ketones noted. -Comprehensive metabolic profile done with a sodium of 131, bicarb of 9, chloride of 93, glucose of 597, creatinine of 2.39 with a BUN of 39, anion gap of 29. -CBC with a leukocytosis. -Beta hydroxybutyrate was > 8. -Patient placed on the Endo tool with serial Bumex, bowel rest. -DKA slowly improving however patient still acidotic with a bicarb of 14 this morning, anion gap of 9, glucose of 204. -Continue Endo tool until acidosis is resolved prior to transitioning to subcutaneous insulin and SSI. -Urinalysis concerning for UTI, check urine cultures and place empirically on IV Rocephin. -EKG with no significant ischemic changes noted however patient presented in DKA, history of nonischemic cardiomyopathy, systolic blood pressures in the 80s and as such we will cycle cardiac enzymes. -Diabetes coordinator consulted and following. -Consult with TOC for help with medication postdischarge.

## 2022-07-10 NOTE — Progress Notes (Signed)
Echocardiogram 2D Echocardiogram has been performed.  Oneal Deputy Lyriq Jarchow RDCS 07/10/2022, 1:46 PM

## 2022-07-10 NOTE — Assessment & Plan Note (Signed)
-  Patient noted with a history of nonischemic dilated cardiomyopathy. -2D echo from 05/03/2020 with EF of 30 to 35%, left ventricular global hypokinesis, mild concentric LVH, grade 1 diastolic dysfunction. -Patient noted to have had a cardiac catheterization done 11/28/2018 with clean coronaries noted, dilated left ventricle with EF of 15%. -Patient presenting in DKA, cycle cardiac enzymes, repeat 2D echo. -Noted on admission with acute renal failure, also hypotensive, hold Entresto, Coreg, Lasix, Aldactone. -If 2D echo is worsened or cardiac enzymes are elevated we will consult with cardiology for further evaluation and management. -Needs outpatient follow-up back with cardiologist once patient is stabilized and discharged.

## 2022-07-10 NOTE — TOC Initial Note (Addendum)
Transition of Care [facility]) - Initial/Assessment Note    Patient Details  Name: Nicholas Knight MRN: [id number] Date of Birth: 06-Apr-1971  [facility] CM/SW Contact:    Illene Regulus, LCSW Phone Number: 07/10/2022, 10:16 AM  Clinical Narrative:                  TOC CSW received consult for medication assistance, pt does not qualify for [facility] program as pt has insurance through the [place].    [place]  1:00pm CSW spoke with pt's [employer] social worker Nicholas Knight [phone]) who reported pt has not been seen since 2009. Nicholas Knight stated because pt has not been seen through the [place] his medications will not be covered under his VA benefits. She reported has an appointment with his [place] doctor January 18th at [place] to follow.      Patient Goals and CMS Choice        Expected Discharge Plan and Services                                                Prior Living Arrangements/Services                       Activities of Daily Living Home Assistive Devices/Equipment: None ADL Screening (condition at time of admission) Patient's cognitive ability adequate to safely complete daily activities?: Yes Is the patient deaf or have difficulty hearing?: No Does the patient have difficulty seeing, even when wearing glasses/contacts?: No Does the patient have difficulty concentrating, remembering, or making decisions?: No Patient able to express need for assistance with ADLs?: Yes Does the patient have difficulty dressing or bathing?: No Independently performs ADLs?: Yes (appropriate for developmental age) Does the patient have difficulty walking or climbing stairs?: No Weakness of Legs: None Weakness of Arms/Hands: None  Permission Sought/Granted                  Emotional Assessment              Admission diagnosis:  DKA (diabetic ketoacidosis) ([place]) [E11.10] Patient Active Problem List   Diagnosis Date Noted   DKA (diabetic ketoacidosis) ([place])  07/09/2022   Pseudohyponatremia 07/09/2022   Elevated PSA 05/16/2020   Diabetes mellitus without complication ([place]) 78/58/8502   Screen for colon cancer 01/08/2020   Heart failure with reduced ejection fraction ([place]) 08/25/2019   Hyperlipidemia 12/05/2018   Nonischemic dilated cardiomyopathy ([place])    Sciatica of right side 02/09/2018   DM2 (diabetes mellitus, type 2) ([place]) 09/12/2015   AKI (acute kidney injury) ([place]) 09/08/2015   Hypertension 07/07/2012   PCP:  [name], DO Pharmacy:   [facility], [place] [address] Phone: [phone] Fax: [phone]  [facility] [id number] - [place], [place] [address] [place] [address] [place] [employer] [place] [address] Phone: [phone] Fax: [phone]     Social Determinants of Health (SDOH) Interventions    Readmission Risk Interventions     No data to display

## 2022-07-10 NOTE — Hospital Course (Signed)
Patient is a pleasant 51 year old gentleman history of type 2 diabetes poorly controlled, hypertension, nonischemic cardiomyopathy, chronic systolic heart failure, presenting to the ED with polydipsia, generalized fatigue for the past 3 to 4 days with some blurry vision.  Patient noted to have unquenchable thirst, polyuria.  Patient also noted to have an upset stomach taking lots of Pepto-Bismol.  Patient presented to the ED with no chest pain no dyspnea, no fevers no sick contacts.  Did endorse some dysuria.  As patient had no specific clinical improvement presented to the ED.  Patient noted to have not had insurance and as such had been out of his Levemir for over a year.  Patient noted to have been taking his metformin but had recently run out of that.  Patient seen in the ED workup concerning for DKA.  Patient admitted and placed on the Endo tool.

## 2022-07-10 NOTE — Assessment & Plan Note (Addendum)
-  Patient with prior history of hypertension however patient with with hypotension initially on presentation which have improved with IV albumin. -Urinalysis concerning for possible UTI, urine cultures with multiple species noted. -Status post 3 days IV Rocephin. -Cardiac enzymes negative x 2. -Patient with no bleeding.  -Patient with low blood pressure 07/10/2022, however was alert, mentating well, noted to have some lightheadedness. -Patient with known ischemic cardiomyopathy, last EF on 2D echo was 30 to 35%, grade 1 diastolic dysfunction and as such need to monitor closely for volume overload and hesitant to give significant fluid boluses. -BP improved was no longer hypotensive after IV albumin given. -Coreg 25 mg twice daily resumed as well as Sherryll Burger. -Outpatient follow-up with PCP and cardiologist.

## 2022-07-10 NOTE — Assessment & Plan Note (Signed)
-  See DKA above. -Hemoglobin A1c pending.  -Patient noted to have been on long-acting insulin prior to admission in addition to metformin however due to insurance issues has been out of his long-acting insulin/Levemir for greater than a year. -DKA treated and resolved.   -Placed on 70/30 as more affordable.  -Diabetes coordinator following. -Consulted with TOC for help with medications.

## 2022-07-10 NOTE — Assessment & Plan Note (Signed)
-  Secondary to DKA/hyperglycemia. -Sodium at 141.

## 2022-07-11 DIAGNOSIS — N179 Acute kidney failure, unspecified: Secondary | ICD-10-CM | POA: Diagnosis not present

## 2022-07-11 DIAGNOSIS — E1110 Type 2 diabetes mellitus with ketoacidosis without coma: Secondary | ICD-10-CM | POA: Diagnosis not present

## 2022-07-11 DIAGNOSIS — N390 Urinary tract infection, site not specified: Secondary | ICD-10-CM | POA: Insufficient documentation

## 2022-07-11 DIAGNOSIS — I5020 Unspecified systolic (congestive) heart failure: Secondary | ICD-10-CM | POA: Diagnosis not present

## 2022-07-11 DIAGNOSIS — N3000 Acute cystitis without hematuria: Secondary | ICD-10-CM

## 2022-07-11 DIAGNOSIS — E860 Dehydration: Secondary | ICD-10-CM | POA: Diagnosis not present

## 2022-07-11 LAB — COMPREHENSIVE METABOLIC PANEL
ALT: 15 U/L (ref 0–44)
AST: 15 U/L (ref 15–41)
Albumin: 2.8 g/dL — ABNORMAL LOW (ref 3.5–5.0)
Alkaline Phosphatase: 40 U/L (ref 38–126)
Anion gap: 8 (ref 5–15)
BUN: 14 mg/dL (ref 6–20)
CO2: 20 mmol/L — ABNORMAL LOW (ref 22–32)
Calcium: 8.2 mg/dL — ABNORMAL LOW (ref 8.9–10.3)
Chloride: 111 mmol/L (ref 98–111)
Creatinine, Ser: 1.1 mg/dL (ref 0.61–1.24)
GFR, Estimated: >60 mL/min (ref >60)
Glucose, Bld: 222 mg/dL — ABNORMAL HIGH (ref 70–99)
Potassium: 3.5 mmol/L (ref 3.5–5.1)
Sodium: 139 mmol/L (ref 135–145)
Total Bilirubin: 0.9 mg/dL (ref 0.3–1.2)
Total Protein: 5.3 g/dL — ABNORMAL LOW (ref 6.5–8.1)

## 2022-07-11 LAB — CBC WITH DIFFERENTIAL/PLATELET
Abs Immature Granulocytes: 0.03 10*3/uL (ref 0.00–0.07)
Basophils Absolute: 0 10*3/uL (ref 0.0–0.1)
Basophils Relative: 0 %
Eosinophils Absolute: 0.1 10*3/uL (ref 0.0–0.5)
Eosinophils Relative: 1 %
HCT: 39 % (ref 39.0–52.0)
Hemoglobin: 13.1 g/dL (ref 13.0–17.0)
Immature Granulocytes: 0 %
Lymphocytes Relative: 27 %
Lymphs Abs: 1.9 10*3/uL (ref 0.7–4.0)
MCH: 27 pg (ref 26.0–34.0)
MCHC: 33.6 g/dL (ref 30.0–36.0)
MCV: 80.2 fL (ref 80.0–100.0)
Monocytes Absolute: 0.6 10*3/uL (ref 0.1–1.0)
Monocytes Relative: 9 %
Neutro Abs: 4.4 10*3/uL (ref 1.7–7.7)
Neutrophils Relative %: 63 %
Platelets: 227 10*3/uL (ref 150–400)
RBC: 4.86 MIL/uL (ref 4.22–5.81)
RDW: 13.6 % (ref 11.5–15.5)
WBC: 7 10*3/uL (ref 4.0–10.5)
nRBC: 0 % (ref 0.0–0.2)

## 2022-07-11 LAB — LIPID PANEL
Cholesterol: 134 mg/dL (ref 0–200)
HDL: 21 mg/dL — ABNORMAL LOW (ref >40)
LDL Cholesterol: 75 mg/dL (ref 0–99)
Total CHOL/HDL Ratio: 6.4 RATIO
Triglycerides: 192 mg/dL — ABNORMAL HIGH (ref <150)
VLDL: 38 mg/dL (ref 0–40)

## 2022-07-11 LAB — GLUCOSE, CAPILLARY
Glucose-Capillary: 216 mg/dL — ABNORMAL HIGH (ref 70–99)
Glucose-Capillary: 292 mg/dL — ABNORMAL HIGH (ref 70–99)
Glucose-Capillary: 276 mg/dL — ABNORMAL HIGH (ref 70–99)
Glucose-Capillary: 160 mg/dL — ABNORMAL HIGH (ref 70–99)

## 2022-07-11 LAB — URINE CULTURE

## 2022-07-11 LAB — MAGNESIUM: Magnesium: 1.7 mg/dL (ref 1.7–2.4)

## 2022-07-11 MED ORDER — LIDOCAINE VISCOUS HCL 2 % MT SOLN: 15.0000 mL | Freq: Four times a day (QID) | OROMUCOSAL | Status: DC | PRN

## 2022-07-11 MED ORDER — CARVEDILOL 25 MG PO TABS
25.0000 mg | ORAL_TABLET | Freq: Two times a day (BID) | ORAL | Status: DC
  Administered 2022-07-11 – 2022-07-13 (×5): 25 mg via ORAL
  Filled 2022-07-11: qty 1
  Filled 2022-07-11 (×2): qty 2
  Filled 2022-07-11: qty 1
  Filled 2022-07-11: qty 2

## 2022-07-11 MED ORDER — INSULIN GLARGINE-YFGN 100 UNIT/ML ~~LOC~~ SOLN
22.0000 [IU] | Freq: Every day | SUBCUTANEOUS | Status: DC
  Filled 2022-07-11: qty 0.22

## 2022-07-11 MED ORDER — MAGNESIUM SULFATE 4 GM/100ML IV SOLN
4.0000 g | Freq: Once | INTRAVENOUS | Status: AC
  Administered 2022-07-11: 4 g via INTRAVENOUS
  Filled 2022-07-11: qty 100

## 2022-07-11 MED ORDER — INSULIN GLARGINE-YFGN 100 UNIT/ML ~~LOC~~ SOLN
20.0000 [IU] | Freq: Every day | SUBCUTANEOUS | Status: DC
  Filled 2022-07-11: qty 0.2

## 2022-07-11 MED ORDER — PANTOPRAZOLE SODIUM 40 MG PO TBEC
40.0000 mg | DELAYED_RELEASE_TABLET | Freq: Every day | ORAL | Status: DC
  Administered 2022-07-11 – 2022-07-13 (×3): 40 mg via ORAL
  Filled 2022-07-11 (×3): qty 1

## 2022-07-11 MED ORDER — ALUM & MAG HYDROXIDE-SIMETH 200-200-20 MG/5ML PO SUSP: 30.0000 mL | Freq: Four times a day (QID) | ORAL | Status: DC | PRN

## 2022-07-11 MED ORDER — PHENAZOPYRIDINE HCL 200 MG PO TABS
200.0000 mg | ORAL_TABLET | Freq: Three times a day (TID) | ORAL | Status: DC
  Administered 2022-07-11 – 2022-07-13 (×7): 200 mg via ORAL
  Filled 2022-07-11 (×9): qty 1

## 2022-07-11 MED ORDER — INSULIN ASPART PROT & ASPART (70-30 MIX) 100 UNIT/ML ~~LOC~~ SUSP
20.0000 [IU] | Freq: Two times a day (BID) | SUBCUTANEOUS | Status: DC
  Administered 2022-07-12: 20 [IU] via SUBCUTANEOUS
  Filled 2022-07-11: qty 10

## 2022-07-11 MED ORDER — INSULIN ASPART PROT & ASPART (70-30 MIX) 100 UNIT/ML ~~LOC~~ SUSP
16.0000 [IU] | Freq: Two times a day (BID) | SUBCUTANEOUS | Status: DC
  Administered 2022-07-11 (×2): 16 [IU] via SUBCUTANEOUS
  Filled 2022-07-11: qty 10

## 2022-07-11 MED ORDER — POTASSIUM CHLORIDE CRYS ER 10 MEQ PO TBCR
40.0000 meq | EXTENDED_RELEASE_TABLET | Freq: Once | ORAL | Status: AC
  Administered 2022-07-11: 40 meq via ORAL
  Filled 2022-07-11: qty 4

## 2022-07-11 NOTE — Assessment & Plan Note (Addendum)
Urine culture with multiple species. -Status post 3 days of IV Rocephin.   -Received a 3-day course of Pyridium.   -No further antibiotics needed on discharge.

## 2022-07-11 NOTE — Assessment & Plan Note (Signed)
-  Hydrated with IV fluids. -Was euvolemic by day of discharge.

## 2022-07-11 NOTE — Progress Notes (Signed)
PROGRESS NOTE    Nicholas Knight  LOV:[id number] DOB: [birth year] DOA: 07/09/2022 PCP: [name], DO    Chief Complaint  Patient presents with   Emesis    Brief Narrative:  Patient is a pleasant 51 year old gentleman history of type 2 diabetes poorly controlled, hypertension, nonischemic cardiomyopathy, chronic systolic heart failure, presenting to the ED with polydipsia, generalized fatigue for the past 3 to 4 days with some blurry vision.  Patient noted to have unquenchable thirst, polyuria.  Patient also noted to have an upset stomach taking lots of Pepto-Bismol.  Patient presented to the ED with no chest pain no dyspnea, no fevers no sick contacts.  Did endorse some dysuria.  As patient had no specific clinical improvement presented to the ED.  Patient noted to have not had insurance and as such had been out of his Levemir for over a year.  Patient noted to have been taking his metformin but had recently run out of that.  Patient seen in the ED workup concerning for DKA.  Patient admitted and placed on the Endo tool.    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 -Urinalysis done on admission with glycosuria, moderate leukocytes, 100 protein, rare bacteria, WBC > 50, 80 ketones noted. -Comprehensive metabolic profile done with a sodium of 131, bicarb of 9, chloride of 93, glucose of 597, creatinine of 2.39 with a BUN of  39, anion gap of 29. -CBC with a leukocytosis. -Beta hydroxybutyrate was > 8. -Patient placed on the Endo tool with serial BMET, bowel rest. -DKA improved and anion gap is closed.  Acidosis improved with bicarb at 20 this morning.   -Patient transitioned yesterday evening of Endo tool on Semglee 18 units daily this morning.   -Hemoglobin A1c pending.  -CBG 216 this morning.  -Discontinue Semglee and placed on 70/3016 units twice daily (due to insurance issues, more affordable). -Continue SSI.  -Urinalysis concerning for UTI, urine cultures pending.  Continue empiric IV Rocephin.  -EKG with no significant ischemic changes noted however patient presented in DKA, history of nonischemic cardiomyopathy, systolic blood pressures in the 80s and as such cardiac enzymes were cycled which were negative.  -Diabetes coordinator consulted and following. -Consulted with TOC for help with medication postdischarge.  UTI (urinary tract infection) Urine culture is pending. -Pyridium. -IV Rocephin.  Dehydration -Hydrated with IV fluids. -Currently euvolemic. -Saline lock IV fluids.  Hypotension ??  Etiology. -Patient noted the night of admission, 12 /8-84/ 8 to have systolic blood sugars in the mid to high 80s with a MAP of 55. -Patient with no overt bleeding, hemoglobin stable at 13.1 today. -Patient with a leukocytosis white count of 13.9 initially that has improved currently at 7.0, urinalysis concerning for UTI. -Urine cultures pending. -Blood cultures pending. -Chest x-ray no acute infiltrate. -Cardiac enzymes negative x 2. -2D echo with EF, 40 to 45%, left ventricular global wall hypokinesis -Status post fluid bolus, IV albumin with resolution of hypotension. -Continue empiric  IV Rocephin pending urine culture results.  Pseudohyponatremia -Secondary to DKA/hyperglycemia. -Sodium at 139.  Hyperlipidemia -Patient noted to have had a fasting lipid panel 10/17/2020 with a triglycerides of  171, HDL of 30, total cholesterol of 109, LDL of 50. -Repeat fasting lipid panel with total cholesterol 134, HDL of 21, LDL of 75, triglycerides of 192. -Continue atorvastatin 80 mg daily. -Outpatient follow-up with cardiology.  Nonischemic dilated cardiomyopathy ([place]) -Patient noted with a history of nonischemic dilated cardiomyopathy. -2D echo from 05/03/2020 with EF of 30 to 35%, left ventricular global hypokinesis, mild concentric LVH, grade 1 diastolic dysfunction. -Patient noted to have had a cardiac catheterization done 11/28/2018 with clean coronaries noted, dilated left ventricle with EF of 15%. -Patient presenting in DKA, cardiac enzymes negative x 2.  -Repeat 2D echo with EF of 40 to 45%, global hypokinesis of left ventricular wall, grade 1 diastolic dysfunction.  -Resume home regimen Coreg as blood pressure has improved.  -Continue to hold Entresto, Lasix and Aldactone at this time and monitor BP. -Needs outpatient follow-up back with cardiologist once patient is stabilized and discharged.  DM2 (diabetes mellitus, type 2) ([place]) -See DKA above. -Hemoglobin A1c pending.  -Patient noted to have been on long-acting insulin prior to admission in addition to metformin however due to insurance issues has been out of his long-acting insulin/Levemir for greater than a year. -DKA treated and resolved.   -Placed on 70/30 as more affordable.  -Diabetes coordinator following. -Consulted with TOC for help with medications.  AKI (acute kidney injury) ([place]) -Patient on presentation with acute renal failure with a creatinine of 2.39. -Likely secondary to prerenal azotemia as patient noted to be hypotensive with systolic blood pressures in the 80s also noted in DKA and also patient noted to be on Entresto prior to admission. -Continue to hold Entresto.. -Renal function improved with hydration. -Follow.  Hypertension -Patient with prior history of hypertension however patient with with  hypotension initially on presentation which have improved with IV albumin. -Urinalysis concerning for possible UTI, urine cultures pending, continue empiric IV Rocephin. -Cardiac enzymes negative x 2. -Patient with no bleeding.  -Patient with low blood pressure yesterday, however alert, mentating well, noted to have some lightheadedness. -Patient with known ischemic cardiomyopathy, last EF on 2D echo was 30 to 41%, grade 1 diastolic dysfunction and as such need to monitor closely for volume overload and hesitant to give significant fluid boluses. -Saline lock IV fluids. -Resume home regimen of Coreg 25 mg twice daily.         DVT prophylaxis: Lovenox Code Status: Full Family Communication: Updated patient.  No family at bedside. Disposition: Transfer to telemetry.  Likely home once clinically improved.  Status is: Inpatient Remains inpatient appropriate because: Severity of illness   Consultants:  None  Procedures:  Chest x-ray 07/10/2022 Echo 07/10/2022  Antimicrobials:  IV Rocephin 07/10/2022 >>>>   Subjective: Sitting up in bed.  Overall feels much better than he did on admission.  Polydipsia has improved.  Polyuria has improved.  Lightheadedness improved.  Complain of some indigestion/nausea.  Tolerated current diet.  Denies any chest pain.  No shortness of breath.  Still with some complaints of dysuria.    Objective: Vitals:   07/11/22 0810 07/11/22 0830 07/11/22 0842 07/11/22 0900  BP:      Pulse: 90 85  91  Resp: 16 17  17   Temp:   98.6 F (37 C)   TempSrc:   Oral   SpO2: 100% 99%  100%  Weight:      Height:        Intake/Output Summary (Last 24 hours) at 07/11/2022 1015 Last data filed at 07/11/2022 0928 Gross per 24 hour  Intake 3167.66 ml  Output 700 ml  Net 2467.66 ml   Filed Weights   07/10/22 0300 07/10/22 0829 07/11/22 0500  Weight: 82.1 kg 86 kg 86.6 kg    Examination:  General exam: NAD. Respiratory system: CTAB.  No wheezes, no crackles,  no rhonchi.  Fair air movement.  Speaking in full sentences. Cardiovascular system: RRR no murmurs rubs or gallops.  No JVD.  No lower extremity edema.  Gastrointestinal system: Abdomen is soft, nontender, nondistended, positive bowel sounds.  No rebound.  No guarding. Central nervous system: Alert and oriented. No focal neurological deficits. Extremities: Symmetric 5 x 5 power. Skin: No rashes, lesions or ulcers Psychiatry: Judgement and insight appear normal. Mood & affect appropriate.     Data Reviewed:   CBC: Recent Labs  Lab 07/09/22 1350 07/10/22 0248 07/11/22 0300  WBC 13.6* 13.9* 7.0  NEUTROABS  --   --  4.4  HGB 18.7* 15.0 13.1  HCT 59.8* 45.5 39.0  MCV 85.1 80.8 80.2  PLT 426* 278 329    Basic Metabolic Panel: Recent Labs  Lab 07/10/22 0439 07/10/22 0840 07/10/22 1234 07/10/22 1645 07/11/22 0300  NA 133* 140 139 141 139  K 5.1 3.6 3.4* 3.1* 3.5  CL 110 113* 112* 112* 111  CO2 14* 17* 19* 19* 20*  GLUCOSE 204* 194* 162* 168* 222*  BUN 25* 24* 22* 18 14  CREATININE 1.30* 1.12 1.15 1.16 1.10  CALCIUM 8.2* 8.4* 8.4* 8.5* 8.2*  MG  --   --   --   --  1.7    GFR: Estimated Creatinine Clearance: 84.7 mL/min (by C-G formula based on SCr of 1.1 mg/dL).  Liver Function Tests: Recent Labs  Lab 07/09/22 1350 07/11/22 0300  AST 20 15  ALT 28 15  ALKPHOS 93 40  BILITOT 1.4* 0.9  PROT 7.8 5.3*  ALBUMIN 3.8 2.8*    CBG: Recent Labs  Lab 07/10/22 1812 07/10/22 1907 07/10/22 1958 07/10/22 2206 07/11/22 0832  GLUCAP 171* 167* 187* 236* 216*     Recent Results (from the past 240 hour(s))  MRSA Next Gen by PCR, Nasal     Status: None   Collection Time: 07/10/22 12:49 AM   Specimen: Nasal Mucosa; Nasal Swab  Result Value Ref Range Status   MRSA by PCR Next Gen NOT DETECTED NOT DETECTED Final    Comment: (NOTE) The GeneXpert MRSA Assay (FDA approved for NASAL specimens only), is one component of a comprehensive MRSA colonization  surveillance program. It is not intended to diagnose MRSA infection nor to guide or monitor treatment for MRSA infections. Test performance is not FDA approved in patients less than 36 years old. Performed at [facility], [place] [address], [place], [place] [address]   Culture, blood (Routine X 2) w Reflex to ID Panel     Status: None (Preliminary result)   Collection Time: 07/10/22  8:40 AM   Specimen: BLOOD LEFT FOREARM  Result Value Ref Range Status   Specimen Description   Final    BLOOD LEFT FOREARM Performed at [facility] [address] [place], [place] [address]    Special Requests   Final    BOTTLES DRAWN AEROBIC ONLY Blood Culture adequate volume Performed at [facility] [address]  [place], [place] [address]    Culture   Final    NO GROWTH < 24 HOURS Performed at [facility], [place] [address], [place], [place] [address]    Report Status PENDING  Incomplete  Culture, blood (Routine X 2) w Reflex to ID Panel     Status: None (Preliminary result)   Collection Time: 07/10/22  8:40 AM   Specimen: BLOOD  Result Value Ref Range Status   Specimen Description   Final    BLOOD BLOOD LEFT HAND Performed at [facility] [address], [place], [place] [address]    Special Requests   Final    IN PEDIATRIC BOTTLE Blood Culture adequate volume Performed at [facility] [address] [place], [place] [address]    Culture   Final    NO GROWTH < 24 HOURS Performed at [facility] [address] [place], [place] [address]    Report Status PENDING  Incomplete         Radiology Studies: ECHOCARDIOGRAM COMPLETE  Result Date: 07/10/2022    ECHOCARDIOGRAM REPORT   Patient Name:   DEION SWIFT Date of Exam: 07/10/2022 Medical Rec #:  [id number]      Height:       69.0 in Accession #:    [id number]     Weight:       181.0 lb Date of Birth:  [birth year]      BSA:           1.980 m Patient Age:    38 years       BP:           111/70 mmHg Patient Gender: M              HR:           89 bpm. Exam Location:  Inpatient Procedure: 2D Echo, Color Doppler and Cardiac Doppler Indications:    I42.9 Cardiomyopathy (unspecified)  History:        Patient has prior history of Echocardiogram examinations, most                 recent 05/03/2020. CHF; Risk Factors:Hypertension, Diabetes and                 Dyslipidemia.  Sonographer:    Raquel Sarna Senior RDCS Referring Phys: [place]  1. Left ventricular ejection fraction, by estimation, is 40 to 45%. The left ventricle has mildly decreased function. The left ventricle demonstrates global hypokinesis. Left ventricular diastolic parameters are consistent with Grade I diastolic dysfunction (impaired relaxation).  2. Right ventricular systolic function is normal. The right ventricular size is normal. There is normal pulmonary artery systolic pressure.  3. The mitral valve is normal in structure. Trivial mitral valve regurgitation. No evidence of mitral stenosis.  4. The aortic valve is tricuspid. Aortic valve regurgitation is not visualized. No aortic stenosis is present.  5. There is borderline dilatation of the aortic root, measuring 36 mm. There is borderline dilatation of the ascending aorta, measuring 36 mm.  6. The inferior vena cava is normal in size with greater than 50% respiratory variability, suggesting right atrial pressure of 3 mmHg. FINDINGS  Left Ventricle: Left ventricular ejection fraction, by estimation, is 40 to 45%. The left ventricle has mildly decreased function. The left ventricle demonstrates global hypokinesis. The left ventricular internal cavity size was normal in size. There is  no left ventricular hypertrophy. Left ventricular diastolic parameters  are consistent with Grade I diastolic dysfunction (impaired relaxation). Normal left ventricular filling pressure. Right Ventricle: The right ventricular  size is normal. No increase in right ventricular wall thickness. Right ventricular systolic function is normal. There is normal pulmonary artery systolic pressure. The tricuspid regurgitant velocity is 2.38 m/s, and  with an assumed right atrial pressure of 3 mmHg, the estimated right ventricular systolic pressure is 14.4 mmHg. Left Atrium: Left atrial size was normal in size. Right Atrium: Right atrial size was normal in size. Pericardium: There is no evidence of pericardial effusion. Mitral Valve: The mitral valve is normal in structure. Trivial mitral valve regurgitation. No evidence of mitral valve stenosis. Tricuspid Valve: The tricuspid valve is normal in structure. Tricuspid valve regurgitation is trivial. No evidence of tricuspid stenosis. Aortic Valve: The aortic valve is tricuspid. Aortic valve regurgitation is not visualized. No aortic stenosis is present. Pulmonic Valve: The pulmonic valve was normal in structure. Pulmonic valve regurgitation is not visualized. No evidence of pulmonic stenosis. Aorta: The aortic root is normal in size and structure. There is borderline dilatation of the aortic root, measuring 36 mm. There is borderline dilatation of the ascending aorta, measuring 36 mm. Venous: The inferior vena cava is normal in size with greater than 50% respiratory variability, suggesting right atrial pressure of 3 mmHg. IAS/Shunts: No atrial level shunt detected by color flow Doppler.  LEFT VENTRICLE PLAX 2D LVIDd:         4.20 cm   Diastology LVIDs:         3.10 cm   LV e' medial:    7.18 cm/s LV PW:         1.00 cm   LV E/e' medial:  5.3 LV IVS:        0.80 cm   LV e' lateral:   8.49 cm/s LVOT diam:     2.40 cm   LV E/e' lateral: 4.4 LV SV:         63 LV SV Index:   32 LVOT Area:     4.52 cm  RIGHT VENTRICLE RV S prime:     12.90 cm/s TAPSE (M-mode): 1.9 cm LEFT ATRIUM           Index        RIGHT ATRIUM           Index LA diam:      3.40 cm 1.72 cm/m   RA Area:     10.90 cm LA Vol (A2C): 40.0  ml 20.20 ml/m  RA Volume:   20.10 ml  10.15 ml/m LA Vol (A4C): 40.8 ml 20.60 ml/m  AORTIC VALVE LVOT Vmax:   87.80 cm/s LVOT Vmean:  59.800 cm/s LVOT VTI:    0.138 m  AORTA Ao Root diam: 3.60 cm Ao Asc diam:  3.60 cm MITRAL VALVE               TRICUSPID VALVE MV Area (PHT): 2.94 cm    TR Peak grad:   22.7 mmHg MV Decel Time: 258 msec    TR Vmax:        238.00 cm/s MV E velocity: 37.70 cm/s MV A velocity: 57.00 cm/s  SHUNTS MV E/A ratio:  0.66        Systemic VTI:  0.14 m                            Systemic Diam: 2.40 cm Skeet Latch MD Electronically signed  by Skeet Latch MD Signature Date/Time: 07/10/2022/3:43:38 PM    Final    DG CHEST PORT 1 VIEW  Result Date: 07/10/2022 CLINICAL DATA:  Leukocytosis EXAM: PORTABLE CHEST 1 VIEW COMPARISON:  Chest x-ray dated November 26, 2018 FINDINGS: Heart size and mediastinal contours are within normal limits for AP technique. Both lungs are clear. The visualized skeletal structures are unremarkable. IMPRESSION: No active disease. Electronically Signed   By: Yetta Glassman M.D.   On: 07/10/2022 09:26   DG Abd 1 View  Result Date: 07/09/2022 CLINICAL DATA:  Constipation.  Abdominal pain, nausea and vomiting. EXAM: ABDOMEN - 1 VIEW COMPARISON:  CT AP 07/29/2020 FINDINGS: Diffuse gaseous distension of the gastric lumen is identified. No dilated small bowel loops identified. Moderate stool burden is noted throughout the colon up to the level of the rectum. IMPRESSION: 1. Diffuse gaseous distension of the gastric lumen. 2. Moderate stool burden throughout the colon. Electronically Signed   By: Kerby Moors M.D.   On: 07/09/2022 17:56        Scheduled Meds:  atorvastatin  80 mg Oral q1800   carvedilol  25 mg Oral BID WC   Chlorhexidine Gluconate Cloth  6 each Topical Daily   enoxaparin (LOVENOX) injection  40 mg Subcutaneous Q24H   insulin aspart  0-5 Units Subcutaneous QHS   insulin aspart  0-9 Units Subcutaneous TID WC   insulin aspart protamine-  aspart  16 Units Subcutaneous BID WC   pantoprazole  40 mg Oral Daily   phenazopyridine  200 mg Oral TID WC   sodium bicarbonate  650 mg Oral BID   Continuous Infusions:  albumin human Stopped (07/11/22 0524)   cefTRIAXone (ROCEPHIN)  IV Stopped (07/10/22 1415)   magnesium sulfate bolus IVPB 50 mL/hr at 07/11/22 0928     LOS: 2 days    Time spent: 50 minutes    Irine Seal, MD [facility]   To contact the attending provider between 7A-7P or the covering provider during after hours 7P-7A, please log into the web site [email or web address] and access using universal [place] password for that web site. If you do not have the password, please call the hospital operator.  07/11/2022, 10:15 AM

## 2022-07-11 NOTE — Inpatient Diabetes Management (Signed)
Inpatient Diabetes Program Recommendations  AACE/ADA: New Consensus Statement on Inpatient Glycemic Control   Target Ranges:  Prepandial:   less than 140 mg/dL      Peak postprandial:   less than 180 mg/dL (1-2 hours)      Critically ill patients:  140 - 180 mg/dL    Latest Reference Range & Units 07/11/22 08:32  Glucose-Capillary 70 - 99 mg/dL 216 (H)    Latest Reference Range & Units 07/10/22 15:01 07/10/22 16:45 07/10/22 18:12 07/10/22 19:07 07/10/22 19:58 07/10/22 22:06  Glucose-Capillary 70 - 99 mg/dL 184 (H) 177 (H) 171 (H) 167 (H) 187 (H) 236 (H)   Review of Glycemic Control  Diabetes history: DM2 Outpatient Diabetes medications: Levemir 30 units QHS (not taking), Metformin 1000 mg QAM, Jardiance 10 mg daily (not taking) Current orders for Inpatient glycemic control: Semglee 22 units daily, Novolog 0-9 units TID with meals, Novolog 0-5 units QHS  Inpatient Diabetes Program Recommendations:    Insulin: Since 70/30 insulin would be more affordable for patient, please consider switching to 70/30 insulin regimen while inpatient. If agreeable, please discontinue Semglee 22 units daily and order 70/30 16 units BID (dose would provide a total of 22.4 units for basal and 9.6 units for meal coverage per day.  Thanks, Barnie Alderman, RN, MSN, [place] Diabetes Coordinator Inpatient Diabetes Program [phone] (Team Pager from 8am to [place])

## 2022-07-12 ENCOUNTER — Inpatient Hospital Stay (HOSPITAL_COMMUNITY): Payer: No Typology Code available for payment source

## 2022-07-12 DIAGNOSIS — E860 Dehydration: Secondary | ICD-10-CM | POA: Diagnosis not present

## 2022-07-12 DIAGNOSIS — E876 Hypokalemia: Secondary | ICD-10-CM | POA: Insufficient documentation

## 2022-07-12 DIAGNOSIS — N179 Acute kidney failure, unspecified: Secondary | ICD-10-CM | POA: Diagnosis not present

## 2022-07-12 DIAGNOSIS — E1110 Type 2 diabetes mellitus with ketoacidosis without coma: Secondary | ICD-10-CM | POA: Diagnosis not present

## 2022-07-12 DIAGNOSIS — I5020 Unspecified systolic (congestive) heart failure: Secondary | ICD-10-CM | POA: Diagnosis not present

## 2022-07-12 LAB — GLUCOSE, CAPILLARY
Glucose-Capillary: 189 mg/dL — ABNORMAL HIGH (ref 70–99)
Glucose-Capillary: 276 mg/dL — ABNORMAL HIGH (ref 70–99)
Glucose-Capillary: 317 mg/dL — ABNORMAL HIGH (ref 70–99)
Glucose-Capillary: 107 mg/dL — ABNORMAL HIGH (ref 70–99)

## 2022-07-12 LAB — MAGNESIUM: Magnesium: 2.4 mg/dL (ref 1.7–2.4)

## 2022-07-12 LAB — CBC
HCT: 36.1 % — ABNORMAL LOW (ref 39.0–52.0)
Hemoglobin: 12.1 g/dL — ABNORMAL LOW (ref 13.0–17.0)
MCH: 26.8 pg (ref 26.0–34.0)
MCHC: 33.5 g/dL (ref 30.0–36.0)
MCV: 79.9 fL — ABNORMAL LOW (ref 80.0–100.0)
Platelets: 201 10*3/uL (ref 150–400)
RBC: 4.52 MIL/uL (ref 4.22–5.81)
RDW: 13.5 % (ref 11.5–15.5)
WBC: 6.1 10*3/uL (ref 4.0–10.5)
nRBC: 0 % (ref 0.0–0.2)

## 2022-07-12 LAB — BASIC METABOLIC PANEL
Anion gap: 6 (ref 5–15)
BUN: 15 mg/dL (ref 6–20)
CO2: 26 mmol/L (ref 22–32)
Calcium: 8.2 mg/dL — ABNORMAL LOW (ref 8.9–10.3)
Chloride: 109 mmol/L (ref 98–111)
Creatinine, Ser: 1 mg/dL (ref 0.61–1.24)
GFR, Estimated: >60 mL/min (ref >60)
Glucose, Bld: 129 mg/dL — ABNORMAL HIGH (ref 70–99)
Potassium: 3.1 mmol/L — ABNORMAL LOW (ref 3.5–5.1)
Sodium: 141 mmol/L (ref 135–145)

## 2022-07-12 MED ORDER — SACUBITRIL-VALSARTAN 24-26 MG PO TABS
1.0000 | ORAL_TABLET | Freq: Two times a day (BID) | ORAL | Status: DC
  Administered 2022-07-12 – 2022-07-13 (×2): 1 via ORAL
  Filled 2022-07-12 (×3): qty 1

## 2022-07-12 MED ORDER — INSULIN ASPART PROT & ASPART (70-30 MIX) 100 UNIT/ML ~~LOC~~ SUSP
24.0000 [IU] | Freq: Two times a day (BID) | SUBCUTANEOUS | Status: DC
  Administered 2022-07-12 – 2022-07-13 (×2): 24 [IU] via SUBCUTANEOUS
  Filled 2022-07-12 (×2): qty 10

## 2022-07-12 MED ORDER — POTASSIUM CHLORIDE CRYS ER 20 MEQ PO TBCR: 40.0000 meq | EXTENDED_RELEASE_TABLET | Freq: Once | ORAL | Status: DC

## 2022-07-12 MED ORDER — POTASSIUM CHLORIDE CRYS ER 10 MEQ PO TBCR
40.0000 meq | EXTENDED_RELEASE_TABLET | ORAL | Status: AC
  Administered 2022-07-12 (×2): 40 meq via ORAL
  Filled 2022-07-12 (×2): qty 4

## 2022-07-12 MED ORDER — POTASSIUM CHLORIDE CRYS ER 20 MEQ PO TBCR: 40.0000 meq | EXTENDED_RELEASE_TABLET | ORAL | Status: DC

## 2022-07-12 NOTE — Progress Notes (Signed)
PROGRESS NOTE    Nicholas Knight  GGY:[id number] DOB: 19-Apr-1971 DOA: 07/09/2022 PCP: [name], DO    Chief Complaint  Patient presents with   Emesis    Brief Narrative:  Patient is a pleasant 50 year old gentleman history of type 2 diabetes poorly controlled, hypertension, nonischemic cardiomyopathy, chronic systolic heart failure, presenting to the ED with polydipsia, generalized fatigue for the past 3 to 4 days with some blurry vision.  Patient noted to have unquenchable thirst, polyuria.  Patient also noted to have an upset stomach taking lots of Pepto-Bismol.  Patient presented to the ED with no chest pain no dyspnea, no fevers no sick contacts.  Did endorse some dysuria.  As patient had no specific clinical improvement presented to the ED.  Patient noted to have not had insurance and as such had been out of his Levemir for over a year.  Patient noted to have been taking his metformin but had recently run out of that.  Patient seen in the ED workup concerning for DKA.  Patient admitted and placed on the Endo tool.    Assessment & Plan:  Principal Problem:   DKA (diabetic ketoacidosis) ([place]) Active Problems:   Hypertension   AKI (acute kidney injury) ([place])   DM2 (diabetes mellitus, type 2) ([place])   Nonischemic dilated cardiomyopathy ([place])   Hyperlipidemia   Heart failure with reduced ejection fraction (HCC)   Diabetes mellitus without complication ([place])   Pseudohyponatremia   Hypotension   Dehydration   UTI (urinary tract infection)   Hypokalemia    Assessment and Plan: * DKA (diabetic ketoacidosis) ([place]) -Patient presented in DKA with polyuria, polydipsia, blurry vision, generalized fatigue, also noted to have some dysuria. -Urinalysis done on admission with glycosuria, moderate leukocytes, 100 protein, rare bacteria, WBC > 50, 80 ketones noted. -Comprehensive metabolic profile done with a sodium of 131, bicarb of 9, chloride of 93, glucose of 597, creatinine of 2.39  with a BUN of 39, anion gap of 29. -CBC with a leukocytosis. -Beta hydroxybutyrate was > 8. -Patient placed on the Endo tool with serial BMET, bowel rest. -DKA improved and anion gap is closed.  Acidosis improved with bicarb at 26 this morning.   -Patient transitioned evening of 07/10/2022 from Endo to to [facility] 18 units daily.    -Hemoglobin A1c pending.  -CBG 189 this morning.  -Patient changed from [place] to 70/30 16 units BID (due to insurance issues, more affordable). -Continue SSI.  -Increase 70/30 to 20 units twice daily. -Urinalysis concerning for UTI, urine cultures pending.  Continue empiric IV Rocephin D3/3..  -EKG with no significant ischemic changes noted however patient presented in DKA, history of nonischemic cardiomyopathy, systolic blood pressures in the 80s and as such cardiac enzymes were cycled which were negative.  -Diabetes coordinator consulted and following. -Consulted with TOC for help with medication postdischarge.  Hypokalemia Kdur 95meq every 4 hours x 2 doses. -Repeat labs in the AM.  UTI (urinary tract infection) Urine culture with multiple species. -Continue Pyridium. -IV Rocephin D3/3.Marland Kitchen  Dehydration -Hydrated with IV fluids. -Currently euvolemic. -Saline lock IV fluids.  Hypotension ??  Etiology. -Patient noted the night of admission, 12 /0-35/ 8 to have systolic blood sugars in the mid to high 80s with a MAP of 55. -Patient with no overt bleeding, hemoglobin stable at 12.1 today. -Patient with a leukocytosis white count of 13.9 initially that has improved currently at 6.1, urinalysis concerning for UTI. -Urine cultures with multiple species present. -Blood cultures pending. -Chest x-ray  no acute infiltrate. -Cardiac enzymes negative x 2. -2D echo with EF, 40 to 45%, left ventricular global wall hypokinesis -Status post fluid bolus, IV albumin with resolution of hypotension. -Continue empiric IV Rocephin D3/3.    Pseudohyponatremia -Secondary to DKA/hyperglycemia. -Sodium at 141.  Hyperlipidemia -Patient noted to have had a fasting lipid panel 10/17/2020 with a triglycerides of 171, HDL of 30, total cholesterol of 109, LDL of 50. -Repeat fasting lipid panel with total cholesterol 134, HDL of 21, LDL of 75, triglycerides of 192. -Continue atorvastatin 80 mg daily. -Outpatient follow-up with cardiology.  Nonischemic dilated cardiomyopathy ([place]) -Patient noted with a history of nonischemic dilated cardiomyopathy. -2D echo from 05/03/2020 with EF of 30 to 35%, left ventricular global hypokinesis, mild concentric LVH, grade 1 diastolic dysfunction. -Patient noted to have had a cardiac catheterization done 11/28/2018 with clean coronaries noted, dilated left ventricle with EF of 15%. -Patient presenting in DKA, cardiac enzymes negative x 2.  -Repeat 2D echo with EF of 40 to 45%, global hypokinesis of left ventricular wall, grade 1 diastolic dysfunction.  -Continue home regimen Coreg as blood pressure has improved.  -Continue to hold Lasix and Aldactone at this time and monitor BP. -Resume Entresto at lowest dose. -Needs outpatient follow-up back with cardiologist once patient is stabilized and discharged. -Will need help with medications.  DM2 (diabetes mellitus, type 2) ([place]) -See DKA above. -Hemoglobin A1c pending.  -Patient noted to have been on long-acting insulin prior to admission in addition to metformin however due to insurance issues has been out of his long-acting insulin/Levemir for greater than a year. -DKA treated and resolved.   -Continue 70/30 as more affordable.  -Diabetes coordinator following. -Consulted with TOC for help with medications.  AKI (acute kidney injury) ([place]) -Patient on presentation with acute renal failure with a creatinine of 2.39. -Likely secondary to prerenal azotemia as patient noted to be hypotensive with systolic blood pressures in the 80s also noted in DKA  and also patient noted to be on Entresto prior to admission. -Renal function improved and seems to have normalized. -Place back on Entresto at lowest dose and monitor renal function closely. -Follow.  Hypertension -Patient with prior history of hypertension however patient with with hypotension initially on presentation which have improved with IV albumin. -Urinalysis concerning for possible UTI, urine cultures pending, continue empiric IV Rocephin. -Cardiac enzymes negative x 2. -Patient with no bleeding.  -Patient with low blood pressure 07/10/2022, however was alert, mentating well, noted to have some lightheadedness. -Patient with known ischemic cardiomyopathy, last EF on 2D echo was 30 to 16%, grade 1 diastolic dysfunction and as such need to monitor closely for volume overload and hesitant to give significant fluid boluses. -Saline lock IV fluids. -Home regimen Coreg 25 mg twice daily resumed. -Resume Entresto.          DVT prophylaxis: Lovenox Code Status: Full Family Communication: Updated patient.  No family at bedside. Disposition: Awaiting telemetry bed.  Likely home in 24 hours.   Status is: Inpatient Remains inpatient appropriate because: Severity of illness   Consultants:  None  Procedures:  Chest x-ray 07/10/2022 Echo 07/10/2022  Antimicrobials:  IV Rocephin 07/10/2022 >>>> 07/12/2022   Subjective: Sitting up in bed.  Overall feels well.  Dysuria is improved.  Polydipsia has improved.  Polyuria is improved.  Denies any chest pain or shortness of breath.  Heartburn improved.  Tolerating current diet.    Objective: Vitals:   07/12/22 0750 07/12/22 0800 07/12/22 0847 07/12/22 0900  BP:   136/86   Pulse: 76 80 80 92  Resp: 16 16 14 20   Temp: 98.2 F (36.8 C)     TempSrc: Oral     SpO2: 100% 99% 100% 99%  Weight:      Height:        Intake/Output Summary (Last 24 hours) at 07/12/2022 1037 Last data filed at 07/11/2022 1657 Gross per 24 hour  Intake  896.17 ml  Output 250 ml  Net 646.17 ml   Filed Weights   07/10/22 0829 07/11/22 0500 07/12/22 0500  Weight: 86 kg 86.6 kg 86.2 kg    Examination:  General exam: NAD. Respiratory system: Lungs clear to auscultation bilaterally.  No wheezes, no crackles, no rhonchi.  Fair air movement.  Speaking in full sentences.   Cardiovascular system: Regular rate rhythm no murmurs rubs or gallops.  No JVD.  No lower extremity edema.   Gastrointestinal system: Abdomen is soft, nontender, nondistended, positive bowel sounds.  No rebound.  No guarding.  Central nervous system: Alert and oriented. No focal neurological deficits. Extremities: Symmetric 5 x 5 power. Skin: No rashes, lesions or ulcers Psychiatry: Judgement and insight appear normal. Mood & affect appropriate.     Data Reviewed:   CBC: Recent Labs  Lab 07/09/22 1350 07/10/22 0248 07/11/22 0300 07/12/22 0257  WBC 13.6* 13.9* 7.0 6.1  NEUTROABS  --   --  4.4  --   HGB 18.7* 15.0 13.1 12.1*  HCT 59.8* 45.5 39.0 36.1*  MCV 85.1 80.8 80.2 79.9*  PLT 426* 278 227 976    Basic Metabolic Panel: Recent Labs  Lab 07/10/22 0840 07/10/22 1234 07/10/22 1645 07/11/22 0300 07/12/22 0257  NA 140 139 141 139 141  K 3.6 3.4* 3.1* 3.5 3.1*  CL 113* 112* 112* 111 109  CO2 17* 19* 19* 20* 26  GLUCOSE 194* 162* 168* 222* 129*  BUN 24* 22* 18 14 15   CREATININE 1.12 1.15 1.16 1.10 1.00  CALCIUM 8.4* 8.4* 8.5* 8.2* 8.2*  MG  --   --   --  1.7 2.4    GFR: Estimated Creatinine Clearance: 92.9 mL/min (by C-G formula based on SCr of 1 mg/dL).  Liver Function Tests: Recent Labs  Lab 07/09/22 1350 07/11/22 0300  AST 20 15  ALT 28 15  ALKPHOS 93 40  BILITOT 1.4* 0.9  PROT 7.8 5.3*  ALBUMIN 3.8 2.8*    CBG: Recent Labs  Lab 07/11/22 0832 07/11/22 1227 07/11/22 1617 07/11/22 2129 07/12/22 0745  GLUCAP 216* 292* 276* 160* 189*     Recent Results (from the past 240 hour(s))  Urine Culture     Status: Abnormal    Collection Time: 07/09/22  3:44 PM   Specimen: Urine, Catheterized  Result Value Ref Range Status   Specimen Description   Final    URINE, CATHETERIZED Performed at [facility] [address], [place], [place] [address]    Special Requests   Final    NONE Performed at [facility], [place] [address], [place], [place] [address]    Culture MULTIPLE SPECIES PRESENT, SUGGEST RECOLLECTION (A)  Final   Report Status 07/11/2022 FINAL  Final  MRSA Next Gen by PCR, Nasal     Status: None   Collection Time: 07/10/22 12:49 AM   Specimen: Nasal Mucosa; Nasal Swab  Result Value Ref Range Status   MRSA by PCR Next Gen NOT DETECTED NOT DETECTED Final    Comment: (NOTE) The GeneXpert MRSA  Assay (FDA approved for NASAL specimens only), is one component of a comprehensive MRSA colonization surveillance program. It is not intended to diagnose MRSA infection nor to guide or monitor treatment for MRSA infections. Test performance is not FDA approved in patients less than 78 years old. Performed at [facility], [place] [address], [place], [place] [address]   Culture, blood (Routine X 2) w Reflex to ID Panel     Status: None (Preliminary result)   Collection Time: 07/10/22  8:40 AM   Specimen: BLOOD LEFT FOREARM  Result Value Ref Range Status   Specimen Description   Final    BLOOD LEFT FOREARM Performed at [facility] [address] [place], [place] [address]    Special Requests   Final    BOTTLES DRAWN AEROBIC ONLY Blood Culture adequate volume Performed at [facility] [address] [place], [place] [address]    Culture   Final    NO GROWTH < 24 HOURS Performed at [facility] [address], [place], [place] [address]    Report Status PENDING  Incomplete  Culture, blood (Routine X 2) w Reflex to ID Panel     Status: None (Preliminary result)   Collection Time: 07/10/22  8:40 AM    Specimen: BLOOD  Result Value Ref Range Status   Specimen Description   Final    BLOOD BLOOD LEFT HAND Performed at [facility] [address], [place], [place] [address]    Special Requests   Final    IN PEDIATRIC BOTTLE Blood Culture adequate volume Performed at [facility] [address] [place], [place] [address]    Culture   Final    NO GROWTH < 24 HOURS Performed at [facility] [address] [place], [place] [address]    Report Status PENDING  Incomplete         Radiology Studies: ECHOCARDIOGRAM COMPLETE  Result Date: 07/10/2022    ECHOCARDIOGRAM REPORT   Patient Name:   DAVINDER HAFF Date of Exam: 07/10/2022 Medical Rec #:  [id number]      Height:       69.0 in Accession #:    [id number]     Weight:       181.0 lb Date of Birth:  Dec 10, 1970      BSA:          1.980 m Patient Age:    1 years       BP:           111/70 mmHg Patient Gender: M              HR:           89 bpm. Exam Location:  Inpatient Procedure: 2D Echo, Color Doppler and Cardiac Doppler Indications:    I42.9 Cardiomyopathy (unspecified)  History:        Patient has prior history of Echocardiogram examinations, most                 recent 05/03/2020. CHF; Risk Factors:Hypertension, Diabetes and                 Dyslipidemia.  Sonographer:    Raquel Sarna Senior RDCS Referring Phys: [place]  1. Left ventricular ejection fraction, by estimation, is 40 to 45%. The left ventricle has mildly decreased function. The left ventricle demonstrates global hypokinesis. Left ventricular diastolic parameters are consistent with Grade I diastolic  dysfunction (impaired relaxation).  2. Right ventricular systolic function is normal. The right ventricular size is normal. There is normal pulmonary artery systolic pressure.  3. The mitral valve is normal in structure. Trivial mitral valve regurgitation. No evidence of mitral stenosis.  4. The aortic valve is tricuspid.  Aortic valve regurgitation is not visualized. No aortic stenosis is present.  5. There is borderline dilatation of the aortic root, measuring 36 mm. There is borderline dilatation of the ascending aorta, measuring 36 mm.  6. The inferior vena cava is normal in size with greater than 50% respiratory variability, suggesting right atrial pressure of 3 mmHg. FINDINGS  Left Ventricle: Left ventricular ejection fraction, by estimation, is 40 to 45%. The left ventricle has mildly decreased function. The left ventricle demonstrates global hypokinesis. The left ventricular internal cavity size was normal in size. There is  no left ventricular hypertrophy. Left ventricular diastolic parameters are consistent with Grade I diastolic dysfunction (impaired relaxation). Normal left ventricular filling pressure. Right Ventricle: The right ventricular size is normal. No increase in right ventricular wall thickness. Right ventricular systolic function is normal. There is normal pulmonary artery systolic pressure. The tricuspid regurgitant velocity is 2.38 m/s, and  with an assumed right atrial pressure of 3 mmHg, the estimated right ventricular systolic pressure is 96.7 mmHg. Left Atrium: Left atrial size was normal in size. Right Atrium: Right atrial size was normal in size. Pericardium: There is no evidence of pericardial effusion. Mitral Valve: The mitral valve is normal in structure. Trivial mitral valve regurgitation. No evidence of mitral valve stenosis. Tricuspid Valve: The tricuspid valve is normal in structure. Tricuspid valve regurgitation is trivial. No evidence of tricuspid stenosis. Aortic Valve: The aortic valve is tricuspid. Aortic valve regurgitation is not visualized. No aortic stenosis is present. Pulmonic Valve: The pulmonic valve was normal in structure. Pulmonic valve regurgitation is not visualized. No evidence of pulmonic stenosis. Aorta: The aortic root is normal in size and structure. There is borderline  dilatation of the aortic root, measuring 36 mm. There is borderline dilatation of the ascending aorta, measuring 36 mm. Venous: The inferior vena cava is normal in size with greater than 50% respiratory variability, suggesting right atrial pressure of 3 mmHg. IAS/Shunts: No atrial level shunt detected by color flow Doppler.  LEFT VENTRICLE PLAX 2D LVIDd:         4.20 cm   Diastology LVIDs:         3.10 cm   LV e' medial:    7.18 cm/s LV PW:         1.00 cm   LV E/e' medial:  5.3 LV IVS:        0.80 cm   LV e' lateral:   8.49 cm/s LVOT diam:     2.40 cm   LV E/e' lateral: 4.4 LV SV:         63 LV SV Index:   32 LVOT Area:     4.52 cm  RIGHT VENTRICLE RV S prime:     12.90 cm/s TAPSE (M-mode): 1.9 cm LEFT ATRIUM           Index        RIGHT ATRIUM           Index LA diam:      3.40 cm 1.72 cm/m   RA Area:     10.90 cm LA Vol (A2C): 40.0 ml 20.20 ml/m  RA Volume:   20.10 ml  10.15 ml/m LA Vol (A4C):  40.8 ml 20.60 ml/m  AORTIC VALVE LVOT Vmax:   87.80 cm/s LVOT Vmean:  59.800 cm/s LVOT VTI:    0.138 m  AORTA Ao Root diam: 3.60 cm Ao Asc diam:  3.60 cm MITRAL VALVE               TRICUSPID VALVE MV Area (PHT): 2.94 cm    TR Peak grad:   22.7 mmHg MV Decel Time: 258 msec    TR Vmax:        238.00 cm/s MV E velocity: 37.70 cm/s MV A velocity: 57.00 cm/s  SHUNTS MV E/A ratio:  0.66        Systemic VTI:  0.14 m                            Systemic Diam: 2.40 cm Skeet Latch MD Electronically signed by Skeet Latch MD Signature Date/Time: 07/10/2022/3:43:38 PM    Final         Scheduled Meds:  atorvastatin  80 mg Oral q1800   carvedilol  25 mg Oral BID WC   Chlorhexidine Gluconate Cloth  6 each Topical Daily   enoxaparin (LOVENOX) injection  40 mg Subcutaneous Q24H   insulin aspart  0-5 Units Subcutaneous QHS   insulin aspart  0-9 Units Subcutaneous TID WC   insulin aspart protamine- aspart  20 Units Subcutaneous BID WC   pantoprazole  40 mg Oral Daily   phenazopyridine  200 mg Oral TID WC    potassium chloride  40 mEq Oral Q4H   sacubitril-valsartan  1 tablet Oral BID   Continuous Infusions:  cefTRIAXone (ROCEPHIN)  IV Stopped (07/11/22 1333)     LOS: 3 days    Time spent: 40 minutes    Irine Seal, MD [facility]   To contact the attending provider between 7A-7P or the covering provider during after hours 7P-7A, please log into the web site [email or web address] and access using universal [place] password for that web site. If you do not have the password, please call the hospital operator.  07/12/2022, 10:37 AM

## 2022-07-12 NOTE — Assessment & Plan Note (Signed)
Kdur every 4 hours x 2 doses. -Repeat labs in the AM.

## 2022-07-12 NOTE — Plan of Care (Signed)

## 2022-07-13 DIAGNOSIS — I10 Essential (primary) hypertension: Secondary | ICD-10-CM | POA: Diagnosis not present

## 2022-07-13 DIAGNOSIS — E860 Dehydration: Secondary | ICD-10-CM | POA: Diagnosis not present

## 2022-07-13 DIAGNOSIS — E876 Hypokalemia: Secondary | ICD-10-CM

## 2022-07-13 DIAGNOSIS — E1110 Type 2 diabetes mellitus with ketoacidosis without coma: Secondary | ICD-10-CM | POA: Diagnosis not present

## 2022-07-13 DIAGNOSIS — I5020 Unspecified systolic (congestive) heart failure: Secondary | ICD-10-CM | POA: Diagnosis not present

## 2022-07-13 LAB — CBC
HCT: 38.2 % — ABNORMAL LOW (ref 39.0–52.0)
Hemoglobin: 12.6 g/dL — ABNORMAL LOW (ref 13.0–17.0)
MCH: 27 pg (ref 26.0–34.0)
MCHC: 33 g/dL (ref 30.0–36.0)
MCV: 81.8 fL (ref 80.0–100.0)
Platelets: 206 10*3/uL (ref 150–400)
RBC: 4.67 MIL/uL (ref 4.22–5.81)
RDW: 13.7 % (ref 11.5–15.5)
WBC: 5.5 10*3/uL (ref 4.0–10.5)
nRBC: 0 % (ref 0.0–0.2)

## 2022-07-13 LAB — BASIC METABOLIC PANEL
Anion gap: 7 (ref 5–15)
BUN: 17 mg/dL (ref 6–20)
CO2: 25 mmol/L (ref 22–32)
Calcium: 8.6 mg/dL — ABNORMAL LOW (ref 8.9–10.3)
Chloride: 104 mmol/L (ref 98–111)
Creatinine, Ser: 1.05 mg/dL (ref 0.61–1.24)
GFR, Estimated: >60 mL/min (ref >60)
Glucose, Bld: 199 mg/dL — ABNORMAL HIGH (ref 70–99)
Potassium: 4.2 mmol/L (ref 3.5–5.1)
Sodium: 136 mmol/L (ref 135–145)

## 2022-07-13 LAB — MAGNESIUM: Magnesium: 1.8 mg/dL (ref 1.7–2.4)

## 2022-07-13 LAB — GLUCOSE, CAPILLARY
Glucose-Capillary: 200 mg/dL — ABNORMAL HIGH (ref 70–99)
Glucose-Capillary: 230 mg/dL — ABNORMAL HIGH (ref 70–99)

## 2022-07-13 MED ORDER — NOVOLIN 70/30 FLEXPEN RELION (70-30) 100 UNIT/ML ~~LOC~~ SUPN: 25.0000 [IU] | PEN_INJECTOR | Freq: Two times a day (BID) | SUBCUTANEOUS | 1 refills | Status: AC

## 2022-07-13 MED ORDER — INSULIN ASPART PROT & ASPART (70-30 MIX) 100 UNIT/ML ~~LOC~~ SUSP: 25.0000 [IU] | Freq: Two times a day (BID) | SUBCUTANEOUS | Status: DC

## 2022-07-13 MED ORDER — METFORMIN HCL 1000 MG PO TABS: 1000.0000 mg | ORAL_TABLET | Freq: Two times a day (BID) | ORAL | 1 refills | Status: AC

## 2022-07-13 MED ORDER — CARVEDILOL 25 MG PO TABS: 25.0000 mg | ORAL_TABLET | Freq: Two times a day (BID) | ORAL | 1 refills | Status: AC

## 2022-07-13 MED ORDER — ATORVASTATIN CALCIUM 80 MG PO TABS: 80.0000 mg | ORAL_TABLET | Freq: Every day | ORAL | 1 refills | Status: AC

## 2022-07-13 MED ORDER — PANTOPRAZOLE SODIUM 40 MG PO TBEC: 40.0000 mg | DELAYED_RELEASE_TABLET | Freq: Every day | ORAL | 1 refills | Status: AC

## 2022-07-13 MED ORDER — SACUBITRIL-VALSARTAN 24-26 MG PO TABS: 1.0000 | ORAL_TABLET | Freq: Two times a day (BID) | ORAL | 1 refills | Status: AC

## 2022-07-13 MED ORDER — BD PEN NEEDLE MINI U/F 31G X 5 MM MISC: 25.0000 [IU] | Freq: Two times a day (BID) | 2 refills | Status: AC

## 2022-07-13 MED ORDER — MAGNESIUM SULFATE 2 GM/50ML IV SOLN
2.0000 g | Freq: Once | INTRAVENOUS | Status: AC
  Administered 2022-07-13: 2 g via INTRAVENOUS
  Filled 2022-07-13: qty 50

## 2022-07-13 NOTE — Inpatient Diabetes Management (Addendum)
Inpatient Diabetes Program Recommendations  AACE/ADA: New Consensus Statement on Inpatient Glycemic Control (2015)  Target Ranges:  Prepandial:   less than 140 mg/dL      Peak postprandial:   less than 180 mg/dL (1-2 hours)      Critically ill patients:  140 - 180 mg/dL   Lab Results  Component Value Date   GLUCAP 200 (H) 07/13/2022   HGBA1C 7.2 (A) 10/17/2020    Review of Glycemic Control  Diabetes history: DM2 Outpatient Diabetes medications: Levemir 30 QHS (not taking), metformin 1000 mg QAM, Jardiance 10 mg (not taking) Current orders for Inpatient glycemic control: Novolog 70/30 24 units BID, Novolog 0-9 units TID with meals and 0-5 HS  Inpatient Diabetes Program Recommendations:    For discharge:  Novolin ReliOn 70/30 24 untis BID Metformin 1000 mg BID Insulin pen needles (#[id number])  Spoke with pt at bedside regarding going home on 70/30 insulin. Instructed to inject insulin prior to breakfast meal and prior to dinner meal. Also instructed to monitor blood sugars at least 3x/day.  [facility] @ 4 pm.  Answered all questions. Pt states he wants to stay out of the hospital and will take meds as prescribed. Reviewed hypoglycemia s/s and treatment.   Thank you. Lorenda Peck, RD, LDN, [place] Inpatient Diabetes Coordinator [phone]

## 2022-07-13 NOTE — Discharge Summary (Signed)
Physician Discharge Summary  Nicholas Knight JYN:[id number] DOB: [birth year] DOA: 07/09/2022  PCP: Nicholas Patience, DO  Admit date: 07/09/2022 Discharge date: 07/13/2022  Time spent: 60 minutes  Recommendations for Outpatient Follow-up:  Follow-up with Nicholas Knight, [name], DO in 2 weeks.  On follow-up patient need a basic metabolic profile done to follow-up on electrolytes and renal function.  Patient will need medication assistance on follow-up.  Patient's diabetes will need to be reassessed and medications uptitrated as needed for better blood glucose control.  Hemoglobin A1c will need to be followed up upon as well as pending at time of discharge. Follow-up with primary cardiologist at heart failure clinic on 07/31/2022 at 11:30 AM.  On follow-up patient's nonischemic cardiomyopathy, medications will need to be reassessed and adjusted as deemed appropriate per primary cardiologist.  Patient will need a basic metabolic profile, magnesium level checked to follow-up on electrolytes and renal function. Follow-up at the [facility] as scheduled.   Discharge Diagnoses:  Principal Problem:   DKA (diabetic ketoacidosis) ([place]) Active Problems:   Hypertension   AKI (acute kidney injury) ([place])   DM2 (diabetes mellitus, type 2) ([place])   Nonischemic dilated cardiomyopathy ([place])   Hyperlipidemia   Heart failure with reduced ejection fraction ([place])   Diabetes mellitus without complication ([place])   Pseudohyponatremia   Hypotension   Dehydration   UTI (urinary tract infection)   Hypokalemia   Discharge Condition: Stable and improved.  Diet recommendation: Carb Modified diet.  Filed Weights   07/10/22 0829 07/11/22 0500 07/12/22 0500  Weight: 86 kg 86.6 kg 86.2 kg    History of present illness:  HPI per Dr. [name] is a 51 y.o. male with medical history significant of DM2, HTN, chronic HFrEF. Presenting with polydipsia and fatigue. He reports over the last 3 - 4 days he had felt generally  fatigued. He has had blurred vision. He had had unquenchable thirst and has been voiding excessively. He has had upset stomach and has been taking a lot of pepto bismol for it. He denies any abdominal pain, chest pain, dyspnea, fevers, sick contacts. When his symptoms did not improve today, he decided to come to the ED for evaluation. Of note, he has been out of his levemir for a year. He says this is due to him not having insurance. He has been taking metformin, but has also run out of that. He has recently acquired insurance through the [place] system and will start with his new PCP in January. He denies any other aggravating or alleviating factors.      Hospital Course:   Assessment and Plan: * DKA (diabetic ketoacidosis) ([place]) -Patient presented in DKA with polyuria, polydipsia, blurry vision, generalized fatigue, also noted to have some dysuria. -Urinalysis done on admission with glycosuria, moderate leukocytes, 100 protein, rare bacteria, WBC > 50, 80 ketones noted. -Comprehensive metabolic profile done with a sodium of 131, bicarb of 9, chloride of 93, glucose of 597, creatinine of 2.39 with a BUN of 39, anion gap of 29. -CBC with a leukocytosis. -Beta hydroxybutyrate was > 8. -Patient placed on the Endo tool with serial BMET, bowel rest. -DKA improved and anion gap closed.  Acidosis improved with bicarb at 26. -Patient transitioned evening of 07/10/2022 from Endo tool to Semglee 18 units daily.    -Hemoglobin A1c pending at time of discharge.  -Patient changed from [facility] to 70/30 16 units BID (due to insurance issues, more affordable) and dose adjusted to 25 units twice daily by day of  discharge. -Patient maintained on SSI.. -Urinalysis concerning for UTI, urine cultures with multiple species noted.  -Status post 3 days IV Rocephin and Pyridium with clinical improvement.  -EKG with no significant ischemic changes noted however patient presented in DKA, history of nonischemic cardiomyopathy,  systolic blood pressures in the 80s and as such cardiac enzymes were cycled which were negative.  -Diabetes coordinator consulted and following. -Outpatient follow-up with PCP.  Hypokalemia - Repleted during the hospitalization.    UTI (urinary tract infection) Urine culture with multiple species. -Status post 3 days of IV Rocephin.   -Received a 3-day course of Pyridium.   -No further antibiotics needed on discharge.  Dehydration -Hydrated with IV fluids. -Was euvolemic by day of discharge.    Hypotension ??  Etiology. -Patient noted the night of admission, 12 /3-23/ 8 to have systolic blood sugars in the mid to high 80s with a MAP of 55. -Patient with no overt bleeding, hemoglobin stable at 12.1 today. -Patient with a leukocytosis white count of 13.9 initially that resolved by day of discharge.   -Initial urinalysis concerning for UTI, patient also had symptoms of dysuria and placed empirically on IV Rocephin. -Urine cultures with multiple species present. -Blood cultures pending with no growth to date. -Chest x-ray no acute infiltrate. -Cardiac enzymes negative x 2. -2D echo with EF, 40 to 45%, left ventricular global wall hypokinesis -Status post fluid bolus, IV albumin with resolution of hypotension. -Patient completed 3-day course of IV Rocephin, hypotension improved and had resolved by day of discharge.   Pseudohyponatremia -Secondary to DKA/hyperglycemia. -Sodium at 136 by day of discharge.  Hyperlipidemia -Patient noted to have had a fasting lipid panel 10/17/2020 with a triglycerides of 171, HDL of 30, total cholesterol of 109, LDL of 50. -Repeat fasting lipid panel with total cholesterol 134, HDL of 21, LDL of 75, triglycerides of 192. -Patient maintained on home regimen atorvastatin 80 mg daily. -Outpatient follow-up with cardiology.  Nonischemic dilated cardiomyopathy ([place]) -Patient noted with a history of nonischemic dilated cardiomyopathy. -2D echo from  05/03/2020 with EF of 30 to 35%, left ventricular global hypokinesis, mild concentric LVH, grade 1 diastolic dysfunction. -Patient noted to have had a cardiac catheterization done 11/28/2018 with clean coronaries noted, dilated left ventricle with EF of 15%. -Patient presented in DKA, cardiac enzymes negative x 2.  -Repeat 2D echo done during this hospitalization, with EF of 40 to 45%, global hypokinesis of left ventricular wall, grade 1 diastolic dysfunction.  -Patient subsequently placed back on home regimen Coreg as blood pressure has improved.  -Lasix and Aldactone held during this hospitalization and will not be resumed on discharge until follow-up with primary cardiologist.   Delene [name] resumed at the lowest dose due to concerns for soft blood pressure.  -Outpatient follow-up with primary cardiologist, appointment has been set up for 07/31/2022.   -Patient given a 30-day coupon for [facility] on discharge and will need help with his cardiac medications on follow-up with cardiologist.   DM2 (diabetes mellitus, type 2) ([place]) -See DKA above. -Hemoglobin A1c pending.  -Patient noted to have been on long-acting insulin prior to admission in addition to metformin however due to insurance issues has been out of his long-acting insulin/Levemir for greater than a year. -DKA treated and resolved.   -Patient placed on 70/30 as it was more affordable dose uptitrated for better blood glucose control.   -Patient seen by diabetic coordinator during the hospitalization.   -Outpatient follow-up with PCP.   AKI (acute kidney injury) ([place]) -  Patient on presentation with acute renal failure with a creatinine of 2.39. -Likely secondary to prerenal azotemia as patient noted to be hypotensive with systolic blood pressures in the 80s also noted in DKA and also patient noted to be on Entresto prior to admission. -Renal function improved and normalized. -Patient placed back on Entresto at the lowest dose renal function  remained stable.   -Outpatient follow-up.   Hypertension -Patient with prior history of hypertension however patient with with hypotension initially on presentation which have improved with IV albumin. -Urinalysis concerning for possible UTI, urine cultures with multiple species noted. -Status post 3 days IV Rocephin. -Cardiac enzymes negative x 2. -Patient with no bleeding.  -Patient with low blood pressure 07/10/2022, however was alert, mentating well, noted to have some lightheadedness. -Patient with known ischemic cardiomyopathy, last EF on 2D echo was 30 to 44%, grade 1 diastolic dysfunction and as such need to monitor closely for volume overload and hesitant to give significant fluid boluses. -BP improved was no longer hypotensive after IV albumin given. -Coreg 25 mg twice daily resumed as well as Delene [name]. -Outpatient follow-up with PCP and cardiologist.        Procedures: Chest x-ray 07/10/2022 Echo 07/10/2022  Consultations: None  Discharge Exam: Vitals:   07/13/22 0356 07/13/22 1350  BP: 131/85 100/62  Pulse: 79 86  Resp: 17 20  Temp: 98 F (36.7 C) 98.3 F (36.8 C)  SpO2: 98% 96%    General: NAD Cardiovascular: RRR no murmurs rubs or gallops.  No JVD.  No lower extremity edema. Respiratory: Clear to auscultation bilaterally.  Discharge Instructions   Discharge Instructions     Diet Carb Modified   Complete by: As directed    1500cc/day fluid restriction   Increase activity slowly   Complete by: As directed       Allergies as of 07/13/2022       Reactions   Penicillins Other (See Comments)   Did it involve swelling of the face/tongue/throat, SOB, or low BP? N/A Did it involve sudden or severe rash/hives, skin peeling, or any reaction on the inside of your mouth or nose?N/A Did you need to seek medical attention at a hospital or doctor's office? N/A When did it last happen? Childhood    If all above answers are "NO", may proceed with cephalosporin  use. Other Reaction(s): Urticaria, Eruption of skin, Urticaria, Eruption of skin        Medication List     STOP taking these medications    Entresto 97-103 MG Generic drug: sacubitril-valsartan Replaced by: sacubitril-valsartan 24-26 MG   furosemide 20 MG tablet Commonly known as: LASIX   Jardiance 10 MG Tabs tablet Generic drug: empagliflozin   Levemir FlexTouch 100 UNIT/ML FlexPen Generic drug: insulin detemir   NON FORMULARY   spironolactone 25 MG tablet Commonly known as: ALDACTONE       TAKE these medications    atorvastatin 80 MG tablet Commonly known as: LIPITOR Take 1 tablet (80 mg total) by mouth daily at 6 PM.   B-D UF III MINI PEN NEEDLES 31G X 5 MM Misc Generic drug: Insulin Pen Needle Inject 25 Units into the skin 2 (two) times daily. USE TO INJECT INSULIN AT BED TIME What changed:  how much to take how to take this when to take this   BERBERINE HCI PO Take 1 capsule by mouth daily.   carvedilol 25 MG tablet Commonly known as: COREG Take 1 tablet (25 mg total) by mouth 2 (two)  times daily with a meal. LAST REFILL NEEDS APPOINTMENT   metFORMIN 1000 MG tablet Commonly known as: GLUCOPHAGE Take 1 tablet (1,000 mg total) by mouth 2 (two) times daily with a meal. What changed:  medication strength See the new instructions.   multivitamin Tabs tablet Take 1 tablet by mouth daily.   NovoLIN 70/30 Kwikpen (70-30) 100 UNIT/ML KwikPen Generic drug: insulin isophane & regular human KwikPen Inject 25 Units into the skin 2 (two) times daily.   pantoprazole 40 MG tablet Commonly known as: PROTONIX Take 1 tablet (40 mg total) by mouth daily. Start taking on: July 14, 2022   sacubitril-valsartan 24-26 MG Commonly known as: ENTRESTO Take 1 tablet by mouth 2 (two) times daily. Replaces: Entresto 97-103 MG       Allergies  Allergen Reactions   Penicillins Other (See Comments)    Did it involve swelling of the face/tongue/throat, SOB,  or low BP? N/A  Did it involve sudden or severe rash/hives, skin peeling, or any reaction on the inside of your mouth or nose?N/A  Did you need to seek medical attention at a hospital or doctor's office? N/A  When did it last happen? Childhood     If all above answers are "NO", may proceed with cephalosporin use.  Other Reaction(s): Urticaria, Eruption of skin, Urticaria, Eruption of skin    Follow-up Information     [facility] Follow up on 07/31/2022.   Specialty: Cardiology Why: Follow-up in the [facility] 07/31/22 at 1130am  Entrance C, free valet.  Please bring all medications with you Contact information: [address] [phone] [place]        Nicholas Patience, DO. Schedule an appointment as soon as possible for a visit in 2 week(s).   Specialty: Family Medicine Contact information: [name] [address] [phone]         [facility]   Why: Follow-up as scheduled.                 The results of significant diagnostics from this hospitalization (including imaging, microbiology, ancillary and laboratory) are listed below for reference.    Significant Diagnostic Studies: US RENAL  Result Date: 07/12/2022 CLINICAL DATA:  Acute kidney injury. EXAM: RENAL / URINARY TRACT ULTRASOUND COMPLETE COMPARISON:  None Available. FINDINGS: Right Kidney: Renal measurements: 12.8 x 5.4 x 5.2 cm = volume: 185.5 mL. Echogenicity within normal limits. No mass or hydronephrosis visualized. Left Kidney: Renal measurements: 11.9 x 6.0 x 5.2 cm = volume: 193.8 mL. Echogenicity within normal limits. No mass or hydronephrosis visualized. Bladder: Appears normal for degree of bladder distention. Other: None. IMPRESSION: Normal renal sonogram.  No hydronephrosis identified. Electronically Signed   By: Kerby Moors M.D.   On: 07/12/2022 11:36    ECHOCARDIOGRAM COMPLETE  Result Date: 07/10/2022    ECHOCARDIOGRAM REPORT   Patient Name:   Nicholas Knight Date of Exam: 07/10/2022 Medical Rec #:  [id number]      Height:       69.0 in Accession #:    [id number]     Weight:       181.0 lb Date of Birth:  12-22-1970      BSA:          1.980 m Patient Age:    89 years       BP:           111/70 mmHg Patient  Gender: M              HR:           89 bpm. Exam Location:  Inpatient Procedure: 2D Echo, Color Doppler and Cardiac Doppler Indications:    I42.9 Cardiomyopathy (unspecified)  History:        Patient has prior history of Echocardiogram examinations, most                 recent 05/03/2020. CHF; Risk Factors:Hypertension, Diabetes and                 Dyslipidemia.  Sonographer:    Raquel Sarna Senior RDCS Referring Phys: [place]  1. Left ventricular ejection fraction, by estimation, is 40 to 45%. The left ventricle has mildly decreased function. The left ventricle demonstrates global hypokinesis. Left ventricular diastolic parameters are consistent with Grade I diastolic dysfunction (impaired relaxation).  2. Right ventricular systolic function is normal. The right ventricular size is normal. There is normal pulmonary artery systolic pressure.  3. The mitral valve is normal in structure. Trivial mitral valve regurgitation. No evidence of mitral stenosis.  4. The aortic valve is tricuspid. Aortic valve regurgitation is not visualized. No aortic stenosis is present.  5. There is borderline dilatation of the aortic root, measuring 36 mm. There is borderline dilatation of the ascending aorta, measuring 36 mm.  6. The inferior vena cava is normal in size with greater than 50% respiratory variability, suggesting right atrial pressure of 3 mmHg. FINDINGS  Left Ventricle: Left ventricular ejection fraction, by estimation, is 40 to 45%. The left ventricle has mildly decreased function. The left ventricle demonstrates global hypokinesis. The left  ventricular internal cavity size was normal in size. There is  no left ventricular hypertrophy. Left ventricular diastolic parameters are consistent with Grade I diastolic dysfunction (impaired relaxation). Normal left ventricular filling pressure. Right Ventricle: The right ventricular size is normal. No increase in right ventricular wall thickness. Right ventricular systolic function is normal. There is normal pulmonary artery systolic pressure. The tricuspid regurgitant velocity is 2.38 m/s, and  with an assumed right atrial pressure of 3 mmHg, the estimated right ventricular systolic pressure is 03.5 mmHg. Left Atrium: Left atrial size was normal in size. Right Atrium: Right atrial size was normal in size. Pericardium: There is no evidence of pericardial effusion. Mitral Valve: The mitral valve is normal in structure. Trivial mitral valve regurgitation. No evidence of mitral valve stenosis. Tricuspid Valve: The tricuspid valve is normal in structure. Tricuspid valve regurgitation is trivial. No evidence of tricuspid stenosis. Aortic Valve: The aortic valve is tricuspid. Aortic valve regurgitation is not visualized. No aortic stenosis is present. Pulmonic Valve: The pulmonic valve was normal in structure. Pulmonic valve regurgitation is not visualized. No evidence of pulmonic stenosis. Aorta: The aortic root is normal in size and structure. There is borderline dilatation of the aortic root, measuring 36 mm. There is borderline dilatation of the ascending aorta, measuring 36 mm. Venous: The inferior vena cava is normal in size with greater than 50% respiratory variability, suggesting right atrial pressure of 3 mmHg. IAS/Shunts: No atrial level shunt detected by color flow Doppler.  LEFT VENTRICLE PLAX 2D LVIDd:         4.20 cm   Diastology LVIDs:         3.10 cm   LV e' medial:    7.18 cm/s LV PW:         1.00 cm   LV  E/e' medial:  5.3 LV IVS:        0.80 cm   LV e' lateral:   8.49 cm/s LVOT diam:     2.40 cm   LV  E/e' lateral: 4.4 LV SV:         63 LV SV Index:   32 LVOT Area:     4.52 cm  RIGHT VENTRICLE RV S prime:     12.90 cm/s TAPSE (M-mode): 1.9 cm LEFT ATRIUM           Index        RIGHT ATRIUM           Index LA diam:      3.40 cm 1.72 cm/m   RA Area:     10.90 cm LA Vol (A2C): 40.0 ml 20.20 ml/m  RA Volume:   20.10 ml  10.15 ml/m LA Vol (A4C): 40.8 ml 20.60 ml/m  AORTIC VALVE LVOT Vmax:   87.80 cm/s LVOT Vmean:  59.800 cm/s LVOT VTI:    0.138 m  AORTA Ao Root diam: 3.60 cm Ao Asc diam:  3.60 cm MITRAL VALVE               TRICUSPID VALVE MV Area (PHT): 2.94 cm    TR Peak grad:   22.7 mmHg MV Decel Time: 258 msec    TR Vmax:        238.00 cm/s MV E velocity: 37.70 cm/s MV A velocity: 57.00 cm/s  SHUNTS MV E/A ratio:  0.66        Systemic VTI:  0.14 m                            Systemic Diam: 2.40 cm Skeet Latch MD Electronically signed by Skeet Latch MD Signature Date/Time: 07/10/2022/3:43:38 PM    Final    DG CHEST PORT 1 VIEW  Result Date: 07/10/2022 CLINICAL DATA:  Leukocytosis EXAM: PORTABLE CHEST 1 VIEW COMPARISON:  Chest x-ray dated November 26, 2018 FINDINGS: Heart size and mediastinal contours are within normal limits for AP technique. Both lungs are clear. The visualized skeletal structures are unremarkable. IMPRESSION: No active disease. Electronically Signed   By: Yetta Glassman M.D.   On: 07/10/2022 09:26   DG Abd 1 View  Result Date: 07/09/2022 CLINICAL DATA:  Constipation.  Abdominal pain, nausea and vomiting. EXAM: ABDOMEN - 1 VIEW COMPARISON:  CT AP 07/29/2020 FINDINGS: Diffuse gaseous distension of the gastric lumen is identified. No dilated small bowel loops identified. Moderate stool burden is noted throughout the colon up to the level of the rectum. IMPRESSION: 1. Diffuse gaseous distension of the gastric lumen. 2. Moderate stool burden throughout the colon. Electronically Signed   By: Kerby Moors M.D.   On: 07/09/2022 17:56    Microbiology: Recent Results (from the  past 240 hour(s))  Urine Culture     Status: Abnormal   Collection Time: 07/09/22  3:44 PM   Specimen: Urine, Catheterized  Result Value Ref Range Status   Specimen Description   Final    URINE, CATHETERIZED Performed at [facility] [address], [place], [place] [address]    Special Requests   Final    NONE Performed at [facility], [place] [address], [place], [place] [address]    Culture MULTIPLE SPECIES PRESENT, SUGGEST RECOLLECTION (A)  Final   Report Status 07/11/2022 FINAL  Final  MRSA Next Gen by PCR, Nasal  Status: None   Collection Time: 07/10/22 12:49 AM   Specimen: Nasal Mucosa; Nasal Swab  Result Value Ref Range Status   MRSA by PCR Next Gen NOT DETECTED NOT DETECTED Final    Comment: (NOTE) The GeneXpert MRSA Assay (FDA approved for NASAL specimens only), is one component of a comprehensive MRSA colonization surveillance program. It is not intended to diagnose MRSA infection nor to guide or monitor treatment for MRSA infections. Test performance is not FDA approved in patients less than 2 years old. Performed at [facility], [place] [address], [place], [place] [address]   Culture, blood (Routine X 2) w Reflex to ID Panel     Status: None (Preliminary result)   Collection Time: 07/10/22  8:40 AM   Specimen: BLOOD LEFT FOREARM  Result Value Ref Range Status   Specimen Description   Final    BLOOD LEFT FOREARM Performed at [facility] [address], [place], [place] [address]    Special Requests   Final    BOTTLES DRAWN AEROBIC ONLY Blood Culture adequate volume Performed at [facility] [address] [place], [place] [address]    Culture   Final    NO GROWTH 3 DAYS Performed at [facility], [place] [address] [place], [place] [address]    Report Status PENDING  Incomplete  Culture, blood (Routine X 2) w Reflex to ID Panel     Status: None (Preliminary  result)   Collection Time: 07/10/22  8:40 AM   Specimen: BLOOD  Result Value Ref Range Status   Specimen Description   Final    BLOOD BLOOD LEFT HAND Performed at [facility] [address], [place], [place] [address]    Special Requests   Final    IN PEDIATRIC BOTTLE Blood Culture adequate volume Performed at [facility] [address] [place], [place] [address]    Culture   Final    NO GROWTH 3 DAYS Performed at [facility], [place] [address], [place], [place] [address]    Report Status PENDING  Incomplete     Labs: Basic Metabolic Panel: Recent Labs  Lab 07/10/22 1234 07/10/22 1645 07/11/22 0300 07/12/22 0257 07/13/22 0559  NA 139 141 139 141 136  K 3.4* 3.1* 3.5 3.1* 4.2  CL 112* 112* 111 109 104  CO2 19* 19* 20* 26 25  GLUCOSE 162* 168* 222* 129* 199*  BUN 22* 18 14 15 17   CREATININE 1.15 1.16 1.10 1.00 1.05  CALCIUM 8.4* 8.5* 8.2* 8.2* 8.6*  MG  --   --  1.7 2.4 1.8   Liver Function Tests: Recent Labs  Lab 07/09/22 1350 07/11/22 0300  AST 20 15  ALT 28 15  ALKPHOS 93 40  BILITOT 1.4* 0.9  PROT 7.8 5.3*  ALBUMIN 3.8 2.8*   Recent Labs  Lab 07/09/22 1350  LIPASE 50   No results for input(s): "AMMONIA" in the last 168 hours. CBC: Recent Labs  Lab 07/09/22 1350 07/10/22 0248 07/11/22 0300 07/12/22 0257 07/13/22 0559  WBC 13.6* 13.9* 7.0 6.1 5.5  NEUTROABS  --   --  4.4  --   --   HGB 18.7* 15.0 13.1 12.1* 12.6*  HCT 59.8* 45.5 39.0 36.1* 38.2*  MCV 85.1 80.8 80.2 79.9* 81.8  PLT 426* 278 227 201 206   Cardiac Enzymes: No results for input(s): "CKTOTAL", "CKMB", "CKMBINDEX", "TROPONINI" in the last 168 hours. BNP: BNP (last  3 results) No results for input(s): "BNP" in the last 8760 hours.  ProBNP (last 3 results) No results for input(s): "PROBNP" in the last 8760 hours.  CBG: Recent Labs  Lab 07/12/22 1144 07/12/22 1717 07/12/22 2109 07/13/22 0744 07/13/22 1117  GLUCAP 276* 317*  107* 200* 230*       Signed:  Irine Seal MD.  [facility] 07/13/2022, 2:44 PM

## 2022-07-14 ENCOUNTER — Telehealth: Payer: Self-pay

## 2022-07-14 LAB — HEMOGLOBIN A1C
Hgb A1c MFr Bld: >15.5 % — ABNORMAL HIGH (ref 4.8–5.6)
Mean Plasma Glucose: >398 mg/dL

## 2022-07-14 NOTE — Telephone Encounter (Signed)
Transition Care Management Follow-up Telephone Call Date of discharge and from where: [facility] 07/13/2022 How have you been since you were released from the hospital? Better  Any questions or concerns? No  Items Reviewed: Did the pt receive and understand the discharge instructions provided? Yes  Medications obtained and verified? Yes  Other? No  Any new allergies since your discharge? No  Dietary orders reviewed? Yes Do you have support at home? Yes   Home Care and Equipment/Supplies: Were home health services ordered? no If so, what is the name of the agency? N/a  Has the agency set up a time to come to the patient's home? not applicable Were any new equipment or medical supplies ordered?  No What is the name of the medical supply agency? N/a Were you able to get the supplies/equipment? not applicable Do you have any questions related to the use of the equipment or supplies? No  Functional Questionnaire: (I = Independent and D = Dependent) ADLs: I  Bathing/Dressing- I  Meal Prep- I  Eating- I  Maintaining continence- I  Transferring/Ambulation- I  Managing Meds- I  Follow up appointments reviewed:  PCP Hospital f/u appt confirmed? Yes  Scheduled to see Dr Oleh Genin on 07/20/2022 @ 11:30. [facility] f/u appt confirmed? Yes  Scheduled to see Cardiology on 07/31/2022 @ 11:30. Are transportation arrangements needed? No  If their condition worsens, is the pt aware to call PCP or go to the Emergency Dept.? Yes Was the patient provided with contact information for the PCP's office or ED? Yes Was to pt encouraged to call back with questions or concerns? Yes .lh

## 2022-07-15 LAB — CULTURE, BLOOD (ROUTINE X 2)
Culture: NO GROWTH
Culture: NO GROWTH
Special Requests: ADEQUATE
Special Requests: ADEQUATE

## 2022-07-20 ENCOUNTER — Inpatient Hospital Stay: Payer: No Typology Code available for payment source | Admitting: Family Medicine

## 2022-07-31 ENCOUNTER — Encounter (HOSPITAL_COMMUNITY): Payer: No Typology Code available for payment source

## 2022-07-31 NOTE — Progress Notes (Incomplete)
[facility]  Primary Cardiologist: Marl[facility] Por[facility] MD: Dr Haroldine Laws  HPI: [facility] is a 51 y.o. 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 Family history of HTN.   Presented with new onset HF in 4/20. EF 10-15%. Right and left heart catheterization in April 2020 showing normal coronary arteries.  Repeat echocardiogram 02/14/19 EF 20 to 25%.  Admitted 07/2022 with DKA and Urosepsis. He had been out of levemir x1 year because he didn't have insurance. Treated with antibiotics. Initially HF meds held. Arlyce Harman and lasix held. Placed on lowest dose of entresto. Echo showed EF 40-45%. RV normal.   Today he returns for HF follow up.Overall feeling fine. Denies SOB/PND/Orthopnea. Appetite ok. No fever or chills. Weight at home  pounds. Taking all medications  Past Medical History:  Diagnosis Date   CHF (congestive heart failure) (HCC)    Diabetes mellitus type 2 in obese ([place]) 09/12/2015   Diabetic ketoacidosis ([place]) 08/2019   Exertional dyspnea    Hypertension    Nonischemic dilated cardiomyopathy (HCC)     Current Outpatient Medications  Medication Sig Dispense Refill   atorvastatin (LIPITOR) 80 MG tablet Take 1 tablet (80 mg total) by mouth daily at 6 PM. 90 tablet 1   Berberine Chloride (BERBERINE HCI PO) Take 1 capsule by mouth daily.     carvedilol (COREG) 25 MG tablet Take 1 tablet (25 mg total) by mouth 2 (two) times daily with a meal. LAST REFILL NEEDS APPOINTMENT 60 tablet 1   insulin isophane & regular human KwikPen (NOVOLIN 70/30 KWIKPEN) (70-30) 100 UNIT/ML KwikPen Inject 25 Units into the skin 2 (two) times daily. 15 mL 1   Insulin Pen Needle (B-D UF III MINI PEN NEEDLES) 31G X 5 MM MISC Inject 25 Units into the skin 2 (two) times daily. USE TO INJECT INSULIN AT BED TIME 100 each 2   metFORMIN (GLUCOPHAGE) 1000 MG tablet Take 1 tablet (1,000 mg total) by mouth 2 (two) times daily with a meal.  60 tablet 1   multivitamin (ONE-A-DAY MEN'S) TABS tablet Take 1 tablet by mouth daily.     pantoprazole (PROTONIX) 40 MG tablet Take 1 tablet (40 mg total) by mouth daily. 30 tablet 1   sacubitril-valsartan (ENTRESTO) 24-26 MG Take 1 tablet by mouth 2 (two) times daily. 60 tablet 1   No current facility-administered medications for this visit.    Allergies  Allergen Reactions   Penicillins Other (See Comments)    Did it involve swelling of the face/tongue/throat, SOB, or low BP? N/A  Did it involve sudden or severe rash/hives, skin peeling, or any reaction on the inside of your mouth or nose?N/A  Did you need to seek medical attention at a hospital or doctor's office? N/A  When did it last happen? Childhood     If all above answers are "NO", may proceed with cephalosporin use.  Other Reaction(s): Urticaria, Eruption of skin, Urticaria, Eruption of skin      Social History   Socioeconomic History   Marital status: Single    Spouse name: Not on file   Number of children: Not on file   Years of education: Not on file   Highest education level: Not on file  Occupational History   Not on file  Tobacco Use   Smoking status: Never   Smokeless tobacco: Never  Vaping Use   Vaping Use: Never used  Substance and  Sexual Activity   Alcohol use: No   Drug use: No   Sexual activity: Yes  Other Topics Concern   Not on file  Social History Narrative   Not on file   Social Determinants of Health   Financial Resource Strain: Not on file  Food Insecurity: Unknown (07/10/2022)   Hunger Vital Sign    Worried About Running Out of Food in the Last Year: Patient refused    [place] in the Last Year: Patient refused  Transportation Needs: No Transportation Needs (07/10/2022)   PRAPARE - Hydrologist (Medical): No    Lack of Transportation (Non-Medical): No  Physical Activity: Not on file  Stress: Not on file  Social Connections: Not on file   Intimate Partner Violence: Not At Risk (07/10/2022)   Humiliation, Afraid, Rape, and Kick questionnaire    Fear of Current or Ex-Partner: No    Emotionally Abused: No    Physically Abused: No    Sexually Abused: No      Family History  Problem Relation Age of Onset   Cancer Mother    Lupus Maternal Grandmother    There were no vitals filed for this visit. Wt Readings from Last 3 Encounters:  07/12/22 86.2 kg (190 lb 0.6 oz)  10/17/20 93.4 kg (205 lb 12.8 oz)  05/14/20 89.8 kg (198 lb)     PHYSICAL EXAM: General:  Well appearing. No resp difficulty HEENT: normal Neck: supple. no JVD. Carotids 2+ bilat; no bruits. No lymphadenopathy or thryomegaly appreciated. Cor: PMI nondisplaced. Regular rate & rhythm. No rubs, gallops or murmurs. Lungs: clear Abdomen: soft, nontender, nondistended. No hepatosplenomegaly. No bruits or masses. Good bowel sounds. Extremities: no cyanosis, clubbing, rash, edema Neuro: alert & orientedx3, cranial nerves grossly intact. moves all 4 extremities w/o difficulty. Affect pleasant     ASSESSMENT & PLAN:  1. Chronic systolic HF due to NICM - onset 4/20. EF 15-20% cath with normal coronaries - echo 7/20 EF 20-25% - Echo 2023 EF 40-45%.   No SGLT2i with recent UTI.  -  2. HTN:   3. DM2. Followed by PCP -Consider future addition of an SGLT2i    Follow up with Dr Haroldine Laws in 3-4 months if EF remains low

## 2024-02-18 ENCOUNTER — Encounter: Payer: Self-pay | Admitting: Advanced Practice Midwife
